# Patient Record
Sex: Male | Born: 1970 | Race: White | Hispanic: No | Marital: Married | Smoking: Current every day smoker
Health system: Southern US, Community
[De-identification: ages and names within clinical notes are randomized; demographics above are authoritative.]

## PROBLEM LIST (undated history)

## (undated) DIAGNOSIS — K7031 Alcoholic cirrhosis of liver with ascites: Principal | ICD-10-CM

## (undated) DIAGNOSIS — K439 Ventral hernia without obstruction or gangrene: Secondary | ICD-10-CM

## (undated) DIAGNOSIS — K430 Incisional hernia with obstruction, without gangrene: Secondary | ICD-10-CM

## (undated) DIAGNOSIS — R140 Abdominal distension (gaseous): Secondary | ICD-10-CM

## (undated) DIAGNOSIS — S83206A Unspecified tear of unspecified meniscus, current injury, right knee, initial encounter: Secondary | ICD-10-CM

## (undated) DIAGNOSIS — K219 Gastro-esophageal reflux disease without esophagitis: Secondary | ICD-10-CM

## (undated) DIAGNOSIS — G8929 Other chronic pain: Secondary | ICD-10-CM

## (undated) DIAGNOSIS — M549 Dorsalgia, unspecified: Secondary | ICD-10-CM

## (undated) DIAGNOSIS — K589 Irritable bowel syndrome without diarrhea: Secondary | ICD-10-CM

## (undated) DIAGNOSIS — N200 Calculus of kidney: Secondary | ICD-10-CM

## (undated) HISTORY — PX: LEG SURGERY: SHX1003

## (undated) HISTORY — PX: WISDOM TOOTH EXTRACTION: SHX21

## (undated) HISTORY — PX: LITHOTRIPSY: SUR834

---

## 2011-12-30 ENCOUNTER — Emergency Department (HOSPITAL_BASED_OUTPATIENT_CLINIC_OR_DEPARTMENT_OTHER)
Admission: EM | Admit: 2011-12-30 | Discharge: 2011-12-30 | Disposition: A | Discharge status: Home / Self Care | Payer: Self-pay | Attending: Emergency Medicine | Admitting: Emergency Medicine

## 2011-12-30 ENCOUNTER — Encounter (HOSPITAL_BASED_OUTPATIENT_CLINIC_OR_DEPARTMENT_OTHER): Payer: Self-pay | Admitting: *Deleted

## 2011-12-30 DIAGNOSIS — F17200 Nicotine dependence, unspecified, uncomplicated: Secondary | ICD-10-CM | POA: Insufficient documentation

## 2011-12-30 DIAGNOSIS — Z77098 Contact with and (suspected) exposure to other hazardous, chiefly nonmedicinal, chemicals: Secondary | ICD-10-CM | POA: Insufficient documentation

## 2011-12-30 MED ORDER — TETRACAINE HCL 0.5 % OP SOLN
OPHTHALMIC | Status: AC
  Administered 2011-12-30: 19:00:00
  Filled 2011-12-30: qty 2

## 2011-12-30 MED ORDER — OXYCODONE-ACETAMINOPHEN 5-325 MG PO TABS: 2.0000 | ORAL_TABLET | ORAL | Status: DC | PRN

## 2011-12-30 MED ORDER — FLUORESCEIN SODIUM 1 MG OP STRP
ORAL_STRIP | OPHTHALMIC | Status: AC
  Administered 2011-12-30: 18:00:00
  Filled 2011-12-30: qty 1

## 2011-12-30 MED ORDER — OXYCODONE-ACETAMINOPHEN 5-325 MG PO TABS: 2.0000 | ORAL_TABLET | ORAL | Status: AC | PRN

## 2011-12-30 MED ORDER — TOBRAMYCIN 0.3 % OP SOLN: 1.0000 [drp] | OPHTHALMIC | Status: AC

## 2011-12-30 MED ORDER — FLUORESCEIN SODIUM 1 MG OP STRP
ORAL_STRIP | OPHTHALMIC | Status: AC
  Administered 2011-12-30: 20:00:00
  Filled 2011-12-30: qty 1

## 2011-12-30 MED ORDER — TETRACAINE HCL 0.5 % OP SOLN
OPHTHALMIC | Status: AC
  Administered 2011-12-30: 18:00:00 via OPHTHALMIC
  Filled 2011-12-30: qty 2

## 2011-12-30 MED ORDER — OXYCODONE-ACETAMINOPHEN 5-325 MG PO TABS
1.0000 | ORAL_TABLET | Freq: Once | ORAL | Status: AC
Start: 2011-12-30 — End: 2011-12-30
  Administered 2011-12-30: 1 via ORAL
  Filled 2011-12-30 (×2): qty 1

## 2011-12-30 NOTE — ED Notes (Signed)
Patient's left eye being flushed with morgan lens and one liter NaCl 0.9%

## 2011-12-30 NOTE — ED Notes (Signed)
Pt. Eating and states he feels much better after pain med. Still with c/o mild  blurred vision to left eye.

## 2011-12-30 NOTE — ED Notes (Signed)
Eye irrigation complete. PA aware and request another visual acuity. In progress per the EMT. Tolerated procedure well.
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
## 2011-12-30 NOTE — ED Notes (Signed)
PA at bedside.

## 2011-12-30 NOTE — ED Notes (Signed)
MD at bedside. 

## 2011-12-30 NOTE — ED Notes (Signed)
Pa @ bs.
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
## 2011-12-31 NOTE — ED Provider Notes (Signed)
Medical screening examination/treatment/procedure(s) were conducted as a shared visit with non-physician practitioner(s) and myself.  I personally evaluated the patient during the encounter.    Obtained hx and performed eye exam.  No FBs noted.  No corneal abrasions noted on slit lamp exam or with fluorescein staining.  Discussed his evaluation with the on-call eye specialist.  He will follow-up in < 24hrs for further evaluation.  Tobin Chad, MD 12/31/11 [phone]

## 2012-01-29 ENCOUNTER — Emergency Department (HOSPITAL_BASED_OUTPATIENT_CLINIC_OR_DEPARTMENT_OTHER)
Admission: EM | Admit: 2012-01-29 | Discharge: 2012-01-29 | Disposition: A | Discharge status: Home / Self Care | Payer: Self-pay | Attending: Emergency Medicine | Admitting: Emergency Medicine

## 2012-01-29 ENCOUNTER — Emergency Department (HOSPITAL_BASED_OUTPATIENT_CLINIC_OR_DEPARTMENT_OTHER): Payer: Self-pay

## 2012-01-29 ENCOUNTER — Encounter (HOSPITAL_BASED_OUTPATIENT_CLINIC_OR_DEPARTMENT_OTHER): Payer: Self-pay

## 2012-01-29 DIAGNOSIS — Z880 Allergy status to penicillin: Secondary | ICD-10-CM | POA: Insufficient documentation

## 2012-01-29 DIAGNOSIS — Y9355 Activity, bike riding: Secondary | ICD-10-CM | POA: Insufficient documentation

## 2012-01-29 DIAGNOSIS — S92309A Fracture of unspecified metatarsal bone(s), unspecified foot, initial encounter for closed fracture: Secondary | ICD-10-CM | POA: Insufficient documentation

## 2012-01-29 DIAGNOSIS — F17200 Nicotine dependence, unspecified, uncomplicated: Secondary | ICD-10-CM | POA: Insufficient documentation

## 2012-01-29 MED ORDER — OXYCODONE-ACETAMINOPHEN 5-325 MG PO TABS
2.0000 | ORAL_TABLET | Freq: Once | ORAL | Status: AC
  Administered 2012-01-29: 2 via ORAL
  Filled 2012-01-29 (×2): qty 2

## 2012-01-29 MED ORDER — OXYCODONE-ACETAMINOPHEN 5-325 MG PO TABS
1.0000 | ORAL_TABLET | Freq: Four times a day (QID) | ORAL | Status: DC | PRN
Start: 2012-01-29 — End: 2012-05-11

## 2012-01-29 MED ORDER — HYDROCODONE-ACETAMINOPHEN 5-325 MG PO TABS
1.0000 | ORAL_TABLET | Freq: Four times a day (QID) | ORAL | Status: DC | PRN
Start: 2012-01-29 — End: 2012-01-29

## 2012-01-29 MED ORDER — HYDROCODONE-ACETAMINOPHEN 5-325 MG PO TABS
1.0000 | ORAL_TABLET | Freq: Once | ORAL | Status: DC
  Filled 2012-01-29: qty 1
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
## 2012-03-31 ENCOUNTER — Emergency Department (HOSPITAL_BASED_OUTPATIENT_CLINIC_OR_DEPARTMENT_OTHER)
Admission: EM | Admit: 2012-03-31 | Discharge: 2012-03-31 | Disposition: A | Discharge status: Home / Self Care | Payer: Self-pay | Attending: Emergency Medicine | Admitting: Emergency Medicine

## 2012-03-31 ENCOUNTER — Encounter (HOSPITAL_BASED_OUTPATIENT_CLINIC_OR_DEPARTMENT_OTHER): Payer: Self-pay | Admitting: *Deleted

## 2012-03-31 DIAGNOSIS — Z0289 Encounter for other administrative examinations: Secondary | ICD-10-CM | POA: Insufficient documentation

## 2012-03-31 DIAGNOSIS — S92909A Unspecified fracture of unspecified foot, initial encounter for closed fracture: Secondary | ICD-10-CM

## 2012-03-31 DIAGNOSIS — K219 Gastro-esophageal reflux disease without esophagitis: Secondary | ICD-10-CM | POA: Insufficient documentation

## 2012-03-31 DIAGNOSIS — M549 Dorsalgia, unspecified: Secondary | ICD-10-CM | POA: Insufficient documentation

## 2012-03-31 DIAGNOSIS — M2540 Effusion, unspecified joint: Secondary | ICD-10-CM | POA: Insufficient documentation

## 2012-03-31 DIAGNOSIS — G8929 Other chronic pain: Secondary | ICD-10-CM | POA: Insufficient documentation

## 2012-03-31 DIAGNOSIS — Z8719 Personal history of other diseases of the digestive system: Secondary | ICD-10-CM | POA: Insufficient documentation

## 2012-03-31 DIAGNOSIS — Z79899 Other long term (current) drug therapy: Secondary | ICD-10-CM | POA: Insufficient documentation

## 2012-03-31 DIAGNOSIS — F17200 Nicotine dependence, unspecified, uncomplicated: Secondary | ICD-10-CM | POA: Insufficient documentation

## 2012-03-31 HISTORY — DX: Dorsalgia, unspecified: M54.9

## 2012-03-31 HISTORY — DX: Other chronic pain: G89.29

## 2012-03-31 HISTORY — DX: Gastro-esophageal reflux disease without esophagitis: K21.9

## 2012-03-31 HISTORY — DX: Irritable bowel syndrome, unspecified: K58.9

## 2012-03-31 NOTE — ED Provider Notes (Signed)
History     CSN: [id number]  Arrival date & time 03/31/12  1001   First MD Initiated Contact with Patient 03/31/12 1025      Chief Complaint  Patient presents with  . Letter for School/Work    (Consider location/radiation/quality/duration/timing/severity/associated sxs/prior treatment) HPI Comments: Patient presents status post a left foot fracture stating that he needs a note to go back to work. He states he was seen here on October 15 and diagnosed with metatarsal fractures. He was placed in a postop boot and was instructed to follow up with orthopedics. However he states that orthopedics within $400 to see him and he was not able to get an appointment with him. He states that he's been wearing the boot until this past Saturday. He states that since she's taken the boot off he feels like his foot is doing fine. He denies any pain on ambulation. He denies any swelling to the foot. He feels like it's completely healed. He denies any numbness in the foot. He states that his job needs a note he can go back to work.   Past Medical History  Diagnosis Date  . Acid reflux   . IBS (irritable bowel syndrome)   . Chronic back pain     History reviewed. No pertinent past surgical history.  History reviewed. No pertinent family history.  History  Substance Use Topics  . Smoking status: Current Every Day Smoker  . Smokeless tobacco: Not on file  . Alcohol Use: No      Review of Systems  Constitutional: Negative for fever.  HENT: Negative for neck pain.   Respiratory: Negative.   Cardiovascular: Negative.   Gastrointestinal: Negative for nausea and vomiting.  Musculoskeletal: Positive for joint swelling. Negative for back pain.  Skin: Negative for wound.  Neurological: Negative for headaches.    Allergies  Penicillins and Vicodin  Home Medications   Current Outpatient Rx  Name  Route  Sig  Dispense  Refill  . ESOMEPRAZOLE MAGNESIUM 40 MG PO CPDR   Oral   Take 40 mg by  mouth daily before breakfast.         . IBUPROFEN 200 MG PO TABS   Oral   Take 200 mg by mouth every 6 (six) hours as needed. For headache.         . OXYCODONE HCL ER 10 MG PO TB12   Oral   Take 10 mg by mouth every 12 (twelve) hours.         . OXYCODONE-ACETAMINOPHEN 5-325 MG PO TABS   Oral   Take 1-2 tablets by mouth every 6 (six) hours as needed for pain.   20 tablet   0     BP 142/90  Pulse 83  Temp 97.9 F (36.6 C) (Oral)  Resp 18  Ht 6\' 1"  (1.854 m)  Wt 276 lb (125.193 kg)  BMI 36.41 kg/m2  SpO2 98%  Physical Exam  Constitutional: He is oriented to person, place, and time. He appears well-developed and well-nourished.  HENT:  Head: Normocephalic and atraumatic.  Eyes: Pupils are equal, round, and reactive to light.  Neck: Normal range of motion. Neck supple.  Cardiovascular: Normal rate, regular rhythm and normal heart sounds.   Pulmonary/Chest: Effort normal and breath sounds normal. No respiratory distress. He has no wheezes. He has no rales. He exhibits no tenderness.  Abdominal: Soft. Bowel sounds are normal. There is no tenderness. There is no rebound and no guarding.  Musculoskeletal: Normal range of motion.  He exhibits no edema.       Patient has no swelling noted to the foot. He has no pain on palpation or range of motion of the foot. He has normal pulses in the feet. He has normal sensation and motor function in the foot.  Lymphadenopathy:    He has no cervical adenopathy.  Neurological: He is alert and oriented to person, place, and time.  Skin: Skin is warm and dry. No rash noted.  Psychiatric: He has a normal mood and affect.    ED Course  Procedures (including critical care time)  Labs Reviewed - No data to display No results found.   1. Foot fracture       MDM  Patient is here for a reevaluation after being  diagnosed with metatarsal fractures. He appears to be doing well and has no pain with ambulation or ongoing swelling. I did  give him a note he can go back to work. I advised him that he'll need followup with his primary care physician if his symptoms start returning or if he has any ongoing problems with this foot. He has a primary care physician in [facility].        Rolan Bucco, MD 03/31/12 1056
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
## 2012-05-11 ENCOUNTER — Emergency Department (HOSPITAL_COMMUNITY): Payer: Self-pay

## 2012-05-11 ENCOUNTER — Encounter (HOSPITAL_COMMUNITY): Payer: Self-pay | Admitting: Emergency Medicine

## 2012-05-11 ENCOUNTER — Emergency Department (HOSPITAL_COMMUNITY)
Admission: EM | Admit: 2012-05-11 | Discharge: 2012-05-11 | Disposition: A | Discharge status: Home / Self Care | Payer: Self-pay | Attending: Emergency Medicine | Admitting: Emergency Medicine

## 2012-05-11 DIAGNOSIS — Z8719 Personal history of other diseases of the digestive system: Secondary | ICD-10-CM | POA: Insufficient documentation

## 2012-05-11 DIAGNOSIS — S32010A Wedge compression fracture of first lumbar vertebra, initial encounter for closed fracture: Secondary | ICD-10-CM

## 2012-05-11 DIAGNOSIS — Z79899 Other long term (current) drug therapy: Secondary | ICD-10-CM | POA: Insufficient documentation

## 2012-05-11 DIAGNOSIS — M7918 Myalgia, other site: Secondary | ICD-10-CM

## 2012-05-11 DIAGNOSIS — F17200 Nicotine dependence, unspecified, uncomplicated: Secondary | ICD-10-CM | POA: Insufficient documentation

## 2012-05-11 DIAGNOSIS — W108XXA Fall (on) (from) other stairs and steps, initial encounter: Secondary | ICD-10-CM | POA: Insufficient documentation

## 2012-05-11 DIAGNOSIS — S32009A Unspecified fracture of unspecified lumbar vertebra, initial encounter for closed fracture: Secondary | ICD-10-CM | POA: Insufficient documentation

## 2012-05-11 DIAGNOSIS — Y9389 Activity, other specified: Secondary | ICD-10-CM | POA: Insufficient documentation

## 2012-05-11 DIAGNOSIS — G8929 Other chronic pain: Secondary | ICD-10-CM | POA: Insufficient documentation

## 2012-05-11 DIAGNOSIS — M549 Dorsalgia, unspecified: Secondary | ICD-10-CM | POA: Insufficient documentation

## 2012-05-11 DIAGNOSIS — R51 Headache: Secondary | ICD-10-CM | POA: Insufficient documentation

## 2012-05-11 DIAGNOSIS — Y9289 Other specified places as the place of occurrence of the external cause: Secondary | ICD-10-CM | POA: Insufficient documentation

## 2012-05-11 DIAGNOSIS — K219 Gastro-esophageal reflux disease without esophagitis: Secondary | ICD-10-CM | POA: Insufficient documentation

## 2012-05-11 MED ORDER — PROMETHAZINE HCL 25 MG PO TABS: 25.0000 mg | ORAL_TABLET | Freq: Four times a day (QID) | ORAL | Status: DC | PRN

## 2012-05-11 MED ORDER — HYDROMORPHONE HCL PF 1 MG/ML IJ SOLN
1.0000 mg | Freq: Once | INTRAMUSCULAR | Status: AC
  Administered 2012-05-11: 1 mg via INTRAVENOUS
  Filled 2012-05-11: qty 1

## 2012-05-11 MED ORDER — OXYCODONE HCL 5 MG PO TABS: 5.0000 mg | ORAL_TABLET | ORAL | Status: DC | PRN

## 2012-05-11 MED ORDER — ONDANSETRON HCL 4 MG/2ML IJ SOLN
4.0000 mg | Freq: Once | INTRAMUSCULAR | Status: AC
  Administered 2012-05-11: 4 mg via INTRAVENOUS
  Filled 2012-05-11: qty 2

## 2012-05-11 MED ORDER — PROMETHAZINE HCL 25 MG/ML IJ SOLN
12.5000 mg | Freq: Once | INTRAMUSCULAR | Status: DC
  Filled 2012-05-11 (×2): qty 1

## 2012-05-11 MED ORDER — HYDROMORPHONE HCL PF 1 MG/ML IJ SOLN
0.5000 mg | Freq: Once | INTRAMUSCULAR | Status: AC
  Administered 2012-05-11: 0.5 mg via INTRAVENOUS
  Filled 2012-05-11: qty 1

## 2012-05-11 NOTE — ED Notes (Signed)
Pt returned from CT and XR.

## 2012-05-11 NOTE — ED Notes (Signed)
Patient transported to X-ray 
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
## 2012-05-11 NOTE — ED Notes (Signed)
Patient transported to CT 

## 2012-05-11 NOTE — ED Notes (Signed)
Pt arrived by EMS with c/o fall down 6 steps and landed on carpet. Pt c/o right shoulder, arm, back and neck pain. Pt stated that he passed out after hitting the floor. EMS VS: BP-124/92 HR-84 RESP-16 [id number]. Pt was nauseated en route and EMS admin Zofran 4mg  IV.

## 2012-05-12 NOTE — ED Provider Notes (Signed)
Medical screening examination/treatment/procedure(s) were performed by non-physician practitioner and as supervising physician I was immediately available for consultation/collaboration.   Benny Lennert, MD 05/12/12 1236

## 2013-09-13 ENCOUNTER — Emergency Department (HOSPITAL_BASED_OUTPATIENT_CLINIC_OR_DEPARTMENT_OTHER): Payer: Medicaid Other

## 2013-09-13 ENCOUNTER — Encounter (HOSPITAL_BASED_OUTPATIENT_CLINIC_OR_DEPARTMENT_OTHER): Payer: Self-pay | Admitting: Emergency Medicine

## 2013-09-13 ENCOUNTER — Emergency Department (HOSPITAL_BASED_OUTPATIENT_CLINIC_OR_DEPARTMENT_OTHER)
Admission: EM | Admit: 2013-09-13 | Discharge: 2013-09-13 | Disposition: A | Discharge status: Home / Self Care | Payer: Medicaid Other | Attending: Emergency Medicine | Admitting: Emergency Medicine

## 2013-09-13 DIAGNOSIS — G8929 Other chronic pain: Secondary | ICD-10-CM | POA: Insufficient documentation

## 2013-09-13 DIAGNOSIS — Z79899 Other long term (current) drug therapy: Secondary | ICD-10-CM | POA: Insufficient documentation

## 2013-09-13 DIAGNOSIS — Z880 Allergy status to penicillin: Secondary | ICD-10-CM | POA: Insufficient documentation

## 2013-09-13 DIAGNOSIS — Y9289 Other specified places as the place of occurrence of the external cause: Secondary | ICD-10-CM | POA: Insufficient documentation

## 2013-09-13 DIAGNOSIS — Y9389 Activity, other specified: Secondary | ICD-10-CM | POA: Insufficient documentation

## 2013-09-13 DIAGNOSIS — M25469 Effusion, unspecified knee: Secondary | ICD-10-CM | POA: Insufficient documentation

## 2013-09-13 DIAGNOSIS — M2390 Unspecified internal derangement of unspecified knee: Secondary | ICD-10-CM

## 2013-09-13 DIAGNOSIS — M549 Dorsalgia, unspecified: Secondary | ICD-10-CM | POA: Insufficient documentation

## 2013-09-13 DIAGNOSIS — F17200 Nicotine dependence, unspecified, uncomplicated: Secondary | ICD-10-CM | POA: Insufficient documentation

## 2013-09-13 DIAGNOSIS — X500XXA Overexertion from strenuous movement or load, initial encounter: Secondary | ICD-10-CM | POA: Insufficient documentation

## 2013-09-13 DIAGNOSIS — S8990XA Unspecified injury of unspecified lower leg, initial encounter: Secondary | ICD-10-CM | POA: Insufficient documentation

## 2013-09-13 DIAGNOSIS — S99919A Unspecified injury of unspecified ankle, initial encounter: Principal | ICD-10-CM

## 2013-09-13 DIAGNOSIS — S99929A Unspecified injury of unspecified foot, initial encounter: Principal | ICD-10-CM

## 2013-09-13 DIAGNOSIS — K219 Gastro-esophageal reflux disease without esophagitis: Secondary | ICD-10-CM | POA: Insufficient documentation

## 2013-09-13 MED ORDER — HYDROMORPHONE HCL PF 1 MG/ML IJ SOLN
1.0000 mg | Freq: Once | INTRAMUSCULAR | Status: DC
  Filled 2013-09-13: qty 1

## 2013-09-13 MED ORDER — IBUPROFEN 800 MG PO TABS: 800.0000 mg | ORAL_TABLET | Freq: Three times a day (TID) | ORAL | Status: DC

## 2013-09-13 MED ORDER — IBUPROFEN 800 MG PO TABS
800.0000 mg | ORAL_TABLET | Freq: Once | ORAL | Status: AC
  Administered 2013-09-13: 800 mg via ORAL
  Filled 2013-09-13: qty 1

## 2013-09-13 NOTE — ED Provider Notes (Signed)
CSN: [id number]     Arrival date & time 09/13/13  2018 History  This chart was scribed for Peter Octave, MD by Modena Jansky, ED Scribe. This patient was seen in room MH03/MH03 and the patient's care was started at 9:02 PM.  Chief Complaint  Patient presents with  . Knee Pain   The history is provided by the patient. No language interpreter was used.   HPI Comments: Peter Leon is a 43 y.o. male who presents to the Emergency Department complaining of intermittent, gradually worsening, severe left knee pain with associated swelling that started a week ago. Pt reports twisting left leg on ladder. He describes the pain as stabbing, and is worsened by palpation and movement. He has taken Roxicodone10 mg, which he takes for chronic back pain,  with no relief. He has also tried ice packs and massage with no relief. He also intermittent numbness in his right foot. Pt has had remote hx of right knee patellar dislocation in the past and previous ACL repair. Denies headache, back pain, neck pain, and LOC.   Past Medical History  Diagnosis Date  . Acid reflux   . IBS (irritable bowel syndrome)   . Chronic back pain    Past Surgical History  Procedure Laterality Date  . Leg surgery      compound fx    History reviewed. No pertinent family history. History  Substance Use Topics  . Smoking status: Current Every Day Smoker -- 1.00 packs/day  . Smokeless tobacco: Not on file  . Alcohol Use: No    Review of Systems A complete 10 system review of systems was obtained and all systems are negative except as noted in the HPI and PMH.   Allergies  Penicillins; Acetaminophen; Morphine and related; and Vicodin  Home Medications   Prior to Admission medications   Medication Sig Start Date End Date Taking? Authorizing Provider  esomeprazole (NEXIUM) 40 MG capsule Take 40 mg by mouth daily before breakfast.    Historical Provider, MD  ibuprofen (ADVIL,MOTRIN) 800 MG tablet Take 1 tablet (800 mg  total) by mouth 3 (three) times daily. 09/13/13   Peter Octave, MD  oxyCODONE (OXYCONTIN) 10 MG 12 hr tablet Take 10 mg by mouth every 12 (twelve) hours.    Historical Provider, MD  oxyCODONE (ROXICODONE) 5 MG immediate release tablet Take 1 tablet (5 mg total) by mouth every 4 (four) hours as needed for pain. 05/11/12   Nicole Pisciotta, PA-C  promethazine (PHENERGAN) 25 MG tablet Take 1 tablet (25 mg total) by mouth every 6 (six) hours as needed for nausea. 05/11/12   Nicole Pisciotta, PA-C  vitamin B-12 (CYANOCOBALAMIN) 100 MCG tablet Take 100 mcg by mouth daily.    Historical Provider, MD   BP 146/96  Pulse 66  Temp(Src) 98.8 F (37.1 C) (Oral)  Resp 16  Wt 260 lb (117.935 kg)  SpO2 98% Physical Exam  Nursing note and vitals reviewed. Constitutional: He is oriented to person, place, and time. He appears well-developed and well-nourished. No distress.  Angry disposition, texting on phone when I entered the room, continues to text even after I asked him to stop.   HENT:  Head: Normocephalic and atraumatic.  Eyes: Conjunctivae and EOM are normal. Pupils are equal, round, and reactive to light.  Neck: Normal range of motion. Neck supple. No tracheal deviation present.  Cardiovascular: Normal rate, regular rhythm, normal heart sounds and intact distal pulses.   No murmur heard. Pulmonary/Chest: Effort normal. No respiratory distress.  Abdominal: Soft. There is no tenderness. There is no rebound.  Musculoskeletal: He exhibits edema and tenderness.  Right knee- no erythema. Small effusion. Reduced ROM secondary to pain. Flexion and extension are intact. Tenderness over anterior medial joint line. Achilles intact. No ligament laxity. +2 DP and PT pulses.   Neurological: He is alert and oriented to person, place, and time. No cranial nerve deficit. He exhibits normal muscle tone. Coordination normal.  Skin: Skin is warm and dry.  Psychiatric: He has a normal mood and affect. His behavior is  normal.    ED Course  Procedures (including critical care time) DIAGNOSTIC STUDIES: Oxygen Saturation is 98% on RA, normal by my interpretation.    COORDINATION OF CARE: 9:06 PM- Pt advised of plan for treatment which include motrin and knee X-ray and pt agrees.  Labs Review Labs Reviewed - No data to display  Imaging Review Dg Knee Complete 4 Views Right  09/13/2013   CLINICAL DATA:  Twisted right knee 1 week ago.  Pain and swelling.  EXAM: RIGHT KNEE - COMPLETE 4+ VIEW  COMPARISON:  None.  FINDINGS: No fracture. The knee joint is normally spaced and aligned with no significant arthropathic change.  Moderate joint effusion.  There is a piece of metal wire in the posterior soft tissues of the distal thigh.  IMPRESSION: No fracture dislocation. Moderate joint effusion suggests an internal derangement. Recommend followup MRI.  Small metal piece of wire in the posterior soft tissues, which is likely chronic. This is not a contraindication to MRI.   Electronically Signed   By: Amie Portlandavid  Ormond M.D.   On: 09/13/2013 21:06     EKG Interpretation None      MDM   Final diagnoses:  Knee internal derangement   Right knee pain and swelling after twisting it one week ago while getting off a ladder. He is able to ambulate. He takes chronic oxycodone for back pain. States this is not helping. Denies any fever or vomiting.  On exam patient has an effusion with reduced range of motion. There is no warmth or erythema. Flexion and extension are intact. Tenderness over the anterior medial joint line.  There is no warmth or erythema. Patient is afebrile. Doubt septic joint. his range of motion is intact but limited.  X-ray shows moderate joint effusion. Patient will need MRI for evaluation of internal derangement. He declines pain medication in the ED. He has his chronic pain medication at home. Follow up with sports medicine, knee immobilizer and crutches given.  I personally performed the services  described in this documentation, which was scribed in my presence. The recorded information has been reviewed and is accurate.   Peter OctaveStephen Adreonna Yontz, MD 09/13/13 [phone]

## 2013-09-13 NOTE — Discharge Instructions (Signed)
Knee Effusion Follow up with Dr. Pearletha Forge. Take your pain medications as prescribed. Return to the ED if you develop new or worsening symptoms,.  Knee effusion means you have fluid in your knee. The knee may be more difficult to bend and move. HOME CARE  Use crutches or a brace as told by your doctor.  Put ice on the injured area.  Put ice in a plastic bag.  Place a towel between your skin and the bag.  Leave the ice on for 15-20 minutes, 03-04 times a day.  Raise (elevate) your knee as much as possible.  Only take medicine as told by your doctor.  You may need to do strengthening exercises. Ask your doctor.  Continue with your normal diet and activities as told by your doctor. GET HELP RIGHT AWAY IF:  You have more puffiness (swelling) in your knee.  You see redness, puffiness, or have more pain in your knee.  You have a temperature by mouth above 102 F (38.9 C).  You get a rash.  You have trouble breathing.  You have a reaction to any medicine you are taking.  You have a lot of pain when you move your knee. MAKE SURE YOU:  Understand these instructions.  Will watch your condition.  Will get help right away if you are not doing well or get worse. Document Released: 05/05/2010 Document Revised: 06/25/2011 Document Reviewed: 05/05/2010 [facility] Patient Information 2014 [place], [place].

## 2013-09-13 NOTE — ED Notes (Signed)
D/c home with family- ice pack given for home use 

## 2013-09-13 NOTE — ED Notes (Signed)
Pt reports he twisted right knee one week ago pain  and swelling has progressively increased

## 2013-09-13 NOTE — ED Notes (Addendum)
Patient stated he was told that he will also get a knee sleeve under knee immobilizer so that he can go to work. I applied same, large knee sleeve, then a knee immobilizer. I fit crutches to patient and had him demonstrate proper use. Patient did well with no assistance. Comfort improved.

## 2013-11-17 ENCOUNTER — Emergency Department (HOSPITAL_COMMUNITY): Payer: Medicaid Other

## 2013-11-17 ENCOUNTER — Encounter (HOSPITAL_COMMUNITY): Payer: Self-pay | Admitting: Emergency Medicine

## 2013-11-17 ENCOUNTER — Emergency Department (HOSPITAL_COMMUNITY)
Admission: EM | Admit: 2013-11-17 | Discharge: 2013-11-17 | Disposition: A | Discharge status: Home / Self Care | Payer: Medicaid Other | Attending: Emergency Medicine | Admitting: Emergency Medicine

## 2013-11-17 DIAGNOSIS — G8911 Acute pain due to trauma: Secondary | ICD-10-CM | POA: Insufficient documentation

## 2013-11-17 DIAGNOSIS — G8929 Other chronic pain: Secondary | ICD-10-CM | POA: Diagnosis not present

## 2013-11-17 DIAGNOSIS — M25561 Pain in right knee: Secondary | ICD-10-CM

## 2013-11-17 DIAGNOSIS — F17200 Nicotine dependence, unspecified, uncomplicated: Secondary | ICD-10-CM | POA: Insufficient documentation

## 2013-11-17 DIAGNOSIS — Z8781 Personal history of (healed) traumatic fracture: Secondary | ICD-10-CM | POA: Diagnosis not present

## 2013-11-17 DIAGNOSIS — K219 Gastro-esophageal reflux disease without esophagitis: Secondary | ICD-10-CM | POA: Insufficient documentation

## 2013-11-17 DIAGNOSIS — T1490XA Injury, unspecified, initial encounter: Secondary | ICD-10-CM

## 2013-11-17 DIAGNOSIS — Z880 Allergy status to penicillin: Secondary | ICD-10-CM | POA: Diagnosis not present

## 2013-11-17 DIAGNOSIS — Z79899 Other long term (current) drug therapy: Secondary | ICD-10-CM | POA: Diagnosis not present

## 2013-11-17 DIAGNOSIS — M25569 Pain in unspecified knee: Secondary | ICD-10-CM | POA: Insufficient documentation

## 2013-11-17 DIAGNOSIS — M25469 Effusion, unspecified knee: Secondary | ICD-10-CM | POA: Diagnosis not present

## 2013-11-17 NOTE — ED Notes (Signed)
Pt hurt knee several weeks ago, was seen at [facility] after accident. Pt states pain and swelling has gotten worse. Pt ambulatory. R medial knee swollen.

## 2013-11-17 NOTE — ED Provider Notes (Signed)
CSN: [id number]     Arrival date & time 11/17/13  1532 History   None   This chart was scribed for non-physician practitioner, Junius Finner, working with Rolland Porter, MD by Marica Otter, ED Scribe. This patient was seen in room WTR7/WTR7 and the patient's care was started at 4:44 PM.  Chief Complaint  Patient presents with  . Knee Injury   HPI HPI Comments: Peter Leon is a 43 y.o. male, with a Hx of chronic pain (though pt notes that he does not take any pain meds presently), ACL tear, and leg surgery for a compound fracture, who presents to the Emergency Department complaining of worsening pain and swelling to his right knee which he injured back in May 2015. Pt rates his current pain a 5 out of 10. PT reports that he has been applying ice and heat to the affected area. Pt denies following up with an orthopedist as he gas been "working a lot". Pt denies any Hx of being shot with a BB gun or other known injury with metalic objects.. Denies new injuries.    Pt is a current everyday smoker who smokes 1 ppd.    Past Medical History  Diagnosis Date  . Acid reflux   . IBS (irritable bowel syndrome)   . Chronic back pain    Past Surgical History  Procedure Laterality Date  . Leg surgery      compound fx    History reviewed. No pertinent family history. History  Substance Use Topics  . Smoking status: Current Every Day Smoker -- 1.00 packs/day  . Smokeless tobacco: Not on file  . Alcohol Use: No    Review of Systems  Musculoskeletal:       Right knee pain and swelling   All other systems reviewed and are negative.     Allergies  Penicillins; Acetaminophen; Morphine and related; and Vicodin  Home Medications   Prior to Admission medications   Medication Sig Start Date End Date Taking? Authorizing Provider  esomeprazole (NEXIUM) 40 MG capsule Take 40 mg by mouth daily before breakfast.   Yes Historical Provider, MD  ibuprofen (ADVIL,MOTRIN) 800 MG tablet Take 1 tablet (800  mg total) by mouth 3 (three) times daily. 09/13/13  Yes [name], MD  naproxen sodium (ANAPROX) 220 MG tablet Take 440 mg by mouth every 4 (four) hours as needed (knee pain.).   Yes Historical Provider, MD  promethazine (PHENERGAN) 25 MG tablet Take 1 tablet (25 mg total) by mouth every 6 (six) hours as needed for nausea. 05/11/12  Yes Nicole Pisciotta, PA-C  vitamin B-12 (CYANOCOBALAMIN) 100 MCG tablet Take 100 mcg by mouth daily.   Yes Historical Provider, MD   Triage Vitals: BP 109/80  Pulse 79  Temp(Src) 98.3 F (36.8 C) (Oral)  Resp 16  SpO2 93% Physical Exam  Nursing note and vitals reviewed. Constitutional: He is oriented to person, place, and time. He appears well-developed and well-nourished.  HENT:  Head: Normocephalic and atraumatic.  Eyes: EOM are normal.  Neck: Normal range of motion.  Cardiovascular: Normal rate.   Pulses:      Dorsalis pedis pulses are 2+ on the right side.       Posterior tibial pulses are 2+ on the right side.  Pulmonary/Chest: Effort normal.  Musculoskeletal: Normal range of motion. He exhibits edema and tenderness.  Mild to moderate edema of right knee, greater edema in medial aspect of right knee. FROM. Tenderness over medial aspect right knee over MCL  and medial joint space.  No masses palpated.   Neurological: He is alert and oriented to person, place, and time.  Sensation in tact distal to injury.  Skin: Skin is warm and dry.  Psychiatric: He has a normal mood and affect. His behavior is normal.    ED Course  Procedures (including critical care time) DIAGNOSTIC STUDIES: Oxygen Saturation is 93% on RA, adequate by my interpretation.    COORDINATION OF CARE: 4:48 PM-Discussed treatment plan which includes ortho referral with pt at bedside and pt agreed to plan.   Labs Review Labs Reviewed - No data to display  Imaging Review Dg Knee Complete 4 Views Right  11/17/2013   CLINICAL DATA:  Twisting injury, knee pain.  EXAM: RIGHT KNEE -  COMPLETE 4+ VIEW  COMPARISON:  Right knee radiograph Sep 13, 2013  FINDINGS: No acute fracture deformity or dislocation. Joint space intact without erosions. No destructive bony lesions. Again seen is a small wire fragment within the posterior distal femoral soft tissues. Small suprapatellar joint effusion.  IMPRESSION: No acute fracture deformity or dislocation. Small suprapatellar joint effusion, decreased.  Wire fragment projecting in the posterior oral distal femoral soft tissues, unchanged.   Electronically Signed   By: Awilda Metroourtnay  Bloomer   On: 11/17/2013 16:06     EKG Interpretation None      MDM   Final diagnoses:  Pain of right knee after injury    Pt is a 43yo male presenting to ED for further evaluation and treatment of continued right knee pain due to injury on 09/13/13.  Pt has not f/u with orthopedics as previously suggested. Today, pt requesting referral as symptoms have not improved. Denies new injuries. Right leg is neurovascularly in tact. Will discharge pt home to f/u with Dr. Sherlean FootLucey, orthopedics, for further evaluation and treatment of right knee injury.  Due to persistence of symptoms and edema, concern for ligamentous injury that may require surgical repair. Return precautions provided. Pt verbalized understanding and agreement with tx plan.   I personally performed the services described in this documentation, which was scribed in my presence. The recorded information has been reviewed and is accurate.    Junius Finnerrin O'Malley, PA-C 11/17/13 1943

## 2013-11-19 NOTE — ED Provider Notes (Signed)
Medical screening examination/treatment/procedure(s) were performed by non-physician practitioner and as supervising physician I was immediately available for consultation/collaboration.   EKG Interpretation None        Rolland PorterMark Keaundra Stehle, MD 11/19/13 [phone]

## 2014-09-14 ENCOUNTER — Encounter (HOSPITAL_COMMUNITY): Payer: Self-pay | Admitting: Emergency Medicine

## 2014-09-14 ENCOUNTER — Emergency Department (HOSPITAL_COMMUNITY): Payer: Medicaid Other

## 2014-09-14 ENCOUNTER — Emergency Department (HOSPITAL_COMMUNITY)
Admission: EM | Admit: 2014-09-14 | Discharge: 2014-09-14 | Disposition: A | Discharge status: Home / Self Care | Payer: Medicaid Other | Attending: Emergency Medicine | Admitting: Emergency Medicine

## 2014-09-14 DIAGNOSIS — Z720 Tobacco use: Secondary | ICD-10-CM | POA: Diagnosis not present

## 2014-09-14 DIAGNOSIS — K219 Gastro-esophageal reflux disease without esophagitis: Secondary | ICD-10-CM | POA: Diagnosis not present

## 2014-09-14 DIAGNOSIS — W11XXXA Fall on and from ladder, initial encounter: Secondary | ICD-10-CM | POA: Insufficient documentation

## 2014-09-14 DIAGNOSIS — Z79899 Other long term (current) drug therapy: Secondary | ICD-10-CM | POA: Insufficient documentation

## 2014-09-14 DIAGNOSIS — S8991XA Unspecified injury of right lower leg, initial encounter: Secondary | ICD-10-CM | POA: Insufficient documentation

## 2014-09-14 DIAGNOSIS — G8929 Other chronic pain: Secondary | ICD-10-CM | POA: Diagnosis not present

## 2014-09-14 DIAGNOSIS — Y998 Other external cause status: Secondary | ICD-10-CM | POA: Diagnosis not present

## 2014-09-14 DIAGNOSIS — M25461 Effusion, right knee: Secondary | ICD-10-CM

## 2014-09-14 DIAGNOSIS — Y9389 Activity, other specified: Secondary | ICD-10-CM | POA: Diagnosis not present

## 2014-09-14 DIAGNOSIS — Z880 Allergy status to penicillin: Secondary | ICD-10-CM | POA: Insufficient documentation

## 2014-09-14 DIAGNOSIS — Y9289 Other specified places as the place of occurrence of the external cause: Secondary | ICD-10-CM | POA: Diagnosis not present

## 2014-09-14 MED ORDER — IBUPROFEN 200 MG PO TABS
600.0000 mg | ORAL_TABLET | Freq: Once | ORAL | Status: AC
  Administered 2014-09-14: 600 mg via ORAL
  Filled 2014-09-14: qty 3

## 2014-09-14 MED ORDER — OXYCODONE HCL 5 MG PO TABS
5.0000 mg | ORAL_TABLET | Freq: Once | ORAL | Status: AC
  Administered 2014-09-14: 5 mg via ORAL
  Filled 2014-09-14: qty 1

## 2014-09-14 NOTE — ED Provider Notes (Signed)
CSN: [id number]     Arrival date & time 09/14/14  2009 History  This chart was scribed for a non-physician practitioner, Earley Favor, NP working with Lorre Nick, MD by Evon Slack, ED Scribe. This patient was seen in room WTR8/WTR8 and the patient's care was started at 9:54 PM.      Chief Complaint  Patient presents with  . Knee Injury    right   The history is provided by the patient. No language interpreter was used.   HPI Comments: Peter Leon is a 44 y.o. male who presents to the Emergency Department complaining of right knee injury onset today. Pt has associated swelling. Pt states that he fell of of a 6 foot ladder landing on his right foot and twisted his knee. He states that he heard the knee poop. Pt states that he has tried ibuprofen with no relief. Pt states that the pain is worse when ambulating or bearing weight. Pt denies numbness or other related symptoms.   Past Medical History  Diagnosis Date  . Acid reflux   . IBS (irritable bowel syndrome)   . Chronic back pain    Past Surgical History  Procedure Laterality Date  . Leg surgery      compound fx    History reviewed. No pertinent family history. History  Substance Use Topics  . Smoking status: Current Every Day Smoker -- 1.00 packs/day  . Smokeless tobacco: Not on file  . Alcohol Use: No    Review of Systems  Musculoskeletal: Positive for arthralgias.  Neurological: Negative for numbness.  All other systems reviewed and are negative.    Allergies  Penicillins; Acetaminophen; Morphine and related; and Vicodin  Home Medications   Prior to Admission medications   Medication Sig Start Date End Date Taking? Authorizing Provider  esomeprazole (NEXIUM) 40 MG capsule Take 40 mg by mouth daily before breakfast.   Yes Historical Provider, MD  ibuprofen (ADVIL,MOTRIN) 800 MG tablet Take 1 tablet (800 mg total) by mouth 3 (three) times daily. 09/13/13  Yes [name], MD  oxyCODONE (ROXICODONE) 15 MG  immediate release tablet Take 15 mg by mouth every 6 (six) hours as needed for pain (pain).   Yes Historical Provider, MD  vitamin B-12 (CYANOCOBALAMIN) 100 MCG tablet Take 100 mcg by mouth daily.   Yes Historical Provider, MD  promethazine (PHENERGAN) 25 MG tablet Take 1 tablet (25 mg total) by mouth every 6 (six) hours as needed for nausea. Patient not taking: Reported on 09/14/2014 05/11/12   Joni Reining Pisciotta, PA-C   BP 131/83 mmHg  Pulse 79  Temp(Src) 98.3 F (36.8 C) (Oral)  Resp 16  SpO2 100%   Physical Exam  Constitutional: He is oriented to person, place, and time. He appears well-developed and well-nourished. No distress.  HENT:  Head: Normocephalic and atraumatic.  Eyes: Conjunctivae and EOM are normal.  Neck: Neck supple. No tracheal deviation present.  Cardiovascular: Normal rate.   Pulmonary/Chest: Effort normal. No respiratory distress.  Musculoskeletal: Normal range of motion.  Neurological: He is alert and oriented to person, place, and time.  Skin: Skin is warm and dry.  Psychiatric: He has a normal mood and affect. His behavior is normal.  Nursing note and vitals reviewed.   ED Course  Procedures (including critical care time) DIAGNOSTIC STUDIES: Oxygen Saturation is 100% on RA, normal by my interpretation.    COORDINATION OF CARE: 10:33 PM-Discussed treatment plan with pt at bedside and pt agreed to plan.  Labs Review Labs Reviewed - No data to display  Imaging Review Dg Tibia/fibula Right  09/14/2014   CLINICAL DATA:  Right leg pain after injury. Pain after fall at ladder at work earlier today. Lateral pain radiating down the leg.  EXAM: RIGHT TIBIA AND FIBULA - 2 VIEW  COMPARISON:  None.  FINDINGS: Cortical margins of the tibia and fibula are intact. There is no fracture. Ankle alignment is maintained. No focal soft tissue abnormality.  IMPRESSION: No fracture of the right tibia/fibula.   Electronically Signed   By: Rubye OaksMelanie  Ehinger M.D.   On:  09/14/2014 22:41   Dg Knee Complete 4 Views Right  09/14/2014   CLINICAL DATA:  Right knee pain after injury. Fall off ladder earlier today at work. Lateral pain radiating down leg. Unable to bear weight.  EXAM: RIGHT KNEE - COMPLETE 4+ VIEW  COMPARISON:  11/17/2013  FINDINGS: No fracture or dislocation. The alignment is maintained. There is minimal medial tibial femoral joint space narrowing and peripheral spurring. Previous joint effusion has diminished. Unchanged 10 mm linear metallic density in the posterior distal thigh, likely small wire fragment.  IMPRESSION: 1. No fracture or dislocation. 2. 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   Electronically Signed   By: Rubye OaksMelanie  Ehinger M.D.   On: 09/14/2014 22:41     EKG Interpretation None     Is no fracture or subluxation.  Patient has been placed in a knee immobilizer, given crutches for his home medication.  It shows that he takes 15 mg of oxycodone every 6 hours for pain MDM   Final diagnoses:  Knee effusion, right  Fall from ladder, initial encounter      I personally performed the services described in this documentation, which was scribed in my presence. The recorded information has been reviewed and is accurate.     Earley FavorGail Aileen Amore, NP 09/14/14 2316  Lorre NickAnthony Allen, MD 09/14/14 [phone]

## 2014-09-14 NOTE — ED Notes (Signed)
Pt fell 6 feet off a ladder and injured right knee and tib/fib area today. Having severe medial right knee pain. No obvious deformity. Took 800 mg Ibuprofen around 1800 today. Walks with a limp. No other c/c.

## 2014-09-14 NOTE — Discharge Instructions (Signed)
°Knee Effusion °The medical term for having fluid in your knee is effusion. This is often due to an internal derangement of the knee. This means something is wrong inside the knee. Some of the causes of fluid in the knee may be torn cartilage, a torn ligament, or bleeding into the joint from an injury. Your knee is likely more difficult to bend and move. This is often because there is increased pain and pressure in the joint. The time it takes for recovery from a knee effusion depends on different factors, including:  °· Type of injury. °· Your age. °· Physical and medical conditions. °· Rehabilitation Strategies. °How long you will be away from your normal activities will depend on what kind of knee problem you have and how much damage is present. Your knee has two types of cartilage. Articular cartilage covers the bone ends and lets your knee bend and move smoothly. Two menisci, thick pads of cartilage that form a rim inside the joint, help absorb shock and stabilize your knee. Ligaments bind the bones together and support your knee joint. Muscles move the joint, help support your knee, and take stress off the joint itself. °CAUSES  °Often an effusion in the knee is caused by an injury to one of the menisci. This is often a tear in the cartilage. Recovery after a meniscus injury depends on how much meniscus is damaged and whether you have damaged other knee tissue. Small tears may heal on their own with conservative treatment. Conservative means rest, limited weight bearing activity and muscle strengthening exercises. Your recovery may take up to 6 weeks.  °TREATMENT  °Larger tears may require surgery. Meniscus injuries may be treated during arthroscopy. Arthroscopy is a procedure in which your surgeon uses a small telescope like instrument to look in your knee. Your caregiver can make a more accurate diagnosis (learning what is wrong) by performing an arthroscopic procedure. °If your injury is on the inner  margin of the meniscus, your surgeon may trim the meniscus back to a smooth rim. In other cases your surgeon will try to repair a damaged meniscus with stitches (sutures). This may make rehabilitation take longer, but may provide better long term result by helping your knee keep its shock absorption capabilities. °Ligaments which are completely torn usually require surgery for repair. °HOME CARE INSTRUCTIONS °· Use crutches as instructed. °· If a brace is applied, use as directed. °· Once you are home, an ice pack applied to your swollen knee may help with discomfort and help decrease swelling. °· Keep your knee raised (elevated) when you are not up and around or on crutches. °· Only take over-the-counter or prescription medicines for pain, discomfort, or fever as directed by your caregiver. °· Your caregivers will help with instructions for rehabilitation of your knee. This often includes strengthening exercises. °· You may resume a normal diet and activities as directed. °SEEK MEDICAL CARE IF:  °· There is increased swelling in your knee. °· You notice redness, swelling, or increasing pain in your knee. °· An unexplained oral temperature above 102° F (38.9° C) develops. °SEEK IMMEDIATE MEDICAL CARE IF:  °· You develop a rash. °· You have difficulty breathing. °· You have any allergic reactions from medications you may have been given. °· There is severe pain with any motion of the knee. °MAKE SURE YOU:  °· Understand these instructions. °· Will watch your condition. °· Will get help right away if you are not doing well or get worse. °  Document Released: 06/23/2003 Document Revised: 06/25/2011 Document Reviewed: 08/27/2007 [facility] Patient Information 2015 [place], [place]. This information is not intended to replace advice given to you by your health care provider. Make sure you discuss any questions you have with your health care provider. Her x-ray shows that you have a joint effusion but no fracture or  dislocation.  Even placed in a knee splint or immobilizer for comfort, crutches and pain medication with follow-up by orthopedic surgery

## 2014-09-17 ENCOUNTER — Other Ambulatory Visit: Payer: Self-pay | Admitting: Orthopedic Surgery

## 2014-09-17 DIAGNOSIS — M25561 Pain in right knee: Secondary | ICD-10-CM

## 2014-09-21 ENCOUNTER — Ambulatory Visit
Admission: RE | Admit: 2014-09-21 | Discharge: 2014-09-21 | Disposition: A | Discharge status: Home / Self Care | Payer: Medicaid Other | Source: Ambulatory Visit | Attending: Orthopedic Surgery | Admitting: Orthopedic Surgery

## 2014-09-21 ENCOUNTER — Other Ambulatory Visit: Payer: Medicaid Other

## 2014-09-21 DIAGNOSIS — M25561 Pain in right knee: Secondary | ICD-10-CM

## 2014-09-28 ENCOUNTER — Other Ambulatory Visit (HOSPITAL_COMMUNITY): Payer: Self-pay | Admitting: Orthopedic Surgery

## 2014-09-29 ENCOUNTER — Encounter (HOSPITAL_COMMUNITY): Payer: Self-pay | Admitting: *Deleted

## 2014-09-29 NOTE — Progress Notes (Signed)
Pt denies SOB, chest pain, and being under the care of a cardiologist. Pt denies having a chest x ray and EKG within the last year. Pt denies having a stress test, echo and cardiac cath. Pt made aware to stop  taking Aspirin, otc vitamins and herbal medications. Do not take any NSAIDs ie: Ibuprofen, Advil, Naproxen or any medication containing Aspirin. Pt verbalized understanding of all pre-op instructions. 

## 2014-09-29 NOTE — H&P (Signed)
Peter Leon is an 44 y.o. male.   Chief Complaint: Right knee pain HPI: Peter Leon is a 44 year old male with several month history of right knee pain following an injury. He was coming off a ladder and had a twisting injury with pop in the knee since that MRI scanning demonstrated medial meniscal tear and no fracture. He reports that continued pain on medial aspect of his knee along with mechanical symptoms and swelling. He is failed conservative course of management including injection and activity modification is very physical in his line of work. No family history of DVT or pulmonary embolism  Past Medical History  Diagnosis Date  . Acid reflux   . IBS (irritable bowel syndrome)   . Chronic back pain   . Right knee meniscal tear     medial  . Kidney stones     Past Surgical History  Procedure Laterality Date  . Leg surgery      compound fx   . Wisdom tooth extraction    . Lithotripsy      History reviewed. No pertinent family history. Social History:  reports that he has been smoking Cigarettes.  He has been smoking about 0.50 packs per day. He has never used smokeless tobacco. He reports that he uses illicit drugs (Marijuana). He reports that he does not drink alcohol.  Allergies:  Allergies  Allergen Reactions  . Morphine And Related Anaphylaxis and Rash  . Penicillins Anaphylaxis and Hives  . Vicodin [Hydrocodone-Acetaminophen] Hives  . Acetaminophen Rash    No prescriptions prior to admission    No results found for this or any previous visit (from the past 48 hour(s)). No results found.  Review of Systems  Constitutional: Negative.   HENT: Negative.   Eyes: Negative.   Respiratory: Negative.   Cardiovascular: Negative.   Gastrointestinal: Negative.   Genitourinary: Negative.   Musculoskeletal: Positive for joint pain.  Skin: Negative.   Neurological: Negative.   Endo/Heme/Allergies: Negative.   Psychiatric/Behavioral: Negative.     There were no  vitals taken for this visit. Physical Exam  Constitutional: He is oriented to person, place, and time. He appears well-developed.  HENT:  Head: Normocephalic.  Eyes: Pupils are equal, round, and reactive to light.  Neck: Normal range of motion.  Cardiovascular: Normal rate.   Respiratory: Effort normal.  Neurological: He is alert and oriented to person, place, and time.  Skin: Skin is warm.   examination of the right knee demonstrates medial joint line tenderness positive work compression testing stable collateral crucial ligament palpable pedal pulses good range of motion intact since mechanism positive McMurray compression testing no other masses lymphadenopathy or skin changes noted in the right knee region  Assessment/Plan Impression is right knee medial meniscal tear following trauma this tear is refractory to nonoperative management plan arthroscopy debridement risk and benefits discussed with the patient would not limited to knee stiffness incomplete pain relief infection potential need for more surgery all questions answered plan this as an outpatient procedure  Chanice Brenton SCOTT 09/29/2014, 10:37 PM

## 2014-09-30 ENCOUNTER — Ambulatory Visit (HOSPITAL_COMMUNITY): Payer: Medicaid Other | Admitting: Anesthesiology

## 2014-09-30 ENCOUNTER — Encounter (HOSPITAL_COMMUNITY): Payer: Self-pay | Admitting: *Deleted

## 2014-09-30 ENCOUNTER — Encounter (HOSPITAL_COMMUNITY)
Admission: RE | Disposition: A | Discharge status: Home / Self Care | Payer: Self-pay | Source: Ambulatory Visit | Attending: Orthopedic Surgery

## 2014-09-30 ENCOUNTER — Ambulatory Visit (HOSPITAL_COMMUNITY)
Admission: RE | Admit: 2014-09-30 | Discharge: 2014-09-30 | Disposition: A | Discharge status: Home / Self Care | Payer: Medicaid Other | Source: Ambulatory Visit | Attending: Orthopedic Surgery | Admitting: Orthopedic Surgery

## 2014-09-30 DIAGNOSIS — G8929 Other chronic pain: Secondary | ICD-10-CM | POA: Diagnosis not present

## 2014-09-30 DIAGNOSIS — Z886 Allergy status to analgesic agent status: Secondary | ICD-10-CM | POA: Diagnosis not present

## 2014-09-30 DIAGNOSIS — Z79899 Other long term (current) drug therapy: Secondary | ICD-10-CM | POA: Insufficient documentation

## 2014-09-30 DIAGNOSIS — K219 Gastro-esophageal reflux disease without esophagitis: Secondary | ICD-10-CM | POA: Diagnosis not present

## 2014-09-30 DIAGNOSIS — X58XXXA Exposure to other specified factors, initial encounter: Secondary | ICD-10-CM | POA: Insufficient documentation

## 2014-09-30 DIAGNOSIS — Z888 Allergy status to other drugs, medicaments and biological substances status: Secondary | ICD-10-CM | POA: Diagnosis not present

## 2014-09-30 DIAGNOSIS — K589 Irritable bowel syndrome without diarrhea: Secondary | ICD-10-CM | POA: Insufficient documentation

## 2014-09-30 DIAGNOSIS — F17210 Nicotine dependence, cigarettes, uncomplicated: Secondary | ICD-10-CM | POA: Insufficient documentation

## 2014-09-30 DIAGNOSIS — Z87442 Personal history of urinary calculi: Secondary | ICD-10-CM | POA: Diagnosis not present

## 2014-09-30 DIAGNOSIS — M25561 Pain in right knee: Secondary | ICD-10-CM | POA: Insufficient documentation

## 2014-09-30 DIAGNOSIS — Z880 Allergy status to penicillin: Secondary | ICD-10-CM | POA: Diagnosis not present

## 2014-09-30 DIAGNOSIS — M2241 Chondromalacia patellae, right knee: Secondary | ICD-10-CM | POA: Diagnosis not present

## 2014-09-30 DIAGNOSIS — S83241A Other tear of medial meniscus, current injury, right knee, initial encounter: Secondary | ICD-10-CM | POA: Diagnosis not present

## 2014-09-30 HISTORY — PX: KNEE ARTHROSCOPY WITH MEDIAL MENISECTOMY: SHX5651

## 2014-09-30 HISTORY — DX: Calculus of kidney: N20.0

## 2014-09-30 HISTORY — DX: Unspecified tear of unspecified meniscus, current injury, right knee, initial encounter: S83.206A

## 2014-09-30 LAB — COMPREHENSIVE METABOLIC PANEL
ALT: 410 U/L — ABNORMAL HIGH (ref 17–63)
AST: 295 U/L — AB (ref 15–41)
Albumin: 3.3 g/dL — ABNORMAL LOW (ref 3.5–5.0)
Alkaline Phosphatase: 95 U/L (ref 38–126)
Anion gap: 6 (ref 5–15)
BUN: 9 mg/dL (ref 6–20)
CALCIUM: 9 mg/dL (ref 8.9–10.3)
CO2: 27 mmol/L (ref 22–32)
Chloride: 104 mmol/L (ref 101–111)
Creatinine, Ser: 0.99 mg/dL (ref 0.61–1.24)
GFR calc non Af Amer: >60 mL/min (ref >60)
Glucose, Bld: 112 mg/dL — ABNORMAL HIGH (ref 65–99)
Potassium: 4.2 mmol/L (ref 3.5–5.1)
Sodium: 137 mmol/L (ref 135–145)
TOTAL PROTEIN: 6.5 g/dL (ref 6.5–8.1)
Total Bilirubin: 1.1 mg/dL (ref 0.3–1.2)

## 2014-09-30 LAB — CBC
HCT: 44.8 % (ref 39.0–52.0)
HEMOGLOBIN: 15.6 g/dL (ref 13.0–17.0)
MCH: 29.3 pg (ref 26.0–34.0)
MCHC: 34.8 g/dL (ref 30.0–36.0)
MCV: 84.1 fL (ref 78.0–100.0)
PLATELETS: 195 10*3/uL (ref 150–400)
RBC: 5.33 MIL/uL (ref 4.22–5.81)
RDW: 14.2 % (ref 11.5–15.5)
WBC: 6.6 10*3/uL (ref 4.0–10.5)

## 2014-09-30 SURGERY — ARTHROSCOPY, KNEE, WITH MEDIAL MENISCECTOMY
Anesthesia: General | Site: Knee | Laterality: Right

## 2014-09-30 MED ORDER — CLONIDINE HCL (ANALGESIA) 100 MCG/ML EP SOLN
EPIDURAL | Status: DC | PRN
  Administered 2014-09-30: 1 mL

## 2014-09-30 MED ORDER — FENTANYL CITRATE (PF) 100 MCG/2ML IJ SOLN: 25.0000 ug | INTRAMUSCULAR | Status: DC | PRN

## 2014-09-30 MED ORDER — BUPIVACAINE-EPINEPHRINE (PF) 0.5% -1:200000 IJ SOLN
INTRAMUSCULAR | Status: AC
  Filled 2014-09-30: qty 30

## 2014-09-30 MED ORDER — KETOROLAC TROMETHAMINE 10 MG PO TABS
10.0000 mg | ORAL_TABLET | Freq: Once | ORAL | Status: AC
  Administered 2014-09-30: 10 mg via ORAL
  Filled 2014-09-30 (×2): qty 1

## 2014-09-30 MED ORDER — LIDOCAINE HCL (CARDIAC) 20 MG/ML IV SOLN
INTRAVENOUS | Status: DC | PRN
  Administered 2014-09-30: 50 mg via INTRAVENOUS

## 2014-09-30 MED ORDER — FENTANYL CITRATE (PF) 250 MCG/5ML IJ SOLN
INTRAMUSCULAR | Status: AC
  Filled 2014-09-30: qty 5

## 2014-09-30 MED ORDER — CLINDAMYCIN PHOSPHATE 900 MG/50ML IV SOLN
INTRAVENOUS | Status: DC | PRN
  Administered 2014-09-30: 900 mg via INTRAVENOUS

## 2014-09-30 MED ORDER — DEXAMETHASONE SODIUM PHOSPHATE 4 MG/ML IJ SOLN
INTRAMUSCULAR | Status: DC | PRN
  Administered 2014-09-30: 8 mg via INTRAVENOUS

## 2014-09-30 MED ORDER — CLONIDINE HCL (ANALGESIA) 100 MCG/ML EP SOLN
150.0000 ug | Freq: Once | EPIDURAL | Status: DC
  Filled 2014-09-30: qty 1.5

## 2014-09-30 MED ORDER — SODIUM CHLORIDE 0.9 % IR SOLN
Status: DC | PRN
  Administered 2014-09-30: 3000 mL

## 2014-09-30 MED ORDER — BUPIVACAINE-EPINEPHRINE 0.5% -1:200000 IJ SOLN
INTRAMUSCULAR | Status: DC | PRN
  Administered 2014-09-30: 10 mL

## 2014-09-30 MED ORDER — CLINDAMYCIN PHOSPHATE 900 MG/50ML IV SOLN
900.0000 mg | Freq: Once | INTRAVENOUS | Status: DC
  Filled 2014-09-30: qty 50

## 2014-09-30 MED ORDER — OXYCODONE HCL 15 MG PO TABS: 15.0000 mg | ORAL_TABLET | Freq: Four times a day (QID) | ORAL | Status: AC | PRN

## 2014-09-30 MED ORDER — OXYCODONE HCL 5 MG PO TABS
15.0000 mg | ORAL_TABLET | Freq: Once | ORAL | Status: AC
  Administered 2014-09-30: 15 mg via ORAL

## 2014-09-30 MED ORDER — OXYCODONE HCL 5 MG PO TABS
ORAL_TABLET | ORAL | Status: AC
  Administered 2014-09-30: 15 mg via ORAL
  Filled 2014-09-30: qty 3

## 2014-09-30 MED ORDER — ONDANSETRON HCL 4 MG/2ML IJ SOLN
INTRAMUSCULAR | Status: DC | PRN
  Administered 2014-09-30: 4 mg via INTRAVENOUS

## 2014-09-30 MED ORDER — PROMETHAZINE HCL 25 MG/ML IJ SOLN: 6.2500 mg | INTRAMUSCULAR | Status: DC | PRN

## 2014-09-30 MED ORDER — FENTANYL CITRATE (PF) 100 MCG/2ML IJ SOLN
INTRAMUSCULAR | Status: DC | PRN
  Administered 2014-09-30: 50 ug via INTRAVENOUS

## 2014-09-30 MED ORDER — PROPOFOL 10 MG/ML IV BOLUS
INTRAVENOUS | Status: DC | PRN
  Administered 2014-09-30: 180 mg via INTRAVENOUS

## 2014-09-30 MED ORDER — MIDAZOLAM HCL 2 MG/2ML IJ SOLN
INTRAMUSCULAR | Status: AC
  Filled 2014-09-30: qty 2

## 2014-09-30 MED ORDER — LACTATED RINGERS IV SOLN
INTRAVENOUS | Status: DC
  Administered 2014-09-30 (×2): via INTRAVENOUS

## 2014-09-30 MED ORDER — PROPOFOL 10 MG/ML IV BOLUS
INTRAVENOUS | Status: AC
  Filled 2014-09-30: qty 20

## 2014-09-30 SURGICAL SUPPLY — 48 items
BANDAGE ELASTIC 6 VELCRO ST LF (GAUZE/BANDAGES/DRESSINGS) IMPLANT
BANDAGE ESMARK 6X9 LF (GAUZE/BANDAGES/DRESSINGS) IMPLANT
BLADE CUDA 5.5 (BLADE) IMPLANT
BLADE GREAT WHITE 4.2 (BLADE) IMPLANT
BLADE SURG ROTATE 9660 (MISCELLANEOUS) IMPLANT
BNDG ELASTIC 6X10 VLCR STRL LF (GAUZE/BANDAGES/DRESSINGS) ×2 IMPLANT
BNDG ESMARK 6X9 LF (GAUZE/BANDAGES/DRESSINGS)
BUR OVAL 6.0 (BURR) IMPLANT
COVER SURGICAL LIGHT HANDLE (MISCELLANEOUS) ×2 IMPLANT
CUFF TOURNIQUET SINGLE 34IN LL (TOURNIQUET CUFF) ×2 IMPLANT
CUFF TOURNIQUET SINGLE 44IN (TOURNIQUET CUFF) IMPLANT
DRAPE ARTHROSCOPY W/POUCH 114 (DRAPES) ×2 IMPLANT
DRAPE INCISE IOBAN 66X45 STRL (DRAPES) IMPLANT
DRAPE PROXIMA HALF (DRAPES) IMPLANT
DRAPE U-SHAPE 47X51 STRL (DRAPES) ×2 IMPLANT
DRSG PAD ABDOMINAL 8X10 ST (GAUZE/BANDAGES/DRESSINGS) ×2 IMPLANT
DURAPREP 26ML APPLICATOR (WOUND CARE) ×2 IMPLANT
GAUZE SPONGE 4X4 12PLY STRL (GAUZE/BANDAGES/DRESSINGS) ×2 IMPLANT
GAUZE XEROFORM 1X8 LF (GAUZE/BANDAGES/DRESSINGS) ×2 IMPLANT
GLOVE BIOGEL PI IND STRL 8 (GLOVE) ×1 IMPLANT
GLOVE BIOGEL PI INDICATOR 8 (GLOVE) ×1
GLOVE SURG ORTHO 8.0 STRL STRW (GLOVE) ×2 IMPLANT
GOWN STRL REUS W/ TWL LRG LVL3 (GOWN DISPOSABLE) ×2 IMPLANT
GOWN STRL REUS W/ TWL XL LVL3 (GOWN DISPOSABLE) ×1 IMPLANT
GOWN STRL REUS W/TWL LRG LVL3 (GOWN DISPOSABLE) ×2
GOWN STRL REUS W/TWL XL LVL3 (GOWN DISPOSABLE) ×1
KIT BASIN OR (CUSTOM PROCEDURE TRAY) ×2 IMPLANT
KIT ROOM TURNOVER OR (KITS) ×2 IMPLANT
MANIFOLD NEPTUNE II (INSTRUMENTS) IMPLANT
NEEDLE 18GX1X1/2 (RX/OR ONLY) (NEEDLE) IMPLANT
NEEDLE HYPO 25GX1X1/2 BEV (NEEDLE) ×2 IMPLANT
NS IRRIG 1000ML POUR BTL (IV SOLUTION) IMPLANT
PACK ARTHROSCOPY DSU (CUSTOM PROCEDURE TRAY) ×2 IMPLANT
PAD ARMBOARD 7.5X6 YLW CONV (MISCELLANEOUS) ×4 IMPLANT
PADDING CAST COTTON 6X4 STRL (CAST SUPPLIES) ×2 IMPLANT
SET ARTHROSCOPY TUBING (MISCELLANEOUS) ×1
SET ARTHROSCOPY TUBING LN (MISCELLANEOUS) ×1 IMPLANT
SPONGE LAP 4X18 X RAY DECT (DISPOSABLE) ×2 IMPLANT
SUT ETHILON 3 0 PS 1 (SUTURE) ×2 IMPLANT
SUT MENISCAL KIT (KITS) IMPLANT
SYR 20ML ECCENTRIC (SYRINGE) ×2 IMPLANT
SYR CONTROL 10ML LL (SYRINGE) ×2 IMPLANT
SYR TB 1ML LUER SLIP (SYRINGE) IMPLANT
TOWEL OR 17X24 6PK STRL BLUE (TOWEL DISPOSABLE) ×2 IMPLANT
TOWEL OR 17X26 10 PK STRL BLUE (TOWEL DISPOSABLE) ×2 IMPLANT
TUBE CONNECTING 12X1/4 (SUCTIONS) ×2 IMPLANT
WAND HAND CNTRL MULTIVAC 90 (MISCELLANEOUS) ×2 IMPLANT
WATER STERILE IRR 1000ML POUR (IV SOLUTION) ×2 IMPLANT

## 2014-09-30 NOTE — Anesthesia Procedure Notes (Signed)
Procedure Name: LMA Insertion Date/Time: 09/30/2014 1:03 PM Performed by: Marylyn Ishihara Pre-anesthesia Checklist: Patient identified, Timeout performed, Emergency Drugs available, Suction available and Patient being monitored Patient Re-evaluated:Patient Re-evaluated prior to inductionOxygen Delivery Method: Circle system utilized Preoxygenation: Pre-oxygenation with 100% oxygen Intubation Type: IV induction Ventilation: Mask ventilation without difficulty LMA: LMA inserted LMA Size: 4.0 Laryngoscope Size: Mac and 3 Grade View: Grade I Tube type: Oral Number of attempts: 1 Placement Confirmation: positive ETCO2 and breath sounds checked- equal and bilateral Tube secured with: Tape Dental Injury: Teeth and Oropharynx as per pre-operative assessment

## 2014-09-30 NOTE — Anesthesia Postprocedure Evaluation (Signed)
  Anesthesia Post-op Note  Patient: Peter Leon  Procedure(s) Performed: Procedure(s): KNEE DIAGNOSTIC OPERATIVE ARTHROSCOPY WITH PARTIAL MEDIAL MENISECTOMY AND DEBRIDEMENT. (Right)  Patient Location: PACU  Anesthesia Type:General  Level of Consciousness: awake  Airway and Oxygen Therapy: Patient Spontanous Breathing  Post-op Pain: mild  Post-op Assessment: Post-op Vital signs reviewed     RLE Motor Response: Responds to commands, Purposeful movement RLE Sensation: Full sensation      Post-op Vital Signs: Reviewed  Last Vitals:  Filed Vitals:   09/30/14 1500  BP: 129/79  Pulse: 78  Temp: 36.7 C  Resp: 12    Complications: No apparent anesthesia complications

## 2014-09-30 NOTE — Anesthesia Preprocedure Evaluation (Signed)
Anesthesia Evaluation  Patient identified by MRN, date of birth, ID band  Reviewed: Allergy & Precautions, NPO status , Patient's Chart, lab work & pertinent test results  Airway Mallampati: II  TM Distance: >3 FB Neck ROM: Full    Dental   Pulmonary Current Smoker,  breath sounds clear to auscultation        Cardiovascular negative cardio ROS  Rhythm:Regular Rate:Normal     Neuro/Psych    GI/Hepatic Neg liver ROS, GERD-  ,  Endo/Other    Renal/GU Renal disease     Musculoskeletal   Abdominal   Peds  Hematology   Anesthesia Other Findings   Reproductive/Obstetrics                             Anesthesia Physical Anesthesia Plan  ASA: II  Anesthesia Plan:    Post-op Pain Management:    Induction: Intravenous  Airway Management Planned: LMA  Additional Equipment:   Intra-op Plan:   Post-operative Plan: Extubation in OR  Informed Consent: I have reviewed the patients History and Physical, chart, labs and discussed the procedure including the risks, benefits and alternatives for the proposed anesthesia with the patient or authorized representative who has indicated his/her understanding and acceptance.   Dental advisory given  Plan Discussed with: CRNA and Anesthesiologist  Anesthesia Plan Comments:         Anesthesia Quick Evaluation

## 2014-09-30 NOTE — Transfer of Care (Signed)
Immediate Anesthesia Transfer of Care Note  Patient: Peter Leon  Procedure(s) Performed: Procedure(s): KNEE DIAGNOSTIC OPERATIVE ARTHROSCOPY WITH PARTIAL MEDIAL MENISECTOMY AND DEBRIDEMENT. (Right)  Patient Location: PACU  Anesthesia Type:General  Level of Consciousness: awake, alert , oriented, patient cooperative, lethargic and responds to stimulation  Airway & Oxygen Therapy: Patient Spontanous Breathing and Patient connected to nasal cannula oxygen  Post-op Assessment: Report given to RN and Patient moving all extremities X 4  Post vital signs: stable  Last Vitals:  Filed Vitals:   09/30/14 1045  BP:   Pulse:   Temp: 36.7 C  Resp:     Complications: No apparent anesthesia complications

## 2014-09-30 NOTE — Brief Op Note (Signed)
09/30/2014  1:46 PM  PATIENT:  Madison Hickman  44 y.o. male  PRE-OPERATIVE DIAGNOSIS:  RIGHT KNEE MEDIAL MENISCAL TEAR  POST-OPERATIVE DIAGNOSIS:  RIGHT KNEE MEDIAL MENISCAL TEAR  PROCEDURE:  Procedure(s): KNEE DIAGNOSTIC OPERATIVE ARTHROSCOPY WITH PARTIAL MEDIAL MENISECTOMY AND DEBRIDEMENT.  SURGEON:  Surgeon(s): Cammy Copa, MD  ASSISTANT: carla bethune rnfa  ANESTHESIA:   general  EBL: 1 ml    Total I/O In: 2500 [I.V.:2500] Out: 25 [Blood:25]  BLOOD ADMINISTERED: none  DRAINS: none   LOCAL MEDICATIONS USED:  Marcaine clonidine  SPECIMEN:  No Specimen  COUNTS:  YES  TOURNIQUET:    DICTATION: .Other Dictation: Dictation Number done  PLAN OF CARE: Discharge to home after PACU  PATIENT DISPOSITION:  PACU - hemodynamically stable

## 2014-09-30 NOTE — Interval H&P Note (Signed)
History and Physical Interval Note:  09/30/2014 7:25 AM  Peter Leon  has presented today for surgery, with the diagnosis of RIGHT KNEE MEDIAL MENISCAL TEAR  The various methods of treatment have been discussed with the patient and family. After consideration of risks, benefits and other options for treatment, the patient has consented to  Procedure(s): KNEE DIAGNOSTIC OPERATIVE ARTHROSCOPY WITH PARTIAL MEDIAL MENISECTOMY AND DEBRIDEMENT. (Right) as a surgical intervention .  The patient's history has been reviewed, patient examined, no change in status, stable for surgery.  I have reviewed the patient's chart and labs.  Questions were answered to the patient's satisfaction.     DEAN,GREGORY SCOTT

## 2014-10-01 ENCOUNTER — Encounter (HOSPITAL_COMMUNITY): Payer: Self-pay | Admitting: Orthopedic Surgery

## 2014-10-01 NOTE — Op Note (Signed)
NAME:  [name], [name] NO.:  [id number]  MEDICAL RECORD NO.:  [id number]  LOCATION:  [facility]                         FACILITY:  [facility]  PHYSICIAN:  Burnard Bunting, M.D.    DATE OF BIRTH:  Jan 31, 1971  DATE OF PROCEDURE: DATE OF DISCHARGE:                              OPERATIVE REPORT   PREOPERATIVE DIAGNOSIS:  Right knee medial meniscal tear.  POSTOPERATIVE DIAGNOSES:  Right knee medial meniscal tear, chondromalacia of medial femoral condyle, and landing zone of the patella.  SURGEON:  Burnard Bunting, M.D.  ASSISTANT:  Patrick Jupiter, RNFA.  INDICATIONS:  Peter Leon is a patient, who injured his right knee. He presents for operative management after explanation of risks and benefits of medial meniscal tear.  OPERATIVE FINDINGS: 1. Examination under anesthesia, range of motion 0-145 with stability,     varus, valgus stress.  ACL, PCL intact.  No posterolateral rotatory     instability is noted. 2. Diagnostic and operative arthroscopy. 3. No loose bodies medial lateral gutter. 4. Mild grade 1 chondromalacia on the medial and lateral facet of the     patella, but there was a grade 3 chondromalacia with no loose     chondral flaps landing zone in the trochlea, proximal aspect of the     of the trochlea. 5. Intact ACL and PCL. 6. Intact lateral compartment, articular cartilage, and meniscus. 7. Grade 2 chondromalacia 50% weightbearing surface area of the medial     femoral condyle with a tear of the posterolateral medial meniscus     involving about 50% of the anterior-posterior within the meniscus     over 1.5 cm area.  PROCEDURE IN DETAIL:  The patient was brought to operating room, where general endotracheal anesthesia was induced.  Preoperative antibiotics were administered.  Time-out was called.  Right leg was prescrubbed with alcohol and Betadine, allowed to air dry, prepped with DuraPrep solution and draped in a sterile manner.  Examination under  anesthesia was performed.  Time-out was called.  Anterior, inferior, and lateral portals were established.  Anterior, inferior, and medial portals established under direct visualization.  Diagnostic arthroscopy demonstrated no loose bodies in the medial or lateral gutter. Undersurface of the patella looked reasonably intact, however, there was significant wear on the trochlea in the landing zone over a size of about a quarter.  No loose chondral flaps were present, however, it was not down the subchondral bone, but was grade 3.  Lateral compartment inspected and found to have intact meniscus and articular cartilage. Medial compartment inspected, found to have a medial meniscal tear and chondromalacia on both the femoral and tibial surfaces.  Meniscal tear was then debrided back to stable rims, combination of basket punch and shaver.  Following debridement, thorough irrigation was performed.  Instruments were removed.  Portals were closed using 3-0 nylon.  Solution of Marcaine, clonidine injected into the knee.  Bulky wrap applied.  The patient tolerated the procedure well without immediate complication.     Burnard Bunting, M.D.     GSD/MEDQ  D:  09/30/2014  T:  10/01/2014  Job:  [id number]

## 2014-12-28 ENCOUNTER — Other Ambulatory Visit: Payer: Self-pay | Admitting: Orthopedic Surgery

## 2014-12-28 DIAGNOSIS — M25562 Pain in left knee: Secondary | ICD-10-CM

## 2015-01-06 ENCOUNTER — Ambulatory Visit
Admission: RE | Admit: 2015-01-06 | Discharge: 2015-01-06 | Disposition: A | Discharge status: Home / Self Care | Payer: Medicaid Other | Source: Ambulatory Visit | Attending: Orthopedic Surgery | Admitting: Orthopedic Surgery

## 2015-01-06 DIAGNOSIS — M25562 Pain in left knee: Secondary | ICD-10-CM

## 2015-07-09 DIAGNOSIS — R188 Other ascites: Secondary | ICD-10-CM

## 2015-07-09 NOTE — ED Provider Notes (Signed)
[facility]  eMERGENCY dEPARTMENT eNCOUnter      Pt Name: Travis Ortega Ledesma  MRN: [id number]  Birthdate 04/25/1970  Date of evaluation: 07/09/2015  Provider: Dossie Derandhir Mckinze Poirier, MD    CHIEF COMPLAINT       Chief Complaint   Patient presents with   ??? Cirrhosis     Abdominal swelling and pain         HISTORY OF PRESENT ILLNESS   (Location/Symptom, Timing/Onset, Context/Setting, Quality, Duration, Modifying Factors, Severity)  Note limiting factors.   Travis Ortega Christenbury is a 45 y.o. male who presents to the emergency department  as per patient he has a history of alcoholic cirrhosis and also hepatitis C, [place], ran out of all medications including swelling of the belly X abdominal pain fever no chills here for further evaluation,patient denies any alcohol past few months    HPI    Nursing Notes were reviewed.    REVIEW OF SYSTEMS    (2-9 systems for level 4, 10 or more for level 5)     Review of Systems   Constitutional: Negative.  Negative for activity change and fever.   HENT: Negative for congestion, drooling, facial swelling, mouth sores, nosebleeds, sinus pressure, sore throat, trouble swallowing and voice change.    Eyes: Negative for pain, discharge, redness and visual disturbance.   Respiratory: Negative for cough, choking, chest tightness, shortness of breath, wheezing and stridor.    Cardiovascular: Positive for leg swelling. Negative for chest pain and palpitations.   Gastrointestinal: Positive for abdominal distention and abdominal pain. Negative for blood in stool, constipation, diarrhea and vomiting.   Endocrine: Negative for cold intolerance, polyphagia and polyuria.   Genitourinary: Negative for dysuria, flank pain, frequency, genital sores and urgency.   Musculoskeletal: Negative for back pain, joint swelling, neck pain and neck stiffness.   Skin: Negative for pallor and rash.   Neurological: Negative for tremors, seizures, syncope, weakness, numbness and headaches.   Hematological:  Negative for adenopathy. Does not bruise/bleed easily.   Psychiatric/Behavioral: Negative for agitation, behavioral problems, hallucinations and sleep disturbance. The patient is not hyperactive.    All other systems reviewed and are negative.      Except as noted above the remainder of the review of systems was reviewed and negative.       PAST MEDICAL HISTORY     Past Medical History   Diagnosis Date   ??? Cirrhosis (HCC)    ??? Hepatitis B    ??? Hepatitis C          SURGICAL HISTORY       Past Surgical History   Procedure Laterality Date   ??? Hernia repair     ??? Knee surgery Right      arthroscopy          CURRENT MEDICATIONS       Previous Medications    FUROSEMIDE (LASIX) 20 MG TABLET    Take 20 mg by mouth 3 times daily       ALLERGIES     Morphine; Pcn [penicillins]; and Tylenol [acetaminophen]    FAMILY HISTORY     History reviewed. No pertinent family history.       SOCIAL HISTORY       Social History     Social History   ??? Marital status: Single     Spouse name: N/A   ??? Number of children: N/A   ??? Years of education: N/A     Social  History Main Topics   ??? Smoking status: Current Every Day Smoker     Packs/day: 0.50     Types: Cigarettes   ??? Smokeless tobacco: None   ??? Alcohol use No   ??? Drug use: No   ??? Sexual activity: Not Asked     Other Topics Concern   ??? None     Social History Narrative   ??? None       SCREENINGS             PHYSICAL EXAM    (up to 7 for level 4, 8 or more for level 5)   ED Triage Vitals   BP Temp Temp Source Pulse Resp SpO2 Height Weight   07/09/15 2154 07/09/15 2154 07/09/15 2154 07/09/15 2154 07/09/15 2154 07/09/15 2154 07/09/15 2154 07/09/15 2154   159/92 98.2 ??F (36.8 ??C) Oral 80 16 97 %  (1.854 m) 235 lb (106.6 kg)       Physical Exam   Constitutional: He is oriented to person, place, and time. He appears well-developed and well-nourished.    no jaundice noticed slightly uncomfortable due to abdominal distention   HENT:   Head: Normocephalic and atraumatic.   Eyes: Conjunctivae  and EOM are normal. Left eye exhibits no discharge. No scleral icterus.   Neck: Neck supple. No JVD present. No tracheal deviation present. No thyromegaly present.   Cardiovascular: Normal rate and normal heart sounds.  Exam reveals no gallop.    No murmur heard.  Pulmonary/Chest: Breath sounds normal. No respiratory distress. He has no wheezes. He has no rales. He exhibits no tenderness.   Abdominal: Soft. Bowel sounds are normal. He exhibits distension. He exhibits no mass. There is tenderness. There is no rebound and no guarding.   Musculoskeletal: Normal range of motion. He exhibits no edema or tenderness.   Lymphadenopathy:     He has no cervical adenopathy.   Neurological: He is alert and oriented to person, place, and time. No cranial nerve deficit. He exhibits normal muscle tone.   Skin: Skin is warm. No rash noted. No erythema.   Psychiatric: His behavior is normal. Thought content normal.       DIAGNOSTIC RESULTS     EKG: All EKG's are interpreted by the Emergency Department Physician who either signs or Co-signs this chart in the absence of a cardiologist.        RADIOLOGY:   Non-plain film images such as CT, Ultrasound and MRI are read by the radiologist. Plain radiographic images are visualized and preliminarily interpreted by the emergency physician with the below findings:      Interpretation per the Radiologist below, if available at the time of this note:    No orders to display         ED BEDSIDE ULTRASOUND:   Performed by ED Physician - none    LABS:  Labs Reviewed   COMPREHENSIVE METABOLIC PANEL - Abnormal; Notable for the following:        Result Value    CREATININE 0.67 (*)     Alb 3.1 (*)     Alkaline Phosphatase 240 (*)     ALT 145 (*)     AST 338 (*)     Globulin 4.8 (*)     All other components within normal limits   CBC WITH AUTO DIFFERENTIAL - Abnormal; Notable for the following:     WBC 4.5 (*)     Hemoglobin 13.8 (*)     Hematocrit  40.7 (*)     RDW 15.7 (*)     All other components  within normal limits   MICROSCOPIC URINALYSIS - Abnormal; Notable for the following:     RBC, UA 3-5 (*)     All other components within normal limits   URINE CULTURE   LIPASE   AMYLASE   URINE RT REFLEX TO CULTURE   URINE DRUG SCREEN, COMPREHENSIVE       All other labs were within normal range or not returned as of this dictation.    EMERGENCY DEPARTMENT COURSE and DIFFERENTIAL DIAGNOSIS/MDM:   Vitals:    Vitals:    07/09/15 2154 07/09/15 2315 07/09/15 2337   BP: (!) 159/92 (!) 165/99 (!) 146/89   Pulse: 80 83 86   Resp: Temp: 98.2 ??F (36.8 ??C)     TempSrc: Oral     SpO2: 97% 98% 97%   Weight: 235 lb (106.6 kg)     Height:  (1.854 m)           MDM    CRITICAL CARE TIME   Total Critical Care time was  minutes, excluding separately reportable procedures.  There was a high probability of clinically significant/life threatening deterioration in the patient's condition which required my urgent intervention.      CONSULTS:  None    PROCEDURES:  Unless otherwise noted below, none     Procedures    FINAL IMPRESSION      1. Other ascites          DISPOSITION/PLAN   DISPOSITION Decision to Discharge    PATIENT REFERRED TO:  Georjean Mode, MD  [address]  [place] [address]  [phone]    In 1 week        DISCHARGE MEDICATIONS:  New Prescriptions    OXYCODONE HCL (OXY-IR) 10 MG IMMEDIATE RELEASE TABLET    Take 1 tablet by mouth every 6 hours as needed for Pain .    SPIRONOLACTONE (ALDACTONE) 25 MG TABLET    Take 1 tablet by mouth daily          (Please note that portions of this note were completed with a voice recognition program.  Efforts were made to edit the dictations but occasionally words are mis-transcribed.)    Dossie Der, MD (electronically signed)  Attending Emergency Physician       Dossie Der, MD  07/10/15 [phone]

## 2015-07-09 NOTE — ED Notes (Signed)
Patient refuses to provide urine sample     Delton Coombesobert Kenni Newton, RN  07/09/15 2348

## 2015-07-09 NOTE — ED Notes (Signed)
Patient states that he does not like the way the Nubain made him feel. States that he is flushed and jittery and thinks he may has an allergy to it and does not want it anymore.  Zofran given for nausea and ativan given for anxiety. Fam at bedside. Will cont. To monitor     Delton CoombesRobert Renee Erb, RN  07/09/15 [phone]

## 2015-07-09 NOTE — ED Triage Notes (Signed)
Patient came into ER with complaints of abdominal swelling and pain that has increased over the past 3 days patient recently moved to [place] in January. In [place] since he moved [place] he has been unable to fill his Rx for the hepatitis.

## 2015-07-10 ENCOUNTER — Inpatient Hospital Stay
Admit: 2015-07-10 | Discharge: 2015-07-10 | Disposition: A | Discharge status: Home / Self Care | Payer: MEDICAID | Attending: Emergency Medicine

## 2015-07-10 LAB — URINE DRUG SCREEN, COMPREHENSIVE
Amphetamine Screen, Urine: NEGATIVE (ref <1000)
Barbiturate Screen, Ur: NEGATIVE (ref <300)
Benzodiazepine Screen, Urine: NEGATIVE (ref <300)
Cannabinoid Scrn, Ur: NEGATIVE (ref <50)
Cocaine Metabolite Screen, Urine: NEGATIVE (ref <300)
Opiate Scrn, Ur: NEGATIVE (ref <300)
PCP Screen, Urine: NEGATIVE
Tricyclic: NEGATIVE (ref <1000)

## 2015-07-10 LAB — URINALYSIS WITH REFLEX TO CULTURE
Bilirubin Urine: NEGATIVE
Glucose, Ur: NEGATIVE mg/dL
Ketones, Urine: NEGATIVE mg/dL
Leukocyte Esterase, Urine: NEGATIVE
Nitrite, Urine: NEGATIVE
Protein, UA: NEGATIVE mg/dL
Specific Gravity, UA: 1.01 (ref 1.005–1.030)
Urobilinogen, Urine: 0.2 E.U./dL (ref <2.0)
pH, UA: 6.5 (ref 5.0–9.0)

## 2015-07-10 LAB — CBC WITH AUTO DIFFERENTIAL
Basophils %: 0.6 %
Basophils Absolute: 0 10*3/uL (ref 0.0–0.2)
Eosinophils %: 3 %
Eosinophils Absolute: 0.1 10*3/uL (ref 0.0–0.7)
Hematocrit: 40.7 % — ABNORMAL LOW (ref 42.0–52.0)
Hemoglobin: 13.8 g/dL — ABNORMAL LOW (ref 14.0–18.0)
Lymphocytes %: 22.9 %
Lymphocytes Absolute: 1 10*3/uL (ref 1.0–4.8)
MCH: 27.9 pg (ref 27.0–31.3)
MCHC: 33.9 % (ref 33.0–37.0)
MCV: 82.3 fL (ref 80.0–100.0)
Monocytes %: 10.1 %
Monocytes Absolute: 0.5 10*3/uL (ref 0.2–0.8)
Neutrophils %: 63.4 %
Neutrophils Absolute: 2.9 10*3/uL (ref 1.4–6.5)
Platelets: 168 10*3/uL (ref 130–400)
RBC: 4.94 M/uL (ref 4.70–6.10)
RDW: 15.7 % — ABNORMAL HIGH (ref 11.5–14.5)
WBC: 4.5 10*3/uL — ABNORMAL LOW (ref 4.8–10.8)

## 2015-07-10 LAB — COMPREHENSIVE METABOLIC PANEL
ALT: 145 U/L — ABNORMAL HIGH (ref 0–41)
AST: 338 U/L — ABNORMAL HIGH (ref 0–40)
Albumin: 3.1 g/dL — ABNORMAL LOW (ref 3.9–4.9)
Alkaline Phosphatase: 240 U/L — ABNORMAL HIGH (ref 35–104)
Anion Gap: 10 mEq/L (ref 7–13)
BUN: 10 mg/dL (ref 6–20)
CO2: 28 mEq/L (ref 22–29)
Calcium: 8.7 mg/dL (ref 8.6–10.2)
Chloride: 98 mEq/L (ref 98–107)
Creatinine: 0.67 mg/dL — ABNORMAL LOW (ref 0.70–1.20)
GFR African American: >60 (ref >60)
GFR Non-African American: >60 (ref >60)
Globulin: 4.8 g/dL — ABNORMAL HIGH (ref 2.3–3.5)
Glucose: 97 mg/dL (ref 74–109)
Potassium: 3.7 mEq/L (ref 3.5–5.1)
Sodium: 136 mEq/L (ref 132–144)
Total Bilirubin: 1.2 mg/dL (ref 0.0–1.2)
Total Protein: 7.9 g/dL (ref 6.4–8.1)

## 2015-07-10 LAB — MICROSCOPIC URINALYSIS

## 2015-07-10 LAB — LIPASE: Lipase: 21 U/L (ref 13–60)

## 2015-07-10 LAB — AMYLASE: Amylase: 32 U/L (ref 28–100)

## 2015-07-10 MED ORDER — SPIRONOLACTONE 25 MG PO TABS
25 MG | ORAL_TABLET | Freq: Every day | ORAL | 3 refills | Status: DC
Start: 2015-07-10 — End: 2016-07-02

## 2015-07-10 MED ORDER — FUROSEMIDE 10 MG/ML IJ SOLN
10 MG/ML | Freq: Once | INTRAMUSCULAR | Status: AC
Start: 2015-07-10 — End: 2015-07-09
  Administered 2015-07-10: 02:00:00 20 mg via INTRAVENOUS

## 2015-07-10 MED ORDER — ONDANSETRON HCL 4 MG/2ML IJ SOLN
4 MG/2ML | INTRAMUSCULAR | Status: AC
Start: 2015-07-10 — End: 2015-07-09
  Administered 2015-07-10: 03:00:00 4

## 2015-07-10 MED ORDER — ONDANSETRON HCL 4 MG/2ML IJ SOLN
4 MG/2ML | Freq: Once | INTRAMUSCULAR | Status: DC
Start: 2015-07-10 — End: 2015-07-10

## 2015-07-10 MED ORDER — OXYCODONE HCL 10 MG PO TABS
10 MG | ORAL_TABLET | Freq: Four times a day (QID) | ORAL | 0 refills | Status: DC | PRN
Start: 2015-07-10 — End: 2016-07-02

## 2015-07-10 MED ORDER — TRAMADOL HCL 50 MG PO TABS
50 MG | ORAL_TABLET | Freq: Three times a day (TID) | ORAL | 0 refills | Status: DC | PRN
Start: 2015-07-10 — End: 2015-07-09

## 2015-07-10 MED ORDER — LORAZEPAM 2 MG/ML IJ SOLN
2 MG/ML | Freq: Once | INTRAMUSCULAR | Status: AC
Start: 2015-07-10 — End: 2015-07-09
  Administered 2015-07-10: 03:00:00 1 mg via INTRAVENOUS

## 2015-07-10 MED ORDER — NALBUPHINE HCL 20 MG/ML IJ SOLN
20 MG/ML | Freq: Once | INTRAMUSCULAR | Status: AC
Start: 2015-07-10 — End: 2015-07-09
  Administered 2015-07-10: 02:00:00 20 mg via INTRAVENOUS

## 2015-07-10 MED FILL — NALBUPHINE HCL 20 MG/ML IJ SOLN: 20 MG/ML | INTRAMUSCULAR | Qty: 1

## 2015-07-10 MED FILL — ONDANSETRON HCL 4 MG/2ML IJ SOLN: 4 MG/2ML | INTRAMUSCULAR | Qty: 2

## 2015-07-10 MED FILL — FUROSEMIDE 10 MG/ML IJ SOLN: 10 MG/ML | INTRAMUSCULAR | Qty: 2

## 2015-07-10 MED FILL — LORAZEPAM 2 MG/ML IJ SOLN: 2 MG/ML | INTRAMUSCULAR | Qty: 1

## 2015-07-11 LAB — CULTURE, URINE: Urine Culture, Routine: NO GROWTH

## 2016-03-30 IMAGING — MR MR KNEE*R* W/O CM
5 of 6 series · 32 of 40 positions shown · non-contrast
Comparison: 09/21/2014

CLINICAL DATA: Right knee pain status post fall from a ladder.

EXAM:
MRI OF THE RIGHT KNEE WITHOUT CONTRAST
TECHNIQUE: Multiplanar, multisequence MR imaging of the knee was performed. No
intravenous contrast was administered.

[Series 7: PD fat-sat · axial · right · 3.0mm · 0.36mm/px · z∈[-22,+74]mm · 6 of 30 slices shown (1 of 3)]
[im 1/30]
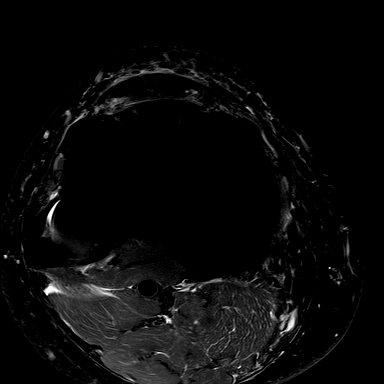
[im 6/30]
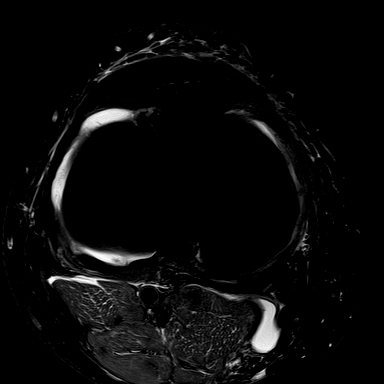
[im 12/30]
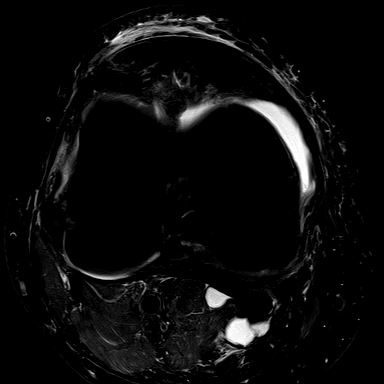
[im 18/30]
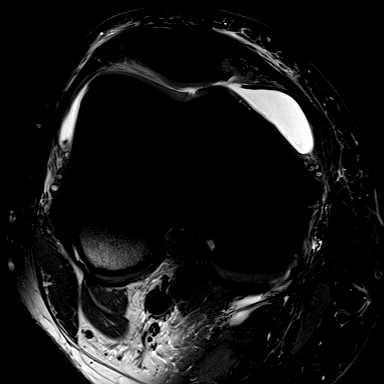
[im 24/30]
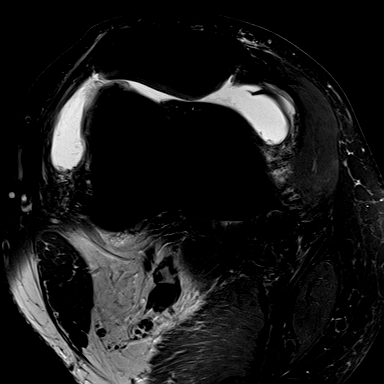
[im 30/30]
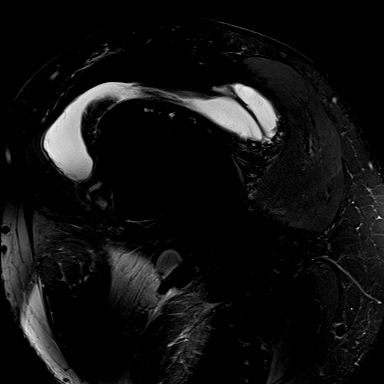

[Series 8: PD fat-sat · sagittal · right · 3.0mm · 0.39mm/px · 6 of 30 slices shown (2 of 3)]
[im 1/30]
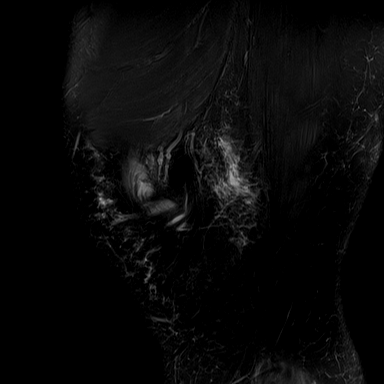
[im 6/30]
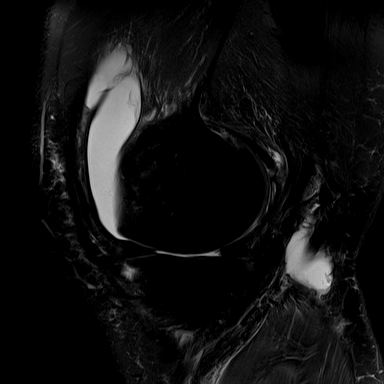
[im 12/30]
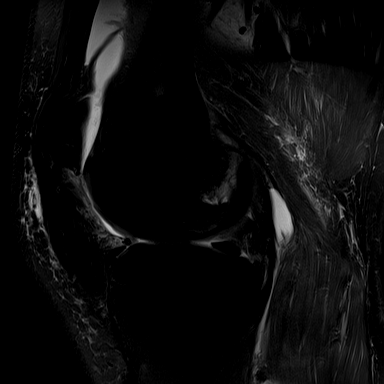
[im 18/30]
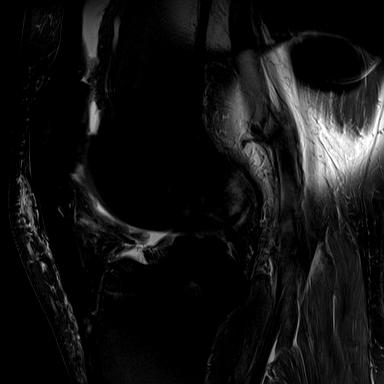
[im 24/30]
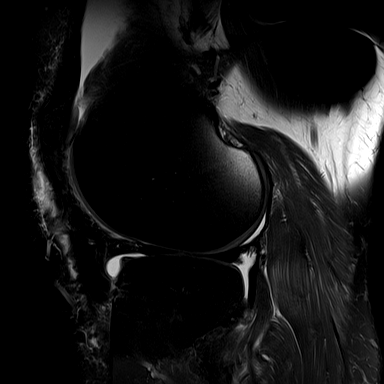
[im 30/30]
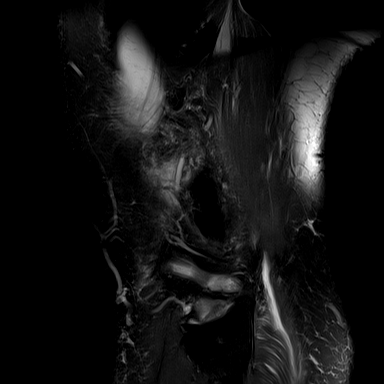

[Series 10: PD fat-sat · coronal · right · 3.0mm · 0.36mm/px · 8 of 36 slices shown (3 of 3)]
[im 1/36]
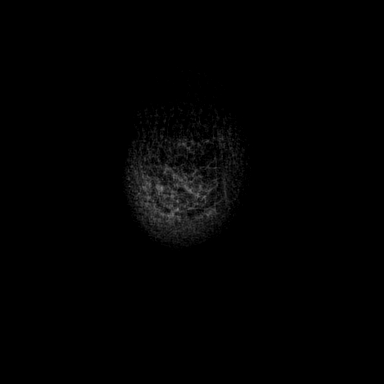
[im 6/36]
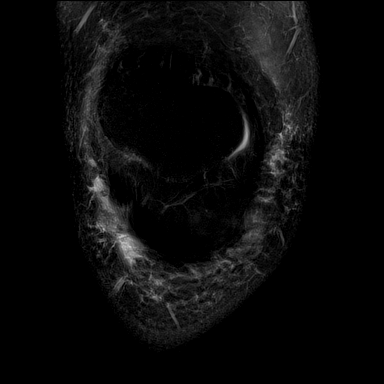
[im 11/36]
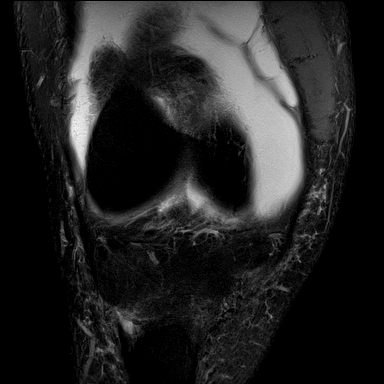
[im 16/36]
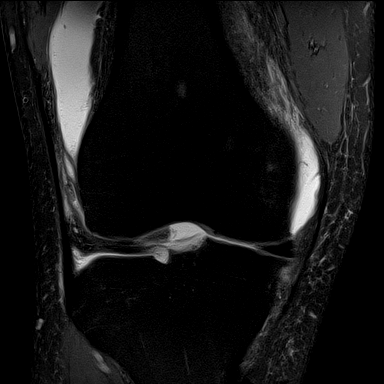
[im 21/36]
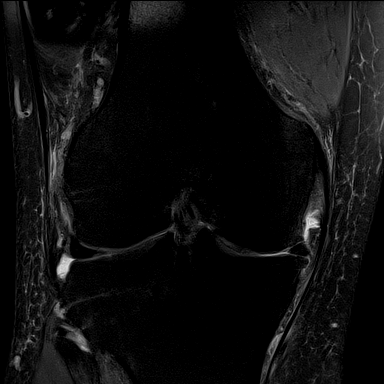
[im 26/36]
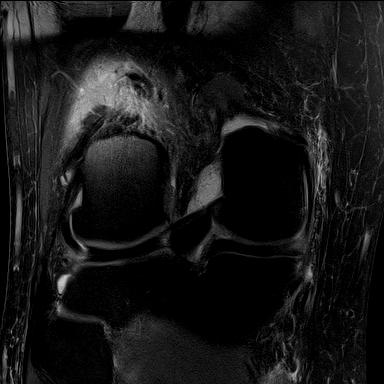
[im 31/36]
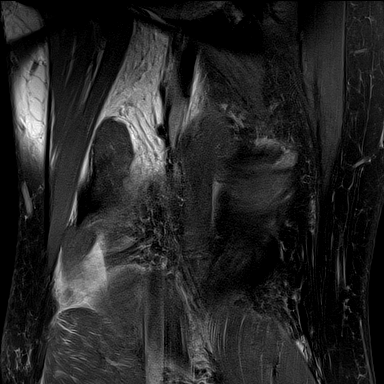
[im 36/36]
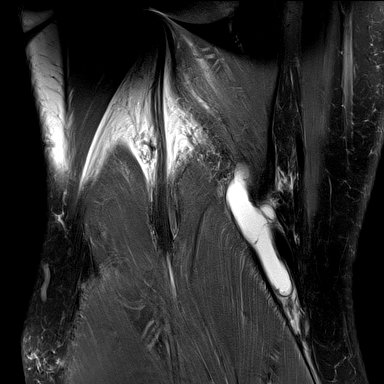

[Series 11: T2 fat-sat · coronal · right · 3.0mm · 0.42mm/px · 8 of 36 slices shown]
[im 1/36]
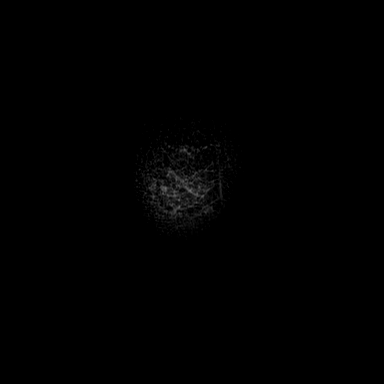
[im 6/36]
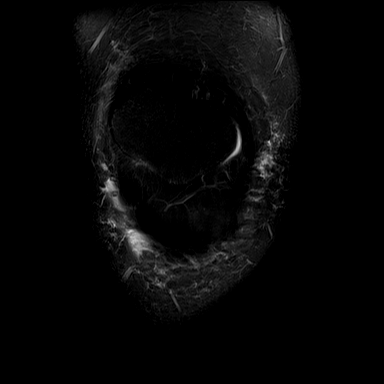
[im 11/36]
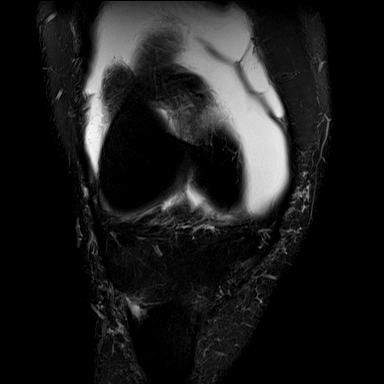
[im 16/36]
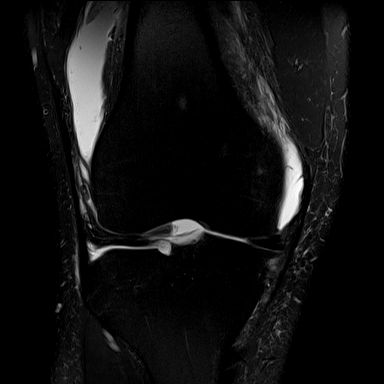
[im 21/36]
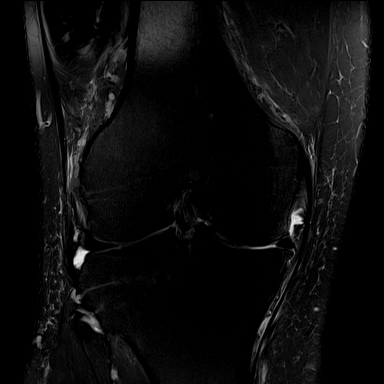
[im 26/36]
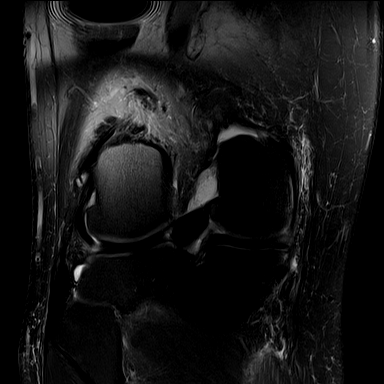
[im 31/36]
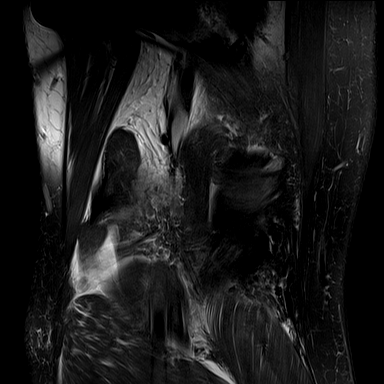
[im 36/36]
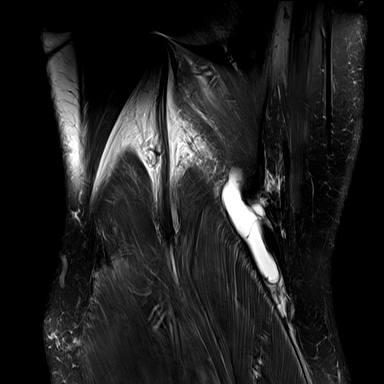

[Series 12: PD · coronal · right · 1.5mm · 0.36mm/px · 4 of 21 slices shown]
[im 1/21]
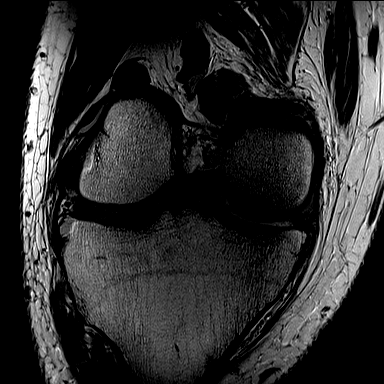
[im 7/21]
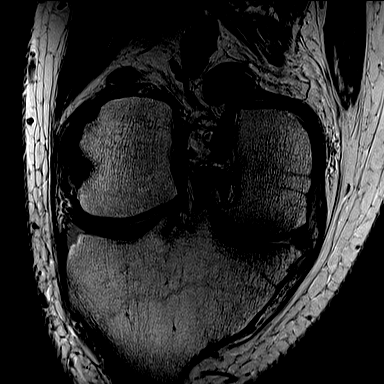
[im 14/21]
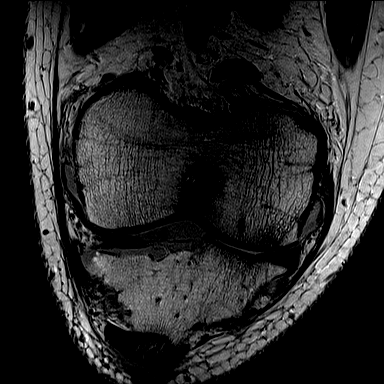
[im 21/21]
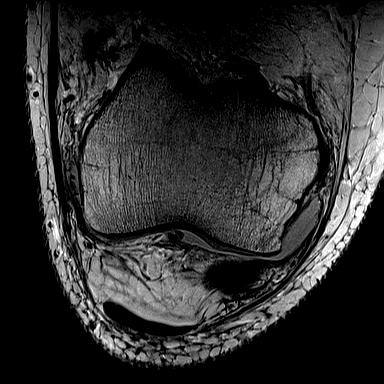

[32 of 40 positions shown; findings below may reference images not displayed]

FINDINGS: Metallic foreign body in the posterior distal thigh with
susceptibility artifact partially obscuring the adjacent soft tissue
and osseous structures.

MENISCI

Medial meniscus: Attenuation of the free edge of the posterior horn
of the medial meniscus as well as the free edge of the posterior
horn- body junction of the medial meniscus likely related to prior
meniscectomy. Oblique signal abnormality in the body of the medial
meniscus concerning for a small tear extending to the inferior
articular surface.

Lateral meniscus:  Intact.

LIGAMENTS

Cruciates:  Intact ACL and PCL.

Collaterals: Medial collateral ligament is intact. Lateral
collateral ligament complex is intact.

CARTILAGE

Patellofemoral: Partial thickness cartilage loss of the lateral
patellofemoral compartment with a full-thickness cartilage fissure
at the patellar apex. Partial thickness cartilage loss of the medial
patellofemoral compartment.

Medial: Partial thickness cartilage loss of the medial femoral
condyle and medial tibial plateau with marginal osteophytosis and
mild subchondral reactive marrow changes.

Lateral:  No chondral defect.

Joint: Moderate joint effusion. Mild edema in Hoffa's fat.
Prominent, mildly thickened medial patellar plica.

Popliteal Fossa:  Small Baker cyst.  Intact popliteus tendon.

Extensor Mechanism: Intact quadriceps tendon. Moderate tendinosis of
the distal patellar tendon at its insertion.

Bones:  No marrow signal abnormality.  No fracture or dislocation.
IMPRESSION: 1. Attenuation of the free edge of the posterior horn of the medial
meniscus as well as the free edge of the posterior horn- body
junction of the medial meniscus likely related to prior
meniscectomy. Oblique signal abnormality in the body of the medial
meniscus concerning for a small tear extending to the inferior
articular surface.
2. Partial thickness cartilage loss of the lateral patellofemoral
compartment with a full-thickness cartilage fissure at the patellar
apex. Partial thickness cartilage loss of the medial patellofemoral
compartment.
3. Partial thickness cartilage loss of the medial femoral condyle
and medial tibial plateau with marginal osteophytosis and mild
subchondral reactive marrow changes.
4. Moderate tendinosis of the distal patellar tendon at its
insertion.
5. Moderate joint effusion.
6. Small Baker cyst.
7. Mild edema in Hoffa's fat.

## 2016-05-30 LAB — AMYLASE, BODY FLUID: AMYLASE, BODY FLUID: <10 U/L

## 2016-05-30 LAB — CELL COUNT WITH DIFFERENTIAL, BODY FLUID
Body Fluid RBC: 1000 {cells}/uL
Body Fluid Wbc: 120 {cells}/uL
Mononuclear Cell, Absolute: 0.109 10*3/uL
Mononuclear Cell, Relative: 90.8 % — ABNORMAL LOW (ref 94.0–100.0)
Polynuclear Cell, Absolute: 0.011 10*3/uL
Polynuclear Cell, Relative: 9.2 % (ref 6.0–100.0)

## 2016-05-30 LAB — LDH, FLUID: LD, Fluid: 61 U/L

## 2016-05-30 LAB — CULTURE, BODY FLUID

## 2016-05-30 LAB — TOTAL PROTEIN, FLUID: Total Protein, Body Fluid: <3 g/dL

## 2016-05-30 LAB — GLUCOSE, BODY FLUID: Glucose, Fluid: 109 mg/dL

## 2016-05-31 LAB — COMPREHENSIVE METABOLIC PANEL
ALT: 33 U/L (ref 10–52)
AST: 43 U/L — ABNORMAL HIGH (ref 13–39)
Albumin/Globulin Ratio: 0.9 (ref 0.9–2.4)
Albumin: 3.1 g/dL — ABNORMAL LOW (ref 3.4–5.0)
Alkaline Phosphatase: 125 U/L — ABNORMAL HIGH (ref 45–117)
Anion Gap: 7 mmol/L — ABNORMAL LOW (ref 10–20)
BUN/Creatinine Ratio: 17 (ref 5–25)
Calcium: 8.8 mg/dL (ref 8.6–10.3)
Chloride: 103 mmol/L (ref 98–107)
Creatinine: 0.88 mg/dL (ref 0.50–1.30)
Gfr Calculated: >60
Glucose: 102 mg/dL — ABNORMAL HIGH (ref 70–100)
HCO3: 29 mmol/L (ref 21–32)
Potassium: 4.4 mmol/L (ref 3.5–5.1)
Sodium: 135 mmol/L — ABNORMAL LOW (ref 136–145)
Total Bilirubin: 0.6 mg/dL (ref 0.0–1.2)
Total Protein: 6.5 g/dL (ref 6.4–8.2)
Urea Nitrogen: 15 mg/dL (ref 6–23)

## 2016-05-31 LAB — CBC WITH MANUAL DIFFERENTIAL
Basophils Absolute: 0.04 10*3/uL (ref 0.01–0.09)
Basophils: 0.8 % (ref 0.0–1.3)
Eosinophils Absolute: 0.13 10*3/uL (ref 0.01–0.46)
Eosinophils: 2.5 % (ref 0.0–6.7)
Hematocrit: 43.1 % (ref 38.4–54.9)
Hemoglobin: 14.6 g/dL (ref 12.8–17.7)
Immature Grans (Abs): 0.02 10*3/uL (ref 0.00–0.21)
Immature Granulocytes: 0.4 %
Lymphocytes Absolute: 1.04 10*3/uL (ref 0.40–2.84)
Lymphocytes: 20.4 % (ref 9.4–41.1)
MCH: 28.2 pg (ref 27.5–32.9)
MCHC: 33.9 g/dL (ref 30.5–35.4)
MCV: 83.4 fL (ref 83.3–98.2)
MPV: 9.4 fL — ABNORMAL LOW (ref 9.9–12.1)
Monocytes Absolute: 0.59 10*3/uL (ref 0.25–1.33)
Monocytes: 11.5 % (ref 3.0–16.2)
Neutrophils Absolute: 3.29 10*3/uL (ref 2.22–7.53)
Neutrophils: 64.4 % (ref 46.2–79.1)
Nucleated RBCs: 0 /100{WBCs}
Platelets: 129 10*3/uL — ABNORMAL LOW (ref 155–404)
RBC: 5.17 10*6/uL (ref 4.08–6.37)
RBCs Counted: 0 10*3/uL
RDW-CV: 15.7 % — ABNORMAL HIGH (ref 12.0–15.4)
RDW-SD: 47.8 fL (ref 39.3–48.6)
WBC: 5.1 10*3/uL (ref 4.2–11.0)

## 2016-05-31 LAB — ALBUMIN, FLUID: Albumin, Body Fluid: 1.2 g/dL

## 2016-06-01 LAB — CYTOLOGY, NON-GYN

## 2016-06-01 LAB — SURGICAL PATHOLOGY

## 2016-06-27 ENCOUNTER — Encounter

## 2016-06-27 ENCOUNTER — Inpatient Hospital Stay: Payer: PRIVATE HEALTH INSURANCE | Primary: Family

## 2016-06-27 ENCOUNTER — Inpatient Hospital Stay: Admit: 2016-06-27 | Payer: PRIVATE HEALTH INSURANCE | Primary: Family

## 2016-06-27 DIAGNOSIS — K439 Ventral hernia without obstruction or gangrene: Secondary | ICD-10-CM

## 2016-06-27 LAB — CBC WITH AUTO DIFFERENTIAL
Basophils %: 0.8 % (ref 0.0–2.0)
Basophils Absolute: 0.05 E9/L (ref 0.00–0.20)
Eosinophils %: 2.4 % (ref 0.0–6.0)
Eosinophils Absolute: 0.16 E9/L (ref 0.05–0.50)
Hematocrit: 48.6 % (ref 37.0–54.0)
Hemoglobin: 16.3 g/dL (ref 12.5–16.5)
Immature Granulocytes #: 0.01 E9/L
Immature Granulocytes %: 0.2 % (ref 0.0–5.0)
Lymphocytes %: 18.8 % — ABNORMAL LOW (ref 20.0–42.0)
Lymphocytes Absolute: 1.25 E9/L — ABNORMAL LOW (ref 1.50–4.00)
MCH: 28 pg (ref 26.0–35.0)
MCHC: 33.5 % (ref 32.0–34.5)
MCV: 83.4 fL (ref 80.0–99.9)
MPV: 9.6 fL (ref 7.0–12.0)
Monocytes %: 7.4 % (ref 2.0–12.0)
Monocytes Absolute: 0.49 E9/L (ref 0.10–0.95)
Neutrophils %: 70.4 % (ref 43.0–80.0)
Neutrophils Absolute: 4.69 E9/L (ref 1.80–7.30)
Platelets: 161 E9/L (ref 130–450)
RBC: 5.83 E12/L — ABNORMAL HIGH (ref 3.80–5.80)
RDW: 15.3 fL — ABNORMAL HIGH (ref 11.5–15.0)
WBC: 6.7 E9/L (ref 4.5–11.5)

## 2016-06-27 LAB — COMPREHENSIVE METABOLIC PANEL, FASTING
ALT: 42 U/L — ABNORMAL HIGH (ref 0–40)
AST: 50 U/L — ABNORMAL HIGH (ref 0–39)
Albumin: 3.9 g/dL (ref 3.5–5.2)
Alkaline Phosphatase: 165 U/L — ABNORMAL HIGH (ref 40–129)
Anion Gap: 14 mmol/L (ref 7–16)
BUN: 15 mg/dL (ref 6–20)
CO2: 24 mmol/L (ref 22–29)
Calcium: 9.2 mg/dL (ref 8.6–10.2)
Chloride: 101 mmol/L (ref 98–107)
Creatinine: 0.8 mg/dL (ref 0.7–1.2)
GFR African American: >60
GFR Non-African American: >60 mL/min/{1.73_m2} (ref >=60)
Glucose, Fasting: 139 mg/dL — ABNORMAL HIGH (ref 74–109)
Potassium: 4.2 mmol/L (ref 3.5–5.0)
Sodium: 139 mmol/L (ref 132–146)
Total Bilirubin: 0.6 mg/dL (ref 0.0–1.2)
Total Protein: 8.3 g/dL (ref 6.4–8.3)

## 2016-06-27 LAB — C-REACTIVE PROTEIN: CRP: 0.3 mg/dL (ref 0.0–0.4)

## 2016-06-27 LAB — SEDIMENTATION RATE: Sed Rate: 9 mm/Hr (ref 0–15)

## 2016-06-28 LAB — HEPATITIS PANEL, ACUTE
Hep A IgM: NONREACTIVE
Hep B Core Ab, IgM: NONREACTIVE
Hep B S Ag Interp: REACTIVE — AB
Hep C Ab Interp: REACTIVE — AB

## 2016-06-28 LAB — RPR: RPR: NONREACTIVE

## 2016-06-28 LAB — HIV SCREEN: HIV-1/HIV-2 Ab: NONREACTIVE

## 2016-07-02 ENCOUNTER — Institutional Professional Consult (permissible substitution): Admit: 2016-07-02 | Discharge: 2016-07-02 | Payer: PRIVATE HEALTH INSURANCE | Attending: Surgery | Primary: Family

## 2016-07-02 DIAGNOSIS — K432 Incisional hernia without obstruction or gangrene: Secondary | ICD-10-CM

## 2016-07-02 NOTE — Progress Notes (Signed)
General Surgery History and Physical    Patient's Name/Date of Birth: Travis Ortega / 1970-05-27    Date: July 02, 2016     Surgeon: Haskell Flirt M.D.    PCP: Yaakov Guthrie, CNP     Chief Complaint: incisional hernia    HPI:   Travis Ortega is a 46 y.o. male who presents for evaluation of recurrent incisional hernia that is from a strangulated umbilical hernia that was primarily repaired after bowel resection. It has become larger and more painful recently and the pt wants repair.    Past Medical History:   Diagnosis Date   . Cirrhosis (HCC)    . Hepatitis B    . Hepatitis C        Past Surgical History:   Procedure Laterality Date   . HERNIA REPAIR     . KNEE SURGERY Right     arthroscopy        Current Outpatient Prescriptions   Medication Sig Dispense Refill   . spironolactone (ALDACTONE) 50 MG tablet TK 1 T PO BID  0   . tenofovir (VIREAD) 300 MG tablet TK 1 T PO D  0   . furosemide (LASIX) 20 MG tablet Take 20 mg by mouth 3 times daily       No current facility-administered medications for this visit.        Allergies   Allergen Reactions   . Morphine    . Pcn [Penicillins]    . Tylenol [Acetaminophen]        The patient has a family history that is negative for severe cardiovascular or respiratory issues, negative for reaction to anesthesia.    Social History     Social History   . Marital status: Single     Spouse name: N/A   . Number of children: N/A   . Years of education: N/A     Occupational History   . Not on file.     Social History Main Topics   . Smoking status: Current Every Day Smoker     Packs/day: 0.50     Types: Cigarettes   . Smokeless tobacco: Never Used   . Alcohol use No   . Drug use: No   . Sexual activity: Not on file     Other Topics Concern   . Not on file     Social History Narrative   . No narrative on file           Review of Systems  Review of Systems -  General ROS: negative for - chills, fatigue or malaise  ENT ROS: negative for - hearing change, nasal congestion or nasal  discharge  Allergy and Immunology ROS: negative for - hives, itchy/watery eyes or nasal congestion  Hematological and Lymphatic ROS: negative for - blood clots, blood transfusions, bruising or fatigue  Endocrine ROS: negative for - malaise/lethargy, mood swings, palpitations or polydipsia/polyuria  Respiratory ROS: negative for - sputum changes, stridor, tachypnea or wheezing  Cardiovascular ROS: negative for - irregular heartbeat, loss of consciousness, murmur or orthopnea  Gastrointestinal ROS: negative for - constipation, diarrhea, gas/bloating, heartburn or hematemesis  Genito-Urinary ROS: negative for -  genital discharge, genital ulcers or hematuria  Musculoskeletal ROS: negative for - gait disturbance, muscle pain or muscular weakness    Physical exam:   BP 125/77 (Site: Right Arm, Position: Sitting, Cuff Size: Medium Adult)   Pulse 74   Temp 99.3 F (37.4 C) (Temporal)   Ht 6\' 1"  (  1.854 m)   Wt 212 lb (96.2 kg)   BMI 27.97 kg/m   General appearance:  NAD  Psycho/social: negative for tremor or hallucinations  Head: NCAT, PERRLA, EOMI, red conjunctiva  Neck: supple, no masses  Lungs: CTAB, equal chest rise bilateral  Heart: Reg rate  Abdomen: soft, nontender, nondistended, incisional hernia at periumbilical area and 3 cm in size  Skin; no lesions  Gu: no cva tenderness  Extremities: extremities normal, atraumatic, no cyanosis or edema        Assessment:  46 y.o. male with recurrent incisional hernia    Plan:  Will plan for lap possible open incisional hernia  Discussed the risk, benefits and alternatives of surgery including wound infections, bleeding, scar and hernia formation and the risks of general anesthetic including MI, CVA, sudden death or reactions to anesthetic medications. The patient understands the risks and alternatives and the possibility of converting to an open procedure. All questions were answered to the patient's satisfaction and they freely signed the consent.      Loreli Slot,  MD  2:59 PM  07/02/2016

## 2016-07-03 NOTE — Telephone Encounter (Signed)
Called and spoke to [place] at Surg scheduling at [facility].  Pt scheduled for lap, incisional hernia repair with mesh, poss open, CPT# 49654, on 3.26.18.  Pt given written and verbal instructions.  Dr Daphine DeutscherMartin to [facility].  Pt advised to call with any questions or concerns. Pt was agreeable and verbalized understanding.    Pt has traditional medicaid and will not require prior authorization.    Electronically signed by Rochele PagesKathy Carloyn Lahue, MA on 07/03/16 at 4:39 PM

## 2016-07-06 NOTE — Progress Notes (Signed)
[facility]                                                                                                                     PRE OP INSTRUCTIONS FOR  Travis HickmanRonnie Ortega        Date: 07/06/2016    Date and time of surgery: 07/09/16  1015   Arrival Time: 0845 in the main lobby    1. Do not eat or drink anything after 12 midnight prior to surgery. This includes no water, chewing gum, mints or ice chips.    2. Take the following pills with a small sip of water on the morning of Surgery:  Baclofen if scheduled and Zofran prn    3. Diabetics may take evening dose of insulin but none after midnight.  If you feel symptomatic or low blood sugar take 1-2 ounces of apple juice only.    4. Aspirin, Ibuprofen, Advil, Naproxen, Vitamin E and other Anti-inflammatory products should be stopped  before surgery  as directed by your physician.    5. Check with your Doctor regarding stopping Plavix, Coumadin, Lovenox, Fragmin or other blood thinners.    6. Do not smoke,use illicit drugs and do not drink any alcoholic beverages 24 hours prior to surgery.    7. You may brush your teeth and gargle the morning of surgery.  DO NOT SWALLOW WATER    8. You MUST make arrangements for a responsible adult to take you home after your surgery. You will not be allowed to leave alone or drive yourself home.  It is strongly suggested someone stay with you the first 24 hrs. Your surgery will be cancelled if you do not have a ride home.    9. A parent/legal guardian must accompany a child scheduled for surgery and plan to stay at the hospital until the child is discharged.  Please do not bring other children with you.    10. Please wear simple, loose fitting clothing to the hospital.  Do not bring valuables (money, credit cards, checkbooks, etc.) Do not wear any makeup (including no eye makeup) or nail polish on your fingers or toes.    11. DO NOT wear any jewelry or piercings on day of surgery.  All body piercing jewelry must be  removed.    12. Shower the night before surgery with _x__Antibacterial soap ___Hibiclens.    13. Remember to bring Blood Bank bracelet to the hospital on the day of surgery.    14. If you have a Living Will and Durable Power of Attorney for Healthcare, please bring in a copy.    15. If appropriate bring crutches, inspirex, etc...    16. Notify your Surgeon if you develop any illness between now and surgery time, cough, cold, fever, sore throat, nausea, vomiting, etc.  Please notify your surgeon if you experience dizziness, shortness of breath or blurred vision between now & the time of your surgery.    17. If you have ___dentures, they  will be removed before going to the OR; we will provide you a container. If you wear ___contact lenses or ___glasses, they will be removed; please bring a case for them.    18. To provide excellent care visitors will be limited to one in the room at any given time.    19. Please bring picture ID and insurance card.    20. Sleep apnea patients need to bring CPAP to hospital on day of surgery.                                                                                         21. Visit our web site for additional information: [email or web address]    22. During flu season no children under the age of 33 are permitted in the hospital for the safety of all patients.     23. Other                  Please call pre admission testing if you have any further questions.   [place]     [place]

## 2016-07-09 ENCOUNTER — Inpatient Hospital Stay: Payer: PRIVATE HEALTH INSURANCE

## 2016-07-09 MED ORDER — MIDAZOLAM HCL 2 MG/2ML IJ SOLN
2 MG/ML | INTRAMUSCULAR | Status: DC | PRN
Start: 2016-07-09 — End: 2016-07-09
  Administered 2016-07-09: 15:00:00 2 via INTRAVENOUS

## 2016-07-09 MED ORDER — GLYCOPYRROLATE 0.2 MG/ML IJ SOLN
0.2 MG/ML | INTRAMUSCULAR | Status: DC | PRN
Start: 2016-07-09 — End: 2016-07-09
  Administered 2016-07-09: 15:00:00 0.4 via INTRAVENOUS

## 2016-07-09 MED ORDER — LACTATED RINGERS IV SOLN
INTRAVENOUS | Status: DC
Start: 2016-07-09 — End: 2016-07-09
  Administered 2016-07-09: 14:00:00 via INTRAVENOUS

## 2016-07-09 MED ORDER — KETOROLAC TROMETHAMINE 30 MG/ML IJ SOLN
30 MG/ML | INTRAMUSCULAR | Status: DC | PRN
Start: 2016-07-09 — End: 2016-07-09
  Administered 2016-07-09: 15:00:00 30 via INTRAVENOUS

## 2016-07-09 MED ORDER — OXYCODONE HCL 5 MG PO TABS
5 MG | ORAL | Status: AC | PRN
Start: 2016-07-09 — End: 2016-07-09
  Administered 2016-07-09: 17:00:00 5 mg via ORAL

## 2016-07-09 MED ORDER — HYDROMORPHONE HCL 2 MG/ML IJ SOLN
2 MG/ML | INTRAMUSCULAR | Status: AC | PRN
Start: 2016-07-09 — End: 2016-07-09
  Administered 2016-07-09 (×4): 0.5 mg via INTRAVENOUS

## 2016-07-09 MED ORDER — BUPIVACAINE-EPINEPHRINE (PF) 0.25% -1:200000 IJ SOLN
INTRAMUSCULAR | Status: AC
Start: 2016-07-09 — End: 2016-07-09

## 2016-07-09 MED ORDER — IBUPROFEN 800 MG PO TABS
800 MG | ORAL_TABLET | Freq: Four times a day (QID) | ORAL | 0 refills | Status: DC | PRN
Start: 2016-07-09 — End: 2018-06-15

## 2016-07-09 MED ORDER — OXYCODONE HCL 5 MG PO TABS
5 MG | ORAL_TABLET | Freq: Four times a day (QID) | ORAL | 0 refills | Status: AC | PRN
Start: 2016-07-09 — End: 2016-07-16

## 2016-07-09 MED ORDER — NORMAL SALINE FLUSH 0.9 % IV SOLN
0.9 % | INTRAVENOUS | Status: DC | PRN
Start: 2016-07-09 — End: 2016-07-09

## 2016-07-09 MED ORDER — PROPOFOL 200 MG/20ML IV EMUL
200 MG/20ML | INTRAVENOUS | Status: DC | PRN
Start: 2016-07-09 — End: 2016-07-09
  Administered 2016-07-09: 15:00:00 200 via INTRAVENOUS

## 2016-07-09 MED ORDER — CEFAZOLIN SODIUM-DEXTROSE 2-3 GM-% IV SOLR
2-3 GM-% | INTRAVENOUS | Status: DC
Start: 2016-07-09 — End: 2016-07-09

## 2016-07-09 MED ORDER — NORMAL SALINE FLUSH 0.9 % IV SOLN
0.9 % | Freq: Two times a day (BID) | INTRAVENOUS | Status: DC
Start: 2016-07-09 — End: 2016-07-09

## 2016-07-09 MED ORDER — DEXAMETHASONE SOD PHOSPHATE PF 10 MG/ML IJ SOLN
10 MG/ML | INTRAMUSCULAR | Status: DC | PRN
Start: 2016-07-09 — End: 2016-07-09
  Administered 2016-07-09: 15:00:00 10 via INTRAVENOUS

## 2016-07-09 MED ORDER — FENTANYL CITRATE (PF) 250 MCG/5ML IJ SOLN
250 MCG/5ML | INTRAMUSCULAR | Status: DC | PRN
Start: 2016-07-09 — End: 2016-07-09
  Administered 2016-07-09: 15:00:00 50 via INTRAVENOUS
  Administered 2016-07-09: 15:00:00 100 via INTRAVENOUS

## 2016-07-09 MED ORDER — NEOSTIGMINE METHYLSULFATE 1 MG/ML IJ SOLN
1 MG/ML | INTRAMUSCULAR | Status: DC | PRN
Start: 2016-07-09 — End: 2016-07-09
  Administered 2016-07-09: 15:00:00 3 via INTRAVENOUS

## 2016-07-09 MED ORDER — ROCURONIUM BROMIDE 50 MG/5ML IV SOLN
50 MG/5ML | INTRAVENOUS | Status: DC | PRN
Start: 2016-07-09 — End: 2016-07-09
  Administered 2016-07-09: 15:00:00 40 via INTRAVENOUS

## 2016-07-09 MED ORDER — OXYCODONE HCL 5 MG PO TABS
5 | ORAL | Status: AC
Start: 2016-07-09 — End: 2016-07-09

## 2016-07-09 MED ORDER — LIDOCAINE HCL 2 % IJ SOLN
2 % | INTRAMUSCULAR | Status: DC | PRN
Start: 2016-07-09 — End: 2016-07-09
  Administered 2016-07-09: 15:00:00 100 via INTRAVENOUS

## 2016-07-09 MED ORDER — LACTATED RINGERS IV SOLN
INTRAVENOUS | Status: DC
Start: 2016-07-09 — End: 2016-07-09
  Administered 2016-07-09: 15:00:00 via INTRAVENOUS

## 2016-07-09 MED ORDER — ONDANSETRON HCL 4 MG/2ML IJ SOLN
4 MG/2ML | INTRAMUSCULAR | Status: DC | PRN
Start: 2016-07-09 — End: 2016-07-09
  Administered 2016-07-09: 15:00:00 4 via INTRAVENOUS

## 2016-07-09 MED ORDER — BUPIVACAINE-EPINEPHRINE (PF) 0.25% -1:200000 IJ SOLN
INTRAMUSCULAR | Status: DC | PRN
Start: 2016-07-09 — End: 2016-07-09
  Administered 2016-07-09: 15:00:00 30 via INTRADERMAL

## 2016-07-09 MED ORDER — CLINDAMYCIN PHOSPHATE IN D5W 600 MG/50ML IV SOLN
600 MG/50ML | Freq: Three times a day (TID) | INTRAVENOUS | Status: AC
Start: 2016-07-09 — End: 2016-07-09
  Administered 2016-07-09: 15:00:00 600 mg via INTRAVENOUS

## 2016-07-09 MED FILL — CLEOCIN IN D5W 600 MG/50ML IV SOLN: 600 MG/50ML | INTRAVENOUS | Qty: 50

## 2016-07-09 MED FILL — OXYCODONE HCL 5 MG PO TABS: 5 MG | ORAL | Qty: 1

## 2016-07-09 MED FILL — CEFAZOLIN SODIUM-DEXTROSE 2-3 GM-% IV SOLR: 2-3 GM-% | INTRAVENOUS | Qty: 50

## 2016-07-09 MED FILL — MARCAINE/EPINEPHRINE PF 0.25% -1:200000 IJ SOLN: INTRAMUSCULAR | Qty: 30

## 2016-07-09 NOTE — Discharge Instructions (Signed)
Patient Discharge Instructions  Discharge Date:  07/09/2016    Discharged To:  Home    RESUME ACTIVITY:      BATHING:  May shower 24hrs after surgery, may bathe 3 days after surgery    DRIVING: No driving while on pain medications    RETURN TO WORK: after follow up appointment    WALKING:  Yes    SEXUAL ACTIVITY: Yes    STAIRS:  Yes    LIFTING: Less than 15 pounds for 4weeks    DIET: common adult    BOWELS: constipation is a side effect of your pain meds, take a daily laxative (miralax, dulcolax, etc.) as needed to keep your bowels moving as they normally do, do not go 2-3 days without having a bowel movement.    SPECIAL INSTRUCTIONS:     Call the office at [phone] if you have a fever > 100 F, or if your incision becomes red, tender, or drains more than a small amount of clear fluid.    Call  for follow up appointment with Dr. Daphine DeutscherMartin in:  2weeks         oxycodone  Pronunciation:  ox i KOE done  Brand:  Oxaydo, OxyCONTIN, Oxyfast, Roxicodone, Xtampza ER  What is the most important information I should know about oxycodone?  You should not use oxycodone if you have severe asthma or breathing problems, or a blockage in your stomach or intestines.  Oxycodone can slow or stop your breathing, and may be habit-forming. MISUSE OF THIS MEDICINE CAN CAUSE ADDICTION, OVERDOSE, OR DEATH, especially in a child or other person using the medicine without a prescription.  Taking this medicine during pregnancy may cause life-threatening withdrawal symptoms in the newborn.  Fatal side effects can occur if you use this medicine with alcohol, or with other drugs that cause drowsiness or slow your breathing.  What is oxycodone?  Oxycodone is an opioid pain medication. An opioid is sometimes called a narcotic. Oxycodone is used to treat moderate to severe pain.  The extended-release form of this medicine is for around-the-clock treatment of pain. This form of oxycodone is not  for use on an as-needed basis for pain.  Oxycodone may  also be used for purposes not listed in this medication guide.  What should I discuss with my healthcare provider before using oxycodone?  You should not use oxycodone if you are allergic to it, or if you have:   severe asthma or breathing problems; or   a blockage in your stomach or intestines.  You should not use oxycodone unless you are already using a similar opioid medicine and are tolerant to it.  Most brands of oxycodone are not approved for use in people under 18. OxyContin should not be given to a child younger than 44 years old.  To make sure oxycodone is safe for you, tell your doctor if you have:   a history of head injury, brain tumor, or seizures;   a history of drug abuse, alcohol addiction, or mental illness;   urination problems;   liver or kidney disease;   problems with your gallbladder, pancreas, or thyroid; or   if you use a sedative like Valium (diazepam, alprazolam, lorazepam, Ativan, Klonopin, Restoril, Tranxene, Versed, Xanax, and others).  If you use oxycodone while you are pregnant, your baby could become dependent on the drug.  This can cause life-threatening withdrawal symptoms in the baby after it is born. Babies born dependent on habit-forming medicine may need  medical treatment for several weeks.  Long-term use of opioid medication may affect fertility (ability to have children) in men or women. It is not known whether opioid effects on fertility are permanent.  Do not breast-feed. Oxycodone can pass into breast milk and may cause drowsiness or breathing problems in a nursing baby.  How should I use oxycodone?  Follow all directions on your prescription label. Oxycodone can slow or stop your breathing, especially when you start using this medicine or whenever your dose is changed. Never take oxycodone in larger amounts, or for longer than prescribed. Tell your doctor if the medicine seems to stop working as well in relieving your pain.  Oxycodone may be habit-forming. Never  share this medicine with another person, especially someone with a history of drug abuse or addiction. MISUSE OF NARCOTIC MEDICINE CAN CAUSE ADDICTION, OVERDOSE, OR DEATH, especially in a child or other person using the medicine without a prescription. Selling or giving away oxycodone is against the law.  Stop taking all other around-the-clock narcotic pain medicines when you start taking extended-release oxycodone..  Take oxycodone with food.  Do not crush, break, or open an extended-release tablet. Swallow it whole to avoid exposure to a potentially fatal dose.  Measure liquid medicine with the dosing syringe provided, or with a special dose-measuring spoon or medicine cup. If you do not have a dose-measuring device, ask your pharmacist for one.  You should not stop using oxycodone suddenly. Follow your doctor's instructions about tapering your dose.  Never crush or break an oxycodone pill to inhale the powder or mix it into a liquid to inject the drug into your vein. This can cause in death.  Store at room temperature, away from heat, moisture, and light.  Keep track of the amount of medicine used from each new bottle. Oxycodone is a drug of abuse and you should be aware if anyone is using your medicine improperly or without a prescription.  Do not keep leftover oxycodone pills or liquid. Ask your pharmacist where to locate a drug take-back disposal program. If there is no take-back program, flush any unused pills or liquid medicine down the toilet.  What happens if I miss a dose?  Since oxycodone is used for pain, you are not likely to miss a dose. Skip any missed dose if it is almost time for your next scheduled dose. Do not use extra medicine to make up the missed dose.  What happens if I overdose?  Seek emergency medical attention or call the Poison Help line at [phone]. An oxycodone overdose can be fatal, especially in a child or other person using the medicine without a prescription.  Overdose  symptoms may include slow heartbeats, severe muscle weakness, cold and clammy skin, noisy breathing, and very slow breathing.  What should I avoid while using oxycodone?  Do not drink alcohol. Dangerous side effects or death could occur.  Avoid driving or operating machinery until you know how oxycodone will affect you. Dizziness or severe drowsiness can cause falls or other accidents.  Avoid medication errors. Always check the brand and strength of oxycodone you get from the pharmacy.  What are the possible side effects of oxycodone?  Get emergency medical help if you have signs of an allergic reaction: hives; difficult breathing; swelling of your face, lips, tongue, or throat.  Like other narcotic medicines, oxycodone can slow your breathing. Death may occur if breathing becomes too weak.  A person caring for you should seek emergency medical attention  if you have slow breathing with long pauses, blue colored lips, pinpoint pupils, or if you are hard to wake up.  Call your doctor at once if you have:   shallow breathing, slow heartbeats;   confusion, severe drowsiness, seizure (convulsions);   severe constipation;   missed menstrual periods;   impotence, sexual problems, loss of interest in sex;   a light-headed feeling, like you might pass out; or   low cortisol levels --nausea, vomiting, loss of appetite, dizziness, worsening tiredness or weakness.  Seek medical attention right away if you have symptoms of serotonin syndrome, such as: agitation, confusion, fever, sweating, fast heart rate, chest pain, feeling short of breath, muscle stiffness, trouble walking, or feeling faint.  Serious side effects may be more likely in older adults and those who are malnourished or debilitated.  Common side effects may include:   drowsiness, headache, dizziness, tired feeling; or   constipation, stomach pain, nausea, vomiting, loss of appetite.   dry mouth; or   mild itching.  This is not a complete list of side  effects and others may occur. Call your doctor for medical advice about side effects. You may report side effects to FDA at 1-800-FDA-1088.  What other drugs will affect oxycodone?  Some drugs can raise or lower your blood levels of oxycodone, which may cause side effects or make oxycodone less effective. Tell your doctor if you also use certain antibiotics, antifungal medications, heart or blood pressure medications, or medicines to treat HIV or AIDS.  Narcotic (opioid) medication can interact with many other drugs and cause dangerous side effects or death. Be sure your doctor knows if you also use:   other narcotic medications --opioid pain medicine or prescription cough medicine;   drugs that make you sleepy or slow your breathing --a sleeping pill, muscle relaxer, sedative, tranquilizer, or antipsychotic medicine; or   drugs that affect serotonin levels in your body --medicine for depression, Parkinson's disease, migraine headaches, serious infections, or prevention of nausea and vomiting.  This list is not complete. Other drugs may interact with oxycodone, including prescription and over-the-counter medicines, vitamins, and herbal products. Not all possible interactions are listed in this medication guide.   Where can I get more information?  Your pharmacist can provide more information about oxycodone.    Remember, keep this and all other medicines out of the reach of children, never share your medicines with others, and use this medication only for the indication prescribed.  Every effort has been made to ensure that the information provided by [employer] ('[employer]') is accurate, up-to-date, and complete, but no guarantee is made to that effect. Drug information contained herein may be time sensitive. [employer] information has been compiled for use by healthcare practitioners and consumers in the [place] and therefore [employer] does not warrant that uses outside of the [place] are  appropriate, unless specifically indicated otherwise. [employer]'s drug information does not endorse drugs, diagnose patients or recommend therapy. [employer]'s drug information is an [employer] corporate to assist licensed healthcare practitioners in caring for their patients and/or to serve consumers viewing this service as a supplement to, and not a substitute for, the expertise, skill, knowledge and judgment of healthcare practitioners. The absence of a warning for a given drug or drug combination in no way should be construed to indicate that the drug or drug combination is safe, effective or appropriate for any given patient. [employer] does not assume any responsibility for any aspect of healthcare administered with  the aid of information [employer] provides. The information contained herein is not intended to cover all possible uses, directions, precautions, warnings, drug interactions, allergic reactions, or adverse effects. If you have questions about the drugs you are taking, check with your doctor, nurse or pharmacist.  Copyright [phone] [employer] Version: 11.01. Revision date: 08/19/2015.  Care instructions adapted under license by [facility]. If you have questions about a medical condition or this instruction, always ask your healthcare professional. [employer] disclaims any warranty or liability for your use of this information.

## 2016-07-09 NOTE — H&P (Signed)
General Surgery History and Physical  ??  Patient's Name/Date of Birth: Travis Ortega / [birth year]  ??  Date: July 09, 2016   ??  Surgeon: Haskell FlirtJoseph Shaquanna Lycan M.D.  ??  PCP: Yaakov Guthriehristopher M Broderick, CNP     Chief Complaint: incisional hernia  ??  HPI:   Travis HickmanRonnie Szuch is a 46 y.o. male who presents for evaluation of recurrent incisional hernia that is from a strangulated umbilical hernia that was primarily repaired after bowel resection. It has become larger and more painful recently and the pt wants repair.  ??  Past Medical History        Past Medical History:   Diagnosis Date   ??? Cirrhosis (HCC) ??   ??? Hepatitis B ??   ??? Hepatitis C ??      ??  ??  Past Surgical History   Past Surgical History:   Procedure Laterality Date   ??? HERNIA REPAIR ?? ??   ??? KNEE SURGERY Right ??   ?? arthroscopy       ??  ??  Current Facility-Administered Medications          Current Outpatient Prescriptions   Medication Sig Dispense Refill   ??? spironolactone (ALDACTONE) 50 MG tablet TK 1 T PO BID ?? 0   ??? tenofovir (VIREAD) 300 MG tablet TK 1 T PO D ?? 0   ??? furosemide (LASIX) 20 MG tablet Take 20 mg by mouth 3 times daily ?? ??   ??  No current facility-administered medications for this visit.       ??  ??       Allergies   Allergen Reactions   ??? Morphine ??   ??? Pcn [Penicillins] ??   ??? Tylenol [Acetaminophen] ??   ??  ??  The patient has a family history that is negative for severe cardiovascular or respiratory issues, negative for reaction to anesthesia.  ??  Social History   Social History   ??        Social History   ??? Marital status: Single   ?? ?? Spouse name: N/A   ??? Number of children: N/A   ??? Years of education: N/A   ??      Occupational History   ??? Not on file.   ??        Social History Main Topics   ??? Smoking status: Current Every Day Smoker   ?? ?? Packs/day: 0.50   ?? ?? Types: Cigarettes   ??? Smokeless tobacco: Never Used   ??? Alcohol use No   ??? Drug use: No   ??? Sexual activity: Not on file   ??       Other Topics Concern   ??? Not on file   ??      Social History  Narrative   ??? No narrative on file      ??  ??  ??  ??  Review of Systems  Review of Systems -  General ROS: negative for - chills, fatigue or malaise  ENT ROS: negative for - hearing change, nasal congestion or nasal discharge  Allergy and Immunology ROS: negative for - hives, itchy/watery eyes or nasal congestion  Hematological and Lymphatic ROS: negative for - blood clots, blood transfusions, bruising or fatigue  Endocrine ROS: negative for - malaise/lethargy, mood swings, palpitations or polydipsia/polyuria  Respiratory ROS: negative for - sputum changes, stridor, tachypnea or wheezing  Cardiovascular ROS: negative for - irregular heartbeat, loss of consciousness, murmur or orthopnea  Gastrointestinal ROS: negative for - constipation, diarrhea, gas/bloating, heartburn or hematemesis  Genito-Urinary ROS: negative for -  genital discharge, genital ulcers or hematuria  Musculoskeletal ROS: negative for - gait disturbance, muscle pain or muscular weakness  ??  Physical exam:   BP 125/77 (Site: Right Arm, Position: Sitting, Cuff Size: Medium Adult)    Pulse 74    Temp 99.3 ??F (37.4 ??C) (Temporal)    Ht 6\' 1"  (1.854 m)    Wt 212 lb (96.2 kg)    BMI 27.97 kg/m??   General appearance:  NAD  Psycho/social: negative for tremor or hallucinations  Head: NCAT, PERRLA, EOMI, red conjunctiva  Neck: supple, no masses  Lungs: CTAB, equal chest rise bilateral  Heart: Reg rate  Abdomen: soft, nontender, nondistended, incisional hernia at periumbilical area and 3 cm in size  Skin; no lesions  Gu: no cva tenderness  Extremities: extremities normal, atraumatic, no cyanosis or edema  ??  ??  ??  Assessment:  46 y.o. male with recurrent incisional hernia  ??  Plan:  Will plan for lap possible open incisional hernia  Discussed the risk, benefits and alternatives of surgery including wound infections, bleeding, scar and hernia formation and the risks of general anesthetic including MI, CVA, sudden death or reactions to anesthetic medications. The  patient understands the risks and alternatives and the possibility of converting to an open procedure. All questions were answered to the patient's satisfaction and they freely signed the consent.  ??  ??  Loreli Slot, MD

## 2016-07-09 NOTE — Anesthesia Pre-Procedure Evaluation (Signed)
Department of Anesthesiology  Preprocedure Note       Name:  Travis Ortega   Age:  46 y.o.  DOB:  10-27-1970                                          MRN:  [id number]         Date:  07/09/2016      Surgeon: Moishe Spice):  Loreli Slot, MD    Procedure: Procedure(s):  LAPAROSCOPIC INCISIONAL HERNIA REPAIR WITH MESH. POSS OPEN.    Medications prior to admission:   Prior to Admission medications    Medication Sig Start Date End Date Taking? Authorizing Provider   triamcinolone (KENALOG) 0.025 % cream APP EXT AA BID 06/03/16  Yes Historical Provider, MD   polyethylene glycol (GLYCOLAX) powder take 17GM (DISSOLVED IN WATER) by mouth once daily 06/29/16  Yes Historical Provider, MD   ondansetron (ZOFRAN) 4 MG tablet Take 4 mg by mouth Daily  06/16/16  Yes Historical Provider, MD   baclofen (LIORESAL) 10 MG tablet Take 10 mg by mouth twice a week  06/16/16  Yes Historical Provider, MD   tenofovir (VIREAD) 300 MG tablet TK 1 T PO Daily 06/04/16  Yes Historical Provider, MD   furosemide (LASIX) 20 MG tablet Take 20 mg by mouth three times a week    Yes Historical Provider, MD   pantoprazole (PROTONIX) 40 MG tablet take 1 tablet by mouth daily 06/29/16   Historical Provider, MD       Current medications:    Current Facility-Administered Medications   Medication Dose Route Frequency Provider Last Rate Last Dose   ??? lactated ringers infusion   Intravenous Continuous Virgilio Frees, MD       ??? ceFAZolin (ANCEF) 2 g in dextrose 3 % 50 mL IVPB (duplex)  2 g Intravenous On Call to OR Loreli Slot, MD       ??? lactated ringers infusion   Intravenous Continuous Loreli Slot, MD       ??? sodium chloride flush 0.9 % injection 10 mL  10 mL Intravenous 2 times per day Loreli Slot, MD       ??? sodium chloride flush 0.9 % injection 10 mL  10 mL Intravenous PRN Loreli Slot, MD           Allergies:    Allergies   Allergen Reactions   ??? Morphine Swelling   ??? Penicillin G Swelling   ??? Tylenol [Acetaminophen] Rash       Problem List:  There is  no problem list on file for this patient.      Past Medical History:        Diagnosis Date   ??? Cirrhosis (HCC)    ??? Hepatitis B    ??? Hepatitis C        Past Surgical History:        Procedure Laterality Date   ??? HERNIA REPAIR     ??? KNEE SURGERY Right     arthroscopy        Social History:    Social History   Substance Use Topics   ??? Smoking status: Current Every Day Smoker     Packs/day: 0.50     Types: Cigarettes   ??? Smokeless tobacco: Never Used   ??? Alcohol use No  Ready to quit: Not Answered  Counseling given: Not Answered      Vital Signs (Current):   Vitals:    07/09/16 0845   BP: 125/72   Pulse: 76   Resp: 16   Temp: 98.2 ??F (36.8 ??C)   TempSrc: Temporal   SpO2: 98%   Weight: 211 lb (95.7 kg)   Height: 6\' 1"  (1.854 m)                                              BP Readings from Last 3 Encounters:   07/09/16 125/72   07/02/16 125/77   07/10/15 128/75       NPO Status:  greater than 8 hours                                                                               BMI:   Wt Readings from Last 3 Encounters:   07/09/16 211 lb (95.7 kg)   07/02/16 212 lb (96.2 kg)   07/09/15 235 lb (106.6 kg)     Body mass index is 27.84 kg/m??.    CBC:   Lab Results   Component Value Date    WBC 6.7 06/27/2016    RBC 5.83 06/27/2016    RBC 5.17 05/31/2016    HGB 16.3 06/27/2016    HCT 48.6 06/27/2016    MCV 83.4 06/27/2016    RDW 15.3 06/27/2016    PLT 161 06/27/2016       CMP:   Lab Results   Component Value Date    NA 139 06/27/2016    K 4.2 06/27/2016    CL 101 06/27/2016    CO2 24 06/27/2016    BUN 15 06/27/2016    CREATININE 0.8 06/27/2016    GFRAA >60 06/27/2016    AGRATIO 0.9 05/31/2016    LABGLOM >60 06/27/2016    GLUCOSE 102 05/31/2016    PROT 8.3 06/27/2016    CALCIUM 9.2 06/27/2016    BILITOT 0.6 06/27/2016    ALKPHOS 165 06/27/2016    AST 50 06/27/2016    ALT 42 06/27/2016       POC Tests: No results for input(s): POCGLU, POCNA, POCK, POCCL, POCBUN, POCHEMO, POCHCT in the last 72  hours.    Coags: No results found for: PROTIME, INR, APTT    HCG (If Applicable): No results found for: PREGTESTUR, PREGSERUM, HCG, HCGQUANT     ABGs: No results found for: PHART, PO2ART, PCO2ART, HCO3ART, BEART, O2SATART     Type & Screen (If Applicable):  No results found for: LABABO, LABRH    Anesthesia Evaluation  Patient summary reviewed no history of anesthetic complications:   Airway: Mallampati: II  TM distance: >3 FB   Neck ROM: full  Mouth opening: > = 3 FB Dental: normal exam         Pulmonary:   (+) decreased breath sounds,  current smoker          Patient did not smoke on day of surgery.  Cardiovascular:            Rhythm: regular             Beta Blocker:  Not on Beta Blocker         Neuro/Psych:   Negative Neuro/Psych ROS              GI/Hepatic/Renal:   (+) hepatitis: C and B, liver disease (cirrhosis):,           Endo/Other:                     Abdominal:           Vascular: negative vascular ROS.                                     Anesthesia Plan      general     ASA 3       Induction: intravenous.    MIPS: Postoperative opioids intended and Prophylactic antiemetics administered.  Anesthetic plan and risks discussed with patient.      Plan discussed with CRNA.            Elford Evilsizer A Roby Spalla, DO   07/09/2016

## 2016-07-09 NOTE — Op Note (Signed)
[facility]                     [address] [place] [address]                                 OPERATIVE REPORT    PATIENT NAME: Travis Ortega, Travis Ortega                      DOB:        1970/06/23  MED REC NO:   [id number]                            ROOM:  ACCOUNT NO:   [id number]                           ADMIT DATE: 07/09/2016  PROVIDER:     Alphonsus Sias, MD    DATE OF PROCEDURE:  07/09/2016    PREOPERATIVE DIAGNOSIS:  Recurrent strangulated incisional hernia.    POSTOPERATIVE DIAGNOSIS:  Recurrent strangulated incisional hernia with  intraabdominal ascites and liver disease.    PROCEDURE PERFORMED:  Recurrent strangulated incisional hernia repair with  mesh, abdominal fluid sample, Tru-Cut core needle biopsy of liver, and  explantation of mesh.    SURGEON:  Alphonsus Sias, MD    ASSISTANT:  Dr. Wynell Balloon    ANESTHESIA:  General.    COMPLICATIONS:  None.    FLUIDS:  Crystalloid.    BLOOD LOSS:  20 mL.    DISPOSITION:  To be discharged home.    SPECIMENS:  Hernia sac and contents, old mesh, intraabdominal fluid, and  liver core biopsy.    INDICATIONS:  This is a 46 year old male with the aforementioned diagnosis.  I explained the risks, benefits, potential outcomes, and alternative  treatment to the aforementioned procedure and he agreed to proceed,  understanding those risks and potential outcomes of the procedure.    OPERATIVE PROCEDURE:  The patient was brought into the operative suite and  placed under general anesthesia, had bilateral PCDs placed, was given  preoperative antibiotics within 30 minutes of incision, was then prepped  and draped in a normal sterile condition.  Once this was done, local  anesthetic was infiltrated in the left upper quadrant of the abdomen.  A 5  mm incision was made.  A Veress needle was passed in the peritoneum and CO2  was used to insufflate the abdomen to a pressure of 15 mmHg.  At this time,  the Veress needle was removed and a 5-mm trocar  was put into place.   Additional 5-mm trocar was placed in the left lateral aspect of the abdomen  with a large amount of intraabdominal adhesions that were taken down to  expose the strangulated fat in the recurrent incisional hernia.  We also  noticed a large amount of intraabdominal ascites and suctioned out over 2  liters of this.  I sent some fluid off after cytology.  I also did a core  needle biopsy of the left lobe of the liver using the Tru-Cut core needle  biopsy kit.  Once this was done, hemostasis was obtained with  electrocautery.    We made a small 4-cm incision over the hernia defect.  There was a large  hernia sac that was dissected free and  removed from the fascia.  A Ventrio  ST 4.5-inch mesh was introduced through the hernia defect that was  approximately 4 cm in size.  Once this was done, it was closed over the  mesh with #1-PDS suture.  The mesh was then pulled up against the abdominal  wall and pneumoperitoneum was reestablished.  The mesh was intact in the  anterior abdominal wall circumferentially every 1 cm with the SecureStrap.   Several additional tacks were placed in the middle of the mesh to hold it  in place and had good several centimeter coverage over all aspects of the  hernia defect.  The remainder of the abdomen appeared normal.  We then  removed all trocars and evacuated the  pneumoperitoneum.  The large  subcutaneous cavity, where the hernia sac was, filled with Arista and  Vicryl sutures were done to approximate that skin flap down to the  abdominal wall to minimize that potential space.  The skin was then closed  with 3-0 Vicryl sutures in an interrupted subcuticular fashion and the  trocar sites were closed with 4-0 Monocryl in a subcuticular fashion.  The  Dermabond was then placed.  The patient was then woken up in a stable  condition.        Alphonsus Sias, MD    D: 07/09/2016 11:33:44       T: 07/09/2016 13:02:49     [email or web address]  Job#: [id number]     Doc#:  [id number]    CC:

## 2016-07-09 NOTE — Anesthesia Post-Procedure Evaluation (Signed)
Department of Anesthesiology  Postprocedure Note    Patient: Travis Ortega  MRN: [id number]  Birthdate: 07-10-1970  Date of evaluation: 07/09/2016  Time:  2:12 PM     Procedure Summary     Date:  07/09/16 Room / Location:  [facility] OR 03 / [facility]    Anesthesia Start:  1036 Anesthesia Stop:  1142    Procedure:  LAPAROSCOPIC INCISIONAL HERNIA REPAIR WITH MESH. POSS OPEN. (N/A Abdomen) Diagnosis:  (INCISIONAL HERNIA )    Surgeon:  Loreli SlotJoseph A Martin, MD Responsible Provider:  Veronda PrudeJacqueline Finn Altemose, DO    Anesthesia Type:  general ASA Status:  3          Anesthesia Type: No value filed.    Aldrete Phase I: Aldrete Score: 9    Aldrete Phase II: Aldrete Score: 10    Last vitals: Reviewed and per EMR flowsheets.       Anesthesia Post Evaluation    Patient location during evaluation: PACU  Patient participation: complete - patient participated  Level of consciousness: awake and alert  Pain score: 4  Airway patency: patent  Nausea & Vomiting: no nausea and no vomiting  Complications: no  Cardiovascular status: hemodynamically stable  Respiratory status: acceptable  Hydration status: euvolemic

## 2016-07-09 NOTE — Op Note (Signed)
[id number]

## 2016-07-10 LAB — GRAM STAIN

## 2016-07-11 LAB — CULTURE, BODY FLUID

## 2016-07-11 NOTE — Telephone Encounter (Signed)
Called pt to schedule 2 week post op appt after Lap incisional hernia repair on 3.26.18.  Left voicemail message to call back.    Rochele Pages, MA

## 2016-07-18 ENCOUNTER — Ambulatory Visit: Payer: PRIVATE HEALTH INSURANCE | Primary: Family

## 2016-07-23 ENCOUNTER — Encounter: Attending: Surgery | Primary: Family

## 2016-08-13 LAB — CULTURE, FUNGUS
Fungus (Mycology) Culture: NO GROWTH
Fungus Stain: NONE SEEN

## 2016-08-28 LAB — CULTURE WITH SMEAR, ACID FAST BACILLIUS: AFB Culture (Mycobacteria): NO GROWTH

## 2018-05-25 LAB — BASIC METABOLIC PANEL
Anion Gap: 13 mEq/L (ref 9–15)
BUN: 29 mg/dL — ABNORMAL HIGH (ref 6–20)
CO2: 22 mEq/L (ref 20–31)
Calcium: 8.6 mg/dL (ref 8.5–9.9)
Chloride: 91 mEq/L — ABNORMAL LOW (ref 95–107)
Creatinine: 1.1 mg/dL (ref 0.70–1.20)
GFR African American: >60 (ref >60)
GFR Non-African American: >60 (ref >60)
Glucose: 91 mg/dL (ref 70–99)
Potassium: 4.6 mEq/L (ref 3.4–4.9)
Sodium: 126 mEq/L — ABNORMAL LOW (ref 135–144)

## 2018-05-25 LAB — CBC
Hematocrit: 30.6 % — ABNORMAL LOW (ref 42.0–52.0)
Hemoglobin: 9.5 g/dL — ABNORMAL LOW (ref 14.0–18.0)
MCH: 22.6 pg — ABNORMAL LOW (ref 27.0–31.3)
MCHC: 31.1 % — ABNORMAL LOW (ref 33.0–37.0)
MCV: 72.8 fL — ABNORMAL LOW (ref 80.0–100.0)
Platelets: 228 10*3/uL (ref 130–400)
RBC: 4.2 M/uL — ABNORMAL LOW (ref 4.70–6.10)
RDW: 24.3 % — ABNORMAL HIGH (ref 11.5–14.5)
WBC: 4.2 10*3/uL — ABNORMAL LOW (ref 4.8–10.8)

## 2018-05-27 ENCOUNTER — Ambulatory Visit: Admit: 2018-05-27 | Payer: PRIVATE HEALTH INSURANCE | Primary: Family

## 2018-05-27 ENCOUNTER — Ambulatory Visit
Admit: 2018-05-27 | Discharge: 2018-05-27 | Payer: PRIVATE HEALTH INSURANCE | Attending: Nurse Practitioner | Primary: Family

## 2018-05-27 ENCOUNTER — Encounter

## 2018-05-27 DIAGNOSIS — K7031 Alcoholic cirrhosis of liver with ascites: Secondary | ICD-10-CM

## 2018-05-27 DIAGNOSIS — R0602 Shortness of breath: Secondary | ICD-10-CM

## 2018-05-27 NOTE — Progress Notes (Signed)
Travis Ortega, a male of 48 y.o. came to the office 05/27/2018.     Chief Complaint   Patient presents with   . Bloated     Pt c/o abd ascites. Pt states he also has swelling in his testicles.       HPI: Travis Ortega presents to clinic for consultation at the request of nursing home internist Dr. Durwin Reges for evaluation of ascites and paracentesis. Alcoholic cirrhosis with ascites. Last ETOH use 2002. HEP C. H/O drug abuse.   Symptoms include abdominal distention with discomfort. Short of breath. Both worsening since his last paracentesis on 05/19/2018 of 12.5 L per chart review. States he has been getting paracentesis for past two years at [facility] and used to follow with Dr. Everitt Amber.  Need for paracentesis has become more frequent in last few months averaging almost 1-2/week.  Scrotal edema. Per report from SNF on [place] of scotum for scrotal edema, incidentally found large amount of ascites.   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   States he is to follow with [facility].       Family History   Problem Relation Age of Onset   . Cancer Mother        Past Surgical History:   Procedure Laterality Date   . HERNIA REPAIR     . HERNIA REPAIR  07/09/2016   . KNEE SURGERY Right     arthroscopy    . PR LAP, INCISIONAL HERNIA REPAIR,REDUCIBLE N/A 07/09/2016    LAPAROSCOPIC INCISIONAL HERNIA REPAIR WITH MESH. POSS OPEN. performed by Loreli Slot, MD at [facility] OR        Past Medical History:   Diagnosis Date   . Cirrhosis (HCC)    . Hepatitis B    . Hepatitis C        Social History     Socioeconomic History   . Marital status: Single     Spouse name: None   . Number of children: None   . Years of education: None   . Highest education level: None   Occupational History   . None   Social Needs   . Financial resource strain: None   . Food insecurity:     Worry: None     Inability: None   . Transportation needs:     Medical: None     Non-medical: None   Tobacco Use   . Smoking  status: Current Every Day Smoker     Packs/day: 0.50     Types: Cigarettes   . Smokeless tobacco: Never Used   Substance and Sexual Activity   . Alcohol use: No   . Drug use: No   . Sexual activity: None   Lifestyle   . Physical activity:     Days per week: None     Minutes per session: None   . Stress: None   Relationships   . Social connections:     Talks on phone: None     Gets together: None     Attends religious service: None     Active member of club or organization: None     Attends meetings of clubs or organizations: None     Relationship status: None   . Intimate partner violence:     Fear of current or ex partner: None     Emotionally abused: None     Physically abused: None  Forced sexual activity: None   Other Topics Concern   . None   Social History Narrative   . None       Allergies   Allergen Reactions   . Morphine Swelling   . Penicillin G Swelling     Throat swells   . Tylenol [Acetaminophen] Rash       Current Outpatient Medications on File Prior to Visit   Medication Sig Dispense Refill   . ipratropium-albuterol (DUONEB) 0.5-2.5 (3) MG/3ML SOLN nebulizer solution Inhale 1 vial into the lungs every 6 hours as needed for Shortness of Breath     . melatonin 3 MG TABS tablet Take 6 mg by mouth daily     . omeprazole (PRILOSEC) 20 MG delayed release capsule Take 20 mg by mouth daily     . oxyCODONE (ROXICODONE) 5 MG immediate release tablet Take 10 mg by mouth 3 times daily.     Marland Kitchen spironolactone (ALDACTONE) 100 MG tablet Take 100 mg by mouth daily     . ondansetron (ZOFRAN) 4 MG tablet Take 4 mg by mouth Daily      . furosemide (LASIX) 20 MG tablet Take 40 mg by mouth three times a week      . ibuprofen (ADVIL;MOTRIN) 800 MG tablet Take 1 tablet by mouth every 6 hours as needed for Pain (Patient not taking: Reported on 05/27/2018) 20 tablet 0   . triamcinolone (KENALOG) 0.025 % cream APP EXT AA BID  0   . polyethylene glycol (GLYCOLAX) powder take 17GM (DISSOLVED IN WATER) by mouth once daily  0   .  baclofen (LIORESAL) 10 MG tablet Take 10 mg by mouth twice a week      . tenofovir (VIREAD) 300 MG tablet TK 1 T PO Daily  0     No current facility-administered medications on file prior to visit.        Review of Systems   Constitutional: Positive for fever. Negative for appetite change (poor ), chills and fatigue.   HENT: Positive for postnasal drip.    Eyes: Negative.    Respiratory: Positive for cough, shortness of breath and wheezing.    Cardiovascular: Positive for leg swelling (both lower legs). Negative for chest pain and palpitations.   Gastrointestinal: Positive for abdominal distention, abdominal pain, diarrhea, nausea and vomiting. Negative for constipation.   Endocrine: Negative.    Genitourinary: Positive for scrotal swelling. Negative for dysuria, frequency and hematuria.   Musculoskeletal: Negative for back pain, neck pain and neck stiffness.   Skin: Negative for rash and wound.   Neurological: Positive for dizziness and light-headedness. Negative for headaches.   Hematological: Does not bruise/bleed easily.   Psychiatric/Behavioral: The patient is nervous/anxious.        OBJECTIVE:  BP (!) 99/57   Pulse 76   Resp 14   Ht 6\' 1"  (1.854 m)   Wt 220 lb (99.8 kg)   SpO2 99%   BMI 29.03 kg/m     Physical Exam  Constitutional:       Appearance: Normal appearance.   HENT:      Head: Normocephalic and atraumatic.      Nose: Nose normal. No congestion.      Mouth/Throat:      Mouth: Mucous membranes are moist.      Pharynx: Oropharynx is clear. No oropharyngeal exudate.   Neck:      Musculoskeletal: Normal range of motion and neck supple.   Cardiovascular:      Rate  and Rhythm: Normal rate and regular rhythm.      Heart sounds: Normal heart sounds.   Pulmonary:      Effort: Pulmonary effort is normal. No tachypnea, accessory muscle usage or respiratory distress.      Breath sounds: Examination of the right-upper field reveals wheezing. Examination of the left-upper field reveals wheezing. Examination  of the right-middle field reveals wheezing. Examination of the right-lower field reveals decreased breath sounds, wheezing and rales. Examination of the left-lower field reveals decreased breath sounds, wheezing and rales. Decreased breath sounds, wheezing and rales present.   Abdominal:      General: There is distension.      Tenderness: There is abdominal tenderness.   Genitourinary:     Scrotum/Testes:         Right: Swelling present.         Left: Swelling present.   Musculoskeletal: Normal range of motion.      Right lower leg: 1+ Pitting Edema present.      Left lower leg: 1+ Pitting Edema present.   Skin:     General: Skin is warm and dry.      Capillary Refill: Capillary refill takes 2 to 3 seconds.      Coloration: Skin is not jaundiced.   Neurological:      Mental Status: He is alert and oriented to person, place, and time.   Psychiatric:         Behavior: Behavior is withdrawn.         Cognition and Memory: Cognition and memory normal.       ASSESSMENT AND PLAN:    Assessment:   1. Alcoholic cirrhosis of Liver with ascites.   2. Short of breath: [place] Chest Mediastinal to evaluate for bilateral pleural effusions. Difficulty viewing adequately due to large amount of ascites, however it appears as though there could be possible effusions.     Plan:   1. Paracentesis tomorrow. Discussed procedure with risks to patient. He wishes to proceed.   2. Following paracentesis, will do follow up US Chest Mediastinum to evaluate for effusions. If significant effusion, will perform thoracentesis.   3. External referral to [facility] Dr. Cheri Fowler for TIPS evaluation. This was discussed with patient and he is requesting. k    Thank You Dr. Durwin Reges for referral of your patient to our practice for care.     Wilma Wuthrich Lyn Delvon Chipps, APRN - CNP

## 2018-05-28 ENCOUNTER — Inpatient Hospital Stay
Admit: 2018-05-28 | Discharge: 2018-06-02 | Payer: PRIVATE HEALTH INSURANCE | Attending: Vascular & Interventional Radiology | Primary: Family

## 2018-05-28 DIAGNOSIS — K7031 Alcoholic cirrhosis of liver with ascites: Secondary | ICD-10-CM

## 2018-05-28 LAB — CELL COUNT WITH DIFFERENTIAL, BODY FLUID
Basos, Fluid: 2 %
Eos, Fluid: 3 %
Lymphocytes, Body Fluid: 40 %
Mesothelial, Fluid: 7 %
Monocyte Count, Fluid: 20 %
Neutrophil Count, Fluid: 35 %
Nucl Cell, Fluid: 62 /mm3
Number of Cells Counted Fluid: 100
RBC, Fluid: 102 /mm3

## 2018-05-28 LAB — LACTATE DEHYDROGENASE, BODY FLUID: LD, Fluid: 39 U/L

## 2018-05-28 LAB — ALBUMIN, FLUID: Albumin, Fluid: <0.2 g/dL

## 2018-05-28 LAB — GLUCOSE, BODY FLUID: Glucose, Fluid: 121 mg/dL

## 2018-05-28 LAB — PROTEIN, BODY FLUID: Protein, Fluid: 0.6 g/dL

## 2018-05-28 LAB — GRAM STAIN

## 2018-05-28 MED ORDER — ALBUMIN HUMAN 25 % IV SOLN
25 % | Freq: Once | INTRAVENOUS | Status: AC | PRN
Start: 2018-05-28 — End: 2018-05-28
  Administered 2018-05-28: 19:00:00 50 g via INTRAVENOUS

## 2018-05-28 MED ORDER — ALBUMIN HUMAN 25 % IV SOLN
25 | INTRAVENOUS | Status: AC
Start: 2018-05-28 — End: 2018-05-28

## 2018-05-28 MED ORDER — LIDOCAINE HCL 2 % IJ SOLN
2 % | Freq: Once | INTRAMUSCULAR | Status: AC | PRN
Start: 2018-05-28 — End: 2018-05-28
  Administered 2018-05-28: 18:00:00 10 via INTRADERMAL

## 2018-05-28 MED FILL — ALBUTEIN 25 % IV SOLN: 25 % | INTRAVENOUS | Qty: 200

## 2018-05-28 NOTE — Progress Notes (Addendum)
Pt in CT HR. Pain has diminished.   Eating fruit and sipping pop. LLQ abdomen site band aid intact.     1415 attempting to obtain IV access for albumin.     1425 albumin infusing without difficulty.     1445 pt denies complaints.    1505 albumin complete.   IV removed.    Vitals stable. LLQ band aid intact.  D/c instructions given, pt verbalized understanding.    Pt aware of external referral to [facility] for TIPS evaluation.     1510 pt assisted off cart into w/c.     1525 assisted pt into [facility] with staff, no changes noted. Report called to per Joneen Boers.

## 2018-05-28 NOTE — Progress Notes (Signed)
1521 Report given to [facility] nurse at [facility].

## 2018-05-28 NOTE — Progress Notes (Addendum)
Pt in lobby with staff from [facility]. update received. Pt is alert and oriented.   Pt assisted to cart from w/c. Pt complaints of right side swelling, difficulty to move whole right side.   Pt states he is in pain "7" scrotum and abdomen. Pt was mediated for pain this am at [facility].  Procedure explained and consent signed. Pt denies any SOB at this time.

## 2018-05-28 NOTE — Other (Addendum)
NO SEDATION    1220 Pt arrived to U/S room 3 via w/c. Jenna  U/S tech obtaining images using U/S. Pt offered reassurance and VSS. Pt denies pain at this time. Consent signed.     1232 Timeout completed.     1233 Using U/S, Dr. Floyde Parkins marked LLQ and area cleansed with large tinted chloraprep. Once dry, using sterile technique, Dr. Floyde Parkins prepped and draped area.     1234 Using U/S guidance, Dr. Floyde Parkins numbed site with 2% lidocaine.     1236 Using U/S guidance, access obtained with One1step centesis 58F catheter with return of pale yellow, very turbid fluid. Fluid removed for lab specimen. Catheter tubing connected to neptune suction and draining well. Pt tolerating well.    1250 4 L removed so far, pt tolerating very well. Specimens taken to the lab as ordered.    1315 pt tolerating very well, denies complaints. Pt eating a popsicle.     1340 Drainage complete. Total 11,200 ml removed. MJ RN, removed centesis catheter and digital pressure held to site for 2 minutes.     1345 Jenna [place] tech scanning pt posterior lungs as ordered per Dr Floyde Parkins.     1353  site soft, no drainage, large bandaid applied.  Pt taken to CT HR for IV albumin infusion.  No complaints voiced.

## 2018-05-28 NOTE — Discharge Instructions (Signed)
Ultrasound Guided Paracentesis    Activity:  Rest, avoid strenuous activity such as lifting heavy objects, and bending, stretching or pulling.     Diet:  Resume usual diet    Medication:  * Resume home medication unless otherwise instructed by your physician.  * (If Applicable) If taking any blood thinners or Aspirin, speak with your physician before restarting them.  * May take mild pain reliever, as needed, such as Acetaminophen or Ibuprofen.    INSTRUCTIONS OR COMMENTS RELATIVE TO SPECIFIC EXAM PERFORMED:    - Some tenderness at site of paracentesis will be normal for a few days.  - May remove band aid after 48 hours.   - Monitor the site for the next 24 - 48 hours.  - If you experience any drainage or bleeding at the site, hold pressure for 30 minutes and lay on side opposite of the procedure site (move fluid away from the   area of leakage).  If drainage or bleeding does not stop and is large amount, call your physician or go to the ER for evaluation.   - If any signs of infection (redness, swelling, drainage/pus) at the site, go to ER for evaluation.     IF YOU DEVELOP ANY OF THE FOLLOWING SYMPTOMS, CONTACT PHYSICIAN LISTED BELOW:  Physician performing procedure: DR. Wehbe - [phone] or [phone] and ask to be put in contact with the RADIOLOGY RN. After hours contact ER.  * Extreme pain that increases after leaving the hospital  * Active bleeding (refer to above instructions)  * Chills, fever above 100 degrees Farenheit and fatigue.  * Weakness, dizziness, lightheadedness or fainting.    If you are unable to contact any of the above physician and you feel you have a major problem, please go to the Emergency Room for treatment.    TOTAL REMOVED WITH PARACENTESIS TODAY: ***

## 2018-05-28 NOTE — Progress Notes (Signed)
Report received from [facility] staff.   States pt is stable. Alert and oriented. Ate breakfast.   Not on blood thinners.   Will arrive for paracentesis around 1130 today.

## 2018-05-29 NOTE — Progress Notes (Signed)
05/29/2018 950 AM spoke to nurse Lawson Fiscal at [facility] for follow up call s/p paracentesis.  Patient doing well.   VSS.  Encouraged daily weights to be done.  Staff aware to contact Dr Cathlean Marse[place] office to schedule his next paracentesis appointment.

## 2018-05-31 LAB — CULTURE, BODY FLUID (WITH GRAM STAIN): Body Fluid Culture, Sterile: NO GROWTH

## 2018-06-02 ENCOUNTER — Telehealth

## 2018-06-02 NOTE — Telephone Encounter (Signed)
Order placed. Please schedule.

## 2018-06-02 NOTE — Telephone Encounter (Signed)
Pt called needing an appointment for a paracentesis. Pt had 11L removed 05/28/2018. Pt is currently at [facility] [phone]. Please advise if ok to schedule appointment.

## 2018-06-02 NOTE — Telephone Encounter (Signed)
Spoke with [facility] transportation. Made aware of appt date and time. Pt will be here at 12:30. No other questions

## 2018-06-04 ENCOUNTER — Encounter

## 2018-06-04 ENCOUNTER — Inpatient Hospital Stay: Admit: 2018-06-04 | Attending: Vascular & Interventional Radiology | Primary: Family

## 2018-06-04 DIAGNOSIS — K7031 Alcoholic cirrhosis of liver with ascites: Secondary | ICD-10-CM

## 2018-06-04 MED ORDER — ALBUMIN HUMAN 25 % IV SOLN
25 % | Freq: Once | INTRAVENOUS | Status: AC
Start: 2018-06-04 — End: 2018-06-04
  Administered 2018-06-04: 20:00:00 50 g via INTRAVENOUS

## 2018-06-04 MED ORDER — LIDOCAINE HCL 2 % IJ SOLN
2 % | Freq: Once | INTRAMUSCULAR | Status: AC | PRN
Start: 2018-06-04 — End: 2018-06-04
  Administered 2018-06-04: 19:00:00 6 via INTRADERMAL

## 2018-06-04 MED ORDER — ALBUMIN HUMAN 25 % IV SOLN
25 | INTRAVENOUS | Status: AC
Start: 2018-06-04 — End: 2018-06-04

## 2018-06-04 MED FILL — ALBUTEIN 25 % IV SOLN: 25 % | INTRAVENOUS | Qty: 200

## 2018-06-04 NOTE — Other (Addendum)
NO SEDATION      Pt arrived to U/S room 3 via wheelchair. Kim U/S tech obtaining images using U/S. Pt offered reassurance and VSS. Pt denies pain at this time. Consent obtained.     1345-Timeout completed.     1346-Using U/S, Dr. Floyde Parkins marked LLQ and area cleansed with large tinted chloraprep. Once dry, using sterile technique, Dr. Floyde Parkins prepped and draped area.     1347-Using U/S guidance, Dr. Floyde Parkins numbed site with 2% lidocaine, see eMar.     1348-Using U/S guidance, access obtained with One1step centesis 51F catheter with return of cloudy yellow fluid. Catheter tubing connected to suction canisters and draining well. Pt tolerating well.    1450- Drainage complete. Total removed. Misty U/S tech, removed centesis catheter and digital pressure held to site for 3 minutes.     1453- LLQ site soft, no drainage, large bandaid applied.

## 2018-06-04 NOTE — Discharge Instructions (Signed)
Ultrasound Guided Paracentesis    Activity:  Rest, avoid strenuous activity such as lifting heavy objects, and bending, stretching or pulling.     Diet:  Resume usual diet    Medication:  * Resume home medication unless otherwise instructed by your physician.  * (If Applicable) If taking any blood thinners or Aspirin, speak with your physician before restarting them.  * May take mild pain reliever, as needed, such as Acetaminophen or Ibuprofen.    INSTRUCTIONS OR COMMENTS RELATIVE TO SPECIFIC EXAM PERFORMED:    - Some tenderness at site of paracentesis will be normal for a few days.  - May remove band aid after 48 hours.   - Monitor the site for the next 24 - 48 hours.  - If you experience any drainage or bleeding at the site, hold pressure for 30 minutes and lay on side opposite of the procedure site (move fluid away from the   area of leakage).  If drainage or bleeding does not stop and is large amount, call your physician or go to the ER for evaluation.   - If any signs of infection (redness, swelling, drainage/pus) at the site, go to ER for evaluation.     IF YOU DEVELOP ANY OF THE FOLLOWING SYMPTOMS, CONTACT PHYSICIAN LISTED BELOW:  Physician performing procedure: DR. Floyde Parkins - [phone] or [phone] and ask to be put in contact with the RADIOLOGY RN. After hours contact ER.  * Extreme pain that increases after leaving the hospital  * Active bleeding (refer to above instructions)  * Chills, fever above 100 degrees Farenheit and fatigue.  * Weakness, dizziness, lightheadedness or fainting.    If you are unable to contact any of the above physician and you feel you have a major problem, please go to the Emergency Room for treatment.    TOTAL REMOVED WITH PARACENTESIS TODAY: 

## 2018-06-04 NOTE — Other (Signed)
1500-Patient brought back to CT holding room via cart. Albumin continues to infuse, patient tolerating well. Snacks and drink provided to patient per his request.    1515-Spoke with Joni via phone regarding referral to Dr. Cheri Fowler at [facility] and also for a referral for Dr. Arlean Hopping.     1525-Patient continues to tolerate Albumin well. Patient taken to bathroom via wheelchair to void.     1535-Patient sitting up on cart, eating snacks. Continues to tolerate Albumin well.     1540-Discharge instructions gone over with patient. Patient has no questions or concerns at this time. Call placed to [facility] from [facility] for transportation back in about 20 min once Albumin is completed.     1620-IV removed, dry dressing placed at site. Patient discharged via wheelchair to [facility] for transport back.

## 2018-06-04 NOTE — H&P (Signed)
Note from Karilyn Cota NP-C    HPI: Travis Ortega presents to clinic for consultation at the request of nursing home internist Dr. Durwin Reges for evaluation of ascites and paracentesis. Alcoholic cirrhosis with ascites. Last ETOH use 2002. HEP C. H/O drug abuse.   Symptoms include abdominal distention with discomfort. Short of breath. Both worsening since his last paracentesis on 05/19/2018 of 12.5 L per chart review. States he has been getting paracentesis for past two years at [facility] and used to follow with Dr. Everitt Amber.  Need for paracentesis has become more frequent in last few months averaging almost 1-2/week.  Scrotal edema. Per report from SNF on [place] of scotum for scrotal edema, incidentally found large amount of ascites.   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   States he is to follow with [facility].       Family History   Problem Relation Age of Onset   . Cancer Mother        Past Surgical History:   Procedure Laterality Date   . HERNIA REPAIR     . HERNIA REPAIR  07/09/2016   . KNEE SURGERY Right     arthroscopy    . PARACENTESIS Left 05/28/2018    total of 11,200 cc removed per Dr Floyde Parkins   . PARACENTESIS Left 06/04/2018    Total of per Dr. Floyde Parkins   . PR LAP, INCISIONAL HERNIA REPAIR,REDUCIBLE N/A 07/09/2016    LAPAROSCOPIC INCISIONAL HERNIA REPAIR WITH MESH. POSS OPEN. performed by Loreli Slot, MD at [facility] OR        Past Medical History:   Diagnosis Date   . Cirrhosis (HCC)    . Hepatitis B    . Hepatitis C        Social History     Socioeconomic History   . Marital status: Single     Spouse name: None   . Number of children: None   . Years of education: None   . Highest education level: None   Occupational History   . None   Social Needs   . Financial resource strain: None   . Food insecurity:     Worry: None     Inability: None   . Transportation needs:     Medical: None     Non-medical: None   Tobacco Use   . Smoking status: Current Every Day  Smoker     Packs/day: 0.50     Types: Cigarettes   . Smokeless tobacco: Never Used   Substance and Sexual Activity   . Alcohol use: No   . Drug use: No   . Sexual activity: None   Lifestyle   . Physical activity:     Days per week: None     Minutes per session: None   . Stress: None   Relationships   . Social connections:     Talks on phone: None     Gets together: None     Attends religious service: None     Active member of club or organization: None     Attends meetings of clubs or organizations: None     Relationship status: None   . Intimate partner violence:     Fear of current or ex partner: None     Emotionally abused: None     Physically abused: None     Forced sexual activity: None   Other  Topics Concern   . None   Social History Narrative   . None       Allergies   Allergen Reactions   . Nubain [Nalbuphine] Anaphylaxis and Hives   . Morphine Swelling   . Penicillin G Swelling     Throat swells   . Tylenol [Acetaminophen] Rash       Current Outpatient Medications on File Prior to Encounter   Medication Sig Dispense Refill   . 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 oxyCODONE (ROXICODONE) 5 MG immediate release tablet Take 10 mg by mouth 3 times daily.     Marland Kitchen spironolactone (ALDACTONE) 100 MG tablet Take 100 mg by mouth daily     . ibuprofen (ADVIL;MOTRIN) 800 MG tablet Take 1 tablet by mouth every 6 hours as needed for Pain 20 tablet 0   . polyethylene glycol (GLYCOLAX) powder take 17GM (DISSOLVED IN WATER) by mouth once daily  0   . ondansetron (ZOFRAN) 4 MG tablet Take 4 mg by mouth Daily      . tenofovir (VIREAD) 300 MG tablet TK 1 T PO Daily  0   . furosemide (LASIX) 20 MG tablet Take 40 mg by mouth three times a week      . ipratropium-albuterol (DUONEB) 0.5-2.5 (3) MG/3ML SOLN nebulizer solution Inhale 1 vial into the lungs every 6 hours as needed for Shortness of Breath     . melatonin 3 MG TABS tablet Take 6 mg by mouth  daily     . omeprazole (PRILOSEC) 20 MG delayed release capsule Take 20 mg by mouth daily     . triamcinolone (KENALOG) 0.025 % cream APP EXT AA BID  0   . baclofen (LIORESAL) 10 MG tablet Take 10 mg by mouth twice a week        No current facility-administered medications on file prior to encounter.        Review of Systems   Constitutional: Positive for fever. Negative for appetite change (poor ), chills and fatigue.   HENT: Positive for postnasal drip.    Eyes: Negative.    Respiratory: Positive for cough, shortness of breath and wheezing.    Cardiovascular: Positive for leg swelling (both lower legs). Negative for chest pain and palpitations.   Gastrointestinal: Positive for abdominal distention, abdominal pain, diarrhea, nausea and vomiting. Negative for constipation.   Endocrine: Negative.    Genitourinary: Positive for scrotal swelling. Negative for dysuria, frequency and hematuria.   Musculoskeletal: Negative for back pain, neck pain and neck stiffness.   Skin: Negative for rash and wound.   Neurological: Positive for dizziness and light-headedness. Negative for headaches.   Hematological: Does not bruise/bleed easily.   Psychiatric/Behavioral: The patient is nervous/anxious.        OBJECTIVE:  BP 108/64   Pulse 80   Resp 16   SpO2 100%     Physical Exam  Constitutional:       Appearance: Normal appearance.   HENT:      Head: Normocephalic and atraumatic.      Nose: Nose normal. No congestion.      Mouth/Throat:      Mouth: Mucous membranes are moist.      Pharynx: Oropharynx is clear. No oropharyngeal exudate.   Neck:      Musculoskeletal: Normal range of motion and neck supple.   Cardiovascular:      Rate and  Rhythm: Normal rate and regular rhythm.      Heart sounds: Normal heart sounds.   Pulmonary:      Effort: Pulmonary effort is normal. No tachypnea, accessory muscle usage or respiratory distress.      Breath sounds: Examination of the right-upper field reveals wheezing. Examination of the  left-upper field reveals wheezing. Examination of the right-middle field reveals wheezing. Examination of the right-lower field reveals decreased breath sounds, wheezing and rales. Examination of the left-lower field reveals decreased breath sounds, wheezing and rales. Decreased breath sounds, wheezing and rales present.   Abdominal:      General: There is distension.      Tenderness: There is abdominal tenderness.   Genitourinary:     Scrotum/Testes:         Right: Swelling present.         Left: Swelling present.   Musculoskeletal: Normal range of motion.      Right lower leg: 1+ Pitting Edema present.      Left lower leg: 1+ Pitting Edema present.   Skin:     General: Skin is warm and dry.      Capillary Refill: Capillary refill takes 2 to 3 seconds.      Coloration: Skin is not jaundiced.   Neurological:      Mental Status: He is alert and oriented to person, place, and time.   Psychiatric:         Behavior: Behavior is withdrawn.         Cognition and Memory: Cognition and memory normal.       ASSESSMENT AND PLAN:    Assessment:   1. Alcoholic cirrhosis of Liver with ascites.     Plan:   1. Paracentesis    2. External referral to [facility] Dr. Cheri Fowler for TIPS evaluation. This was discussed with patient and he is requesting. k    Thank You Dr. Durwin Reges for referral of your patient to our practice for care.     Windy Fast, MD

## 2018-06-04 NOTE — Progress Notes (Addendum)
1315- Patient arrived to [place], assisted on to cart. Initial images obtained by Selena Batten, [place] tech. Patient states that he is having gradually worsening pain in his right arm, that radiates down into his abdomen, to his scrotum, and down his right leg. Vital signs taken and recorded. History and home medications reviewed.  No changes since last para. When reviewing allergies, patient stated that he does have an allergy to Nubain, reaction is anaphylaxis and hives. Nubain added to allergy list.

## 2018-06-06 NOTE — Telephone Encounter (Signed)
[facility] called stating pt is supposed to have a paracentesis 2x per week. Please advise if this should be scheduled and place orders. I advised Nyeshia with [facility] I will call her back on Monday once this has been ordered.

## 2018-06-07 ENCOUNTER — Inpatient Hospital Stay
Admit: 2018-06-07 | Discharge: 2018-06-17 | Disposition: A | Discharge status: Skilled Nursing Facility | Payer: MEDICARE | Source: Ambulatory Visit | Admitting: Internal Medicine

## 2018-06-07 ENCOUNTER — Emergency Department: Admit: 2018-06-07 | Payer: MEDICARE | Primary: Family

## 2018-06-07 DIAGNOSIS — M4622 Osteomyelitis of vertebra, cervical region: Principal | ICD-10-CM

## 2018-06-07 LAB — BASIC METABOLIC PANEL
Anion Gap: 10 mEq/L (ref 9–15)
BUN: 18 mg/dL (ref 6–20)
CO2: 21 mEq/L (ref 20–31)
Calcium: 8.4 mg/dL — ABNORMAL LOW (ref 8.5–9.9)
Chloride: 91 mEq/L — ABNORMAL LOW (ref 95–107)
Creatinine: 1.07 mg/dL (ref 0.70–1.20)
GFR African American: >60 (ref >60)
GFR Non-African American: >60 (ref >60)
Glucose: 136 mg/dL — ABNORMAL HIGH (ref 70–99)
Potassium: 4.3 mEq/L (ref 3.4–4.9)
Sodium: 122 mEq/L — ABNORMAL LOW (ref 135–144)

## 2018-06-07 LAB — CBC WITH AUTO DIFFERENTIAL
Bands Relative: 3 %
Basophils %: 0.3 %
Basophils Absolute: 0 10*3/uL (ref 0.0–0.2)
Eosinophils %: 0.4 %
Eosinophils Absolute: 0 10*3/uL (ref 0.0–0.7)
Hematocrit: 23.7 % — ABNORMAL LOW (ref 42.0–52.0)
Hemoglobin: 7.4 g/dL — ABNORMAL LOW (ref 14.0–18.0)
Lymphocytes %: 13 %
Lymphocytes Absolute: 0.7 10*3/uL — ABNORMAL LOW (ref 1.0–4.8)
MCH: 21.9 pg — ABNORMAL LOW (ref 27.0–31.3)
MCHC: 31.4 % — ABNORMAL LOW (ref 33.0–37.0)
MCV: 70 fL — ABNORMAL LOW (ref 80.0–100.0)
Metamyelocytes Relative: 3 %
Monocytes %: 8.4 %
Monocytes Absolute: 0.4 10*3/uL (ref 0.2–0.8)
Neutrophils %: 73 %
Neutrophils Absolute: 4.3 10*3/uL (ref 1.4–6.5)
PLATELET SLIDE REVIEW: DECREASED
Platelets: 123 10*3/uL — ABNORMAL LOW (ref 130–400)
RBC: 3.38 M/uL — ABNORMAL LOW (ref 4.70–6.10)
RDW: 23.8 % — ABNORMAL HIGH (ref 11.5–14.5)
WBC: 5.5 10*3/uL (ref 4.8–10.8)

## 2018-06-07 LAB — COMPREHENSIVE METABOLIC PANEL
ALT: 23 U/L (ref 0–41)
AST: 54 U/L — ABNORMAL HIGH (ref 0–40)
Albumin: 2.7 g/dL — ABNORMAL LOW (ref 3.5–4.6)
Alkaline Phosphatase: 160 U/L — ABNORMAL HIGH (ref 35–104)
Anion Gap: 10 mEq/L (ref 9–15)
BUN: 21 mg/dL — ABNORMAL HIGH (ref 6–20)
CO2: 19 mEq/L — ABNORMAL LOW (ref 20–31)
Calcium: 8 mg/dL — ABNORMAL LOW (ref 8.5–9.9)
Chloride: 91 mEq/L — ABNORMAL LOW (ref 95–107)
Creatinine: 1 mg/dL (ref 0.70–1.20)
GFR African American: >60 (ref >60)
GFR Non-African American: >60 (ref >60)
Globulin: 3.9 g/dL — ABNORMAL HIGH (ref 2.3–3.5)
Glucose: 108 mg/dL — ABNORMAL HIGH (ref 70–99)
Potassium: 5.2 mEq/L — ABNORMAL HIGH (ref 3.4–4.9)
Sodium: 120 mEq/L — CL (ref 135–144)
Total Bilirubin: 0.9 mg/dL — ABNORMAL HIGH (ref 0.2–0.7)
Total Protein: 6.6 g/dL (ref 6.3–8.0)

## 2018-06-07 LAB — RAPID INFLUENZA A/B ANTIGENS
Influenza A by PCR: NEGATIVE
Influenza B by PCR: NEGATIVE

## 2018-06-07 LAB — LIPASE: Lipase: 44 U/L (ref 12–95)

## 2018-06-07 LAB — MAGNESIUM: Magnesium: 1.9 mg/dL (ref 1.7–2.4)

## 2018-06-07 LAB — PROTIME-INR
INR: 1.3
Protime: 16.6 s — ABNORMAL HIGH (ref 12.3–14.9)

## 2018-06-07 LAB — LACTIC ACID: Lactic Acid: 1 mmol/L (ref 0.5–2.2)

## 2018-06-07 LAB — APTT: aPTT: 37.8 s — ABNORMAL HIGH (ref 24.4–36.8)

## 2018-06-07 MED ORDER — IOPAMIDOL 76 % IV SOLN
76 % | Freq: Once | INTRAVENOUS | Status: AC | PRN
Start: 2018-06-07 — End: 2018-06-07
  Administered 2018-06-07: 23:00:00 100 mL via INTRAVENOUS

## 2018-06-07 MED ORDER — SODIUM CHLORIDE 0.9 % IV SOLN
0.9 % | INTRAVENOUS | Status: AC
Start: 2018-06-07 — End: 2018-06-07
  Administered 2018-06-07: 23:00:00 500 via INTRAVENOUS

## 2018-06-07 MED ORDER — HYDROMORPHONE HCL 1 MG/ML IJ SOLN
1 MG/ML | INTRAMUSCULAR | Status: AC | PRN
Start: 2018-06-07 — End: 2018-06-07
  Administered 2018-06-07 (×2): 1 mg via INTRAVENOUS

## 2018-06-07 MED ORDER — LIDOCAINE HCL (PF) 1 % IJ SOLN
1 % | Freq: Once | INTRAMUSCULAR | Status: DC
Start: 2018-06-07 — End: 2018-06-07

## 2018-06-07 MED ORDER — ONDANSETRON HCL 4 MG/2ML IJ SOLN
4 MG/2ML | Freq: Once | INTRAMUSCULAR | Status: AC
Start: 2018-06-07 — End: 2018-06-07
  Administered 2018-06-07: 22:00:00 4 mg via INTRAVENOUS

## 2018-06-07 MED ORDER — SODIUM CHLORIDE 0.9 % IV BOLUS
0.9 % | Freq: Once | INTRAVENOUS | Status: AC
Start: 2018-06-07 — End: 2018-06-07

## 2018-06-07 MED ORDER — KETOROLAC TROMETHAMINE 30 MG/ML IJ SOLN
30 | Freq: Once | INTRAMUSCULAR | Status: AC
Start: 2018-06-07 — End: 2018-06-07
  Administered 2018-06-07: 22:00:00 30 mg via INTRAVENOUS

## 2018-06-07 MED ORDER — LIDOCAINE-EPINEPHRINE 1 %-1:100000 IJ SOLN
1 %-:00000 | Freq: Once | INTRAMUSCULAR | Status: AC
Start: 2018-06-07 — End: 2018-06-07
  Administered 2018-06-07: 20 mL via INTRADERMAL

## 2018-06-07 MED ORDER — DEXTROSE 5 % IV SOLN (MINI-BAG)
5 % | Freq: Once | INTRAVENOUS | Status: DC
Start: 2018-06-07 — End: 2018-06-07

## 2018-06-07 MED FILL — HYDROMORPHONE HCL 1 MG/ML IJ SOLN: 1 mg/mL | INTRAMUSCULAR | Qty: 1

## 2018-06-07 MED FILL — XYLOCAINE/EPINEPHRINE 1 %-1:100000 IJ SOLN: 1 %-:00000 | INTRAMUSCULAR | Qty: 20

## 2018-06-07 MED FILL — ONDANSETRON HCL 4 MG/2ML IJ SOLN: 4 MG/2ML | INTRAMUSCULAR | Qty: 2

## 2018-06-07 MED FILL — KETOROLAC TROMETHAMINE 30 MG/ML IJ SOLN: 30 mg/mL | INTRAMUSCULAR | Qty: 1

## 2018-06-07 MED FILL — SODIUM CHLORIDE 0.9 % IV SOLN: 0.9 % | INTRAVENOUS | Qty: 500

## 2018-06-07 MED FILL — CEFTRIAXONE SODIUM 1 G IJ SOLR: 1 g | INTRAMUSCULAR | Qty: 1

## 2018-06-07 NOTE — Progress Notes (Signed)
Pt home meds reconciled  Pt scrotum very edematous , pt complaint of new onset nuchal rigidity and pain that started yesterday evening  Dr Susa Raring notified  Pt afebrile currently

## 2018-06-07 NOTE — ED Notes (Signed)
Sent body fluid culture      Gillermina Hu, RN  06/07/18 [phone]

## 2018-06-07 NOTE — ED Triage Notes (Signed)
Pt from [facility] with groin, back, neck, and abd pain.  Pt is A&Ox4, groin +3edema noted on rt side with 10/10 pain.  Pt is febrile, breaths are equal and unlabored. Pt states pain and vomiting started last night at 9pm.  Pt states hesitation at urination but is urinating normal.  Pt had abd drained on 04 Jun 2018

## 2018-06-07 NOTE — ED Notes (Signed)
Creatine was unable to be obtained through bedside tube. Notified CT and provider      Gillermina Hu, RN  06/07/18 [phone]

## 2018-06-07 NOTE — ED Notes (Signed)
Awaiting 2nd set of cultures to begin antibiotic.      Gillermina Hu, RN  06/07/18 [phone]

## 2018-06-07 NOTE — H&P (Signed)
Hospital Medicine History & Physical      PCP: Yaakov Guthrie, APRN - CNP    Date of Admission: 06/07/2018      Chief Complaint: Abdominal pain/scrotal pain      History Of Present Illness:    48 y.o. male who presented with a h/o Hep B and Hep C cirrhosis presents to the ED with ab pain, scrotal swelling.  Pt notes gradual onset, severe, constant, stabbing ab pain.  +Scrotal swelling.  Pt denies fever, n/v, cp, sob, dysuria, diarrhea.  Pt had a paracentesis on Wednesday.  Ascitic fluid unremarkable.      Past Medical History:          Diagnosis Date   ??? Cirrhosis (HCC)    ??? Hepatitis B    ??? Hepatitis C        Past Surgical History:          Procedure Laterality Date   ??? HERNIA REPAIR     ??? HERNIA REPAIR  07/09/2016   ??? KNEE SURGERY Right     arthroscopy    ??? PARACENTESIS Left 05/28/2018    total of 11,200 cc removed per Dr Floyde Parkins   ??? PARACENTESIS Left 06/04/2018    Total of per Dr. Floyde Parkins   ??? PR LAP, INCISIONAL HERNIA REPAIR,REDUCIBLE N/A 07/09/2016    LAPAROSCOPIC INCISIONAL HERNIA REPAIR WITH MESH. POSS OPEN. performed by Loreli Slot, MD at [facility] OR       Medications Prior to Admission:      Prior to Admission medications    Medication Sig Start Date End Date Taking? 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    Historical Provider, MD   spironolactone (ALDACTONE) 100 MG tablet Take 100 mg by mouth daily    Historical  Provider, MD   ibuprofen (ADVIL;MOTRIN) 800 MG tablet Take 1 tablet by mouth every 6 hours as needed for Pain 07/09/16   Loreli Slot, MD   triamcinolone (KENALOG) 0.025 % cream APP EXT AA BID 06/03/16   Historical Provider, MD   polyethylene glycol (GLYCOLAX) powder take 17GM (DISSOLVED IN WATER) by mouth once daily 06/29/16   Historical Provider, MD   ondansetron (ZOFRAN) 4 MG tablet Take 4 mg by mouth Daily  06/16/16   Historical Provider, MD   baclofen (LIORESAL) 10 MG tablet Take 10 mg by mouth twice a week  06/16/16   Historical Provider, MD   tenofovir (VIREAD) 300 MG tablet TK 1 T PO Daily 06/04/16   Historical Provider, MD   furosemide (LASIX) 20 MG tablet Take 40 mg by mouth three times a week     Historical Provider, MD       Allergies:  Nubain [nalbuphine]; Morphine; Penicillin g; and Tylenol [acetaminophen]    Social History:      The patient currently lives at skilled nursing  home.    TOBACCO:   reports that he has been smoking cigarettes. He has been smoking about 0.50 packs per day. He has never used smokeless tobacco.  ETOH:   reports no history of alcohol use.      Family History:      Reviewed in detail and negative for DM, CAD, Cancer, CVA. Positive as follows:        Problem Relation Age of Onset   ??? Cancer Mother        REVIEW OF SYSTEMS:   Pertinent positives as noted in the HPI. All other systems reviewed and negative.    PHYSICAL EXAM:    Vitals:    06/07/18 1628 06/07/18 1734 06/07/18 1740 06/07/18 1836   BP: 111/67  (!) 135/55 (!) 102/47   Pulse: 85 84  81   Resp: 16 16  16    Temp: 100.3 ??F (37.9 ??C)      TempSrc: Oral      SpO2: 99% 99%  97%   Weight: 225 lb (102.1 kg)      Height: 6\' 1"  (1.854 m)        Constitutional: Negative for activity change, chills and fever.   General appearance:  No apparent distress, appears stated age and cooperative.  HEENT:  Normal cephalic, atraumatic without obvious deformity. Pupils equal, round, and reactive to light.  Extra ocular muscles intact.  Conjunctivae/corneas clear.  Gastrointestinal: Positive for abdominal pain.  Negative for diarrhea, nausea and vomiting  Genitourinary: Negative for dysuria.   Positive for diffusely right scrotal swelling   Neck: Supple, with full range of motion. No jugular venous distention. Trachea midline.  Respiratory:  Normal respiratory effort. Clear to auscultation, bilaterally without Rales/Wheezes/Rhonchi.  Cardiovascular:  Regular rate and rhythm with normal S1/S2 without murmurs, rubs or gallops.  Abdomen: Tender, distendedSoft, severely distended. +Tenderness to palpation diffusely.  +Fluid wave.      Musculoskeletal:  No clubbing, cyanosis or edema bilaterally.  Full range of motion without deformity.  Skin: Skin color, texture, turgor normal.  No rashes or lesions.  Neurologic:  Neurovascularly intact without any focal sensory/motor deficits.   Psychiatric:  Alert and oriented, thought content appropriate, normal insight  Capillary Refill: Brisk,< 3 seconds   Peripheral Pulses: +2 palpable, equal bilaterally       Labs:     Recent Labs     06/07/18  1652   WBC 5.5   HGB 7.4*   HCT 23.7*   PLT 123*     Recent Labs     06/06/18  2331 06/07/18  1652   NA 122* 120*   K 4.3 5.2*   CL 91* 91*   CO2 21 19*   BUN 18 21*   CREATININE 1.07 1.00   CALCIUM 8.4* 8.0*     Recent Labs     06/07/18  1652   AST 54*   ALT 23   BILITOT 0.9*   ALKPHOS 160*     Recent Labs     06/07/18  1652   INR 1.3     No results for input(s): CKTOTAL, TROPONINI in the last 72 hours.    Urinalysis:      Lab Results   Component Value Date    NITRU Negative 07/09/2015    WBCUA 0-2 07/10/2015    BACTERIA None 07/10/2015    RBCUA 3-5 07/10/2015    BLOODU Small 07/09/2015    SPECGRAV 1.010 07/09/2015    GLUCOSEU Negative 07/09/2015  Radiology:     CXR: I have reviewed the CXR with the following interpretation: Per radiology  EKG:  I have reviewed the EKG with the following interpretation: Please review    CT ABDOMEN PELVIS W IV CONTRAST Additional  Contrast? None    (Results Pending)       ASSESSMENT:    Active Hospital Problems    Diagnosis Date Noted   ??? Cirrhosis (HCC) [K74.60] 06/07/2018       PLAN:  Abdominal pain, unspecified abdominal location: Gastroenterologist consulted, patient started on PPI, peritoneal abdomen tapped in the emergency room and specimen sent to lab, await results.  Will follow recommendations of gastroenterologist.    Spontaneous bacterial peritonitis (HCC): Infectious disease consulted, will start patient on antibiotic therapy after being tapped in the ED with Cipro 400 mg twice daily IV, vital signs every shift and as needed.    Hyponatremia: Monitor patient's intake and output, to provide and maintain support by IV fluids normal saline.  Vital signs every shift and as needed, repeat blood work in the a.m. CBC with differential,'s chemistry.  Educate patient on proper diet and exercise.    Low hemoglobin/hematocrit: Current hemoglobin is 7.4, current hematocrit is 23.7.  There is no active signs of any bleeding currently.  Patient is dynamically stable.  Will monitor with telemetry.  Gastroenterologist consulted, will trend hemoglobin and hematocrit at work every 8 hours and monitor closely.    Fever, unspecified fever cause: Infectious disease consulted, considering the primary source could possibly be spontaneous bacterial peritonitis.  A tap was done in the ED and a sample was sent, awaiting results.    Tobacco abuse: Smoking cessation provided to patient, will educate and explained to patient possible risk and outcomes.      DVT Prophylaxis: Lovenox 40 mg subcutaneously daily  Diet: General diet   code Status: Full code  PT/OT Eval Status: To evaluate and treat    Dispo - To be determined       Rose Fillers, APRN - CNP    Thank you Yaakov Guthrie, APRN - CNP for the opportunity to be involved in this patient's care. If you have any questions or concerns please feel free to contact me.

## 2018-06-07 NOTE — Progress Notes (Signed)
Patient was seen and examined.  He has a history of hepatitis C, B, alcohol abuse who presents with abdominal pain and fever.  Patient had a recent paracentesis done a few days ago.  His hemoglobin is low.  He denies any bleeding.    On exam his vitals are stable, he has a temperature of 100.3.  His abdomen is tender to touch throughout.  His heart is regular rate, lungs are clear.  Neurologically intact throughout with no deficits.  Patient hemoglobin was noted to be below his baseline in the 7's.  Previously he was in the 9's.  No signs of bleeding.    Assessment and plan  1.  Possible SBP  -Emergency room provider, Dr. Glenna Durand is obtaining a diagnostic tap.  Follow-up ascitic fluid result.  Meanwhile, start Cipro 400 IV twice daily with ID consult.  Patient is penicillin allergic with allergy being throat swelling.    2.  Anemia-no signs of bleeding  -Monitor H&H closely, transfuse hemoglobin below 7, GI consult given history of cirrhosis, possible EGD needed inpatient or outpatient.  -Hold any antiplatelets or anticoagulation.    3.  Rest as per nurse practitioner's note.    Dorann Ou, [facility]

## 2018-06-07 NOTE — ED Notes (Signed)
Seizure precautions placed.      Gillermina Hu, RN  06/07/18 [phone]

## 2018-06-07 NOTE — ED Provider Notes (Signed)
[facility] ED  eMERGENCYdEPARTMENT eNCOUnter      Pt Name: Travis Ortega  MRN: [id number]  Birthdate 1970/09/30  Date of evaluation: 06/07/2018  Provider:Mescal Flinchbaugh Jacques Navy, MD    CHIEF COMPLAINT           HPI  Travis Ortega is a 48 y.o. male per chart review has a h/o Hep B and Hep C cirrhosis presents to the ED with ab pain, scrotal swelling.  Pt notes gradual onset, severe, constant, stabbing ab pain.  +Scrotal swelling.  Pt denies fever, n/v, cp, sob, dysuria, diarrhea.  Pt had a paracentesis on Wednesday.  Ascitic fluid unremarkable.      ROS  Review of Systems   Constitutional: Negative for activity change, chills and fever.   HENT: Negative for ear pain and sore throat.    Eyes: Negative for visual disturbance.   Respiratory: Negative for cough and shortness of breath.    Cardiovascular: Negative for chest pain, palpitations and leg swelling.   Gastrointestinal: Positive for abdominal pain. Negative for diarrhea, nausea and vomiting.   Genitourinary: Negative for dysuria.        Scrotal swelling   Musculoskeletal: Negative for back pain.   Skin: Negative for rash.   Neurological: Negative for dizziness and weakness.       Except as noted above the remainder of the review of systems was reviewed and negative.       PAST MEDICAL HISTORY     Past Medical History:   Diagnosis Date   . Cirrhosis (HCC)    . Hepatitis B    . Hepatitis C          SURGICAL HISTORY       Past Surgical History:   Procedure Laterality Date   . HERNIA REPAIR     . HERNIA REPAIR  07/09/2016   . KNEE SURGERY Right     arthroscopy    . PARACENTESIS Left 05/28/2018    total of 11,200 cc removed per Dr Floyde Parkins   . PARACENTESIS Left 06/04/2018    Total of per Dr. Floyde Parkins   . PR LAP, INCISIONAL HERNIA REPAIR,REDUCIBLE N/A 07/09/2016    LAPAROSCOPIC INCISIONAL HERNIA REPAIR WITH MESH. POSS OPEN. performed by Loreli Slot, MD at [facility] OR         CURRENTMEDICATIONS       Previous Medications    BACLOFEN (LIORESAL) 10 MG TABLET    Take 10 mg by  mouth twice a week     FUROSEMIDE (LASIX) 20 MG TABLET    Take 40 mg by mouth three times a week     IBUPROFEN (ADVIL;MOTRIN) 800 MG TABLET    Take 1 tablet by mouth every 6 hours as needed for Pain    IPRATROPIUM-ALBUTEROL (DUONEB) 0.5-2.5 (3) MG/3ML SOLN NEBULIZER SOLUTION    Inhale 1 vial into the lungs every 6 hours as needed for Shortness of Breath    LACTULOSE ENCEPHALOPATHY 10 GM/15ML SOLN SOLUTION    Take 20 g by mouth 3 times daily Indications: pt unsure of dose    MELATONIN 3 MG TABS TABLET    Take 6 mg by mouth daily    OMEPRAZOLE (PRILOSEC) 20 MG DELAYED RELEASE CAPSULE    Take 20 mg by mouth daily    ONDANSETRON (ZOFRAN) 4 MG TABLET    Take 4 mg by mouth Daily     OXYCODONE (ROXICODONE) 5 MG IMMEDIATE RELEASE TABLET    Take 10 mg by mouth 3 times  daily.    POLYETHYLENE GLYCOL (GLYCOLAX) POWDER    take 17GM (DISSOLVED IN WATER) by mouth once daily    SPIRONOLACTONE (ALDACTONE) 100 MG TABLET    Take 100 mg by mouth daily    TENOFOVIR (VIREAD) 300 MG TABLET    TK 1 T PO Daily    TRIAMCINOLONE (KENALOG) 0.025 % CREAM    APP EXT AA BID       ALLERGIES     Nubain [nalbuphine]; Morphine; Penicillin g; and Tylenol [acetaminophen]    FAMILY HISTORY       Family History   Problem Relation Age of Onset   . Cancer Mother           SOCIAL HISTORY       Social History     Socioeconomic History   . Marital status: Single     Spouse name: Not on file   . Number of children: Not on file   . Years of education: Not on file   . Highest education level: Not on file   Occupational History   . Not on file   Social Needs   . Financial resource strain: Not on file   . Food insecurity:     Worry: Not on file     Inability: Not on file   . Transportation needs:     Medical: Not on file     Non-medical: Not on file   Tobacco Use   . Smoking status: Current Every Day Smoker     Packs/day: 0.50     Types: Cigarettes   . Smokeless tobacco: Never Used   Substance and Sexual Activity   . Alcohol use: No   . Drug use: No   . Sexual  activity: Not on file   Lifestyle   . Physical activity:     Days per week: Not on file     Minutes per session: Not on file   . Stress: Not on file   Relationships   . Social connections:     Talks on phone: Not on file     Gets together: Not on file     Attends religious service: Not on file     Active member of club or organization: Not on file     Attends meetings of clubs or organizations: Not on file     Relationship status: Not on file   . Intimate partner violence:     Fear of current or ex partner: Not on file     Emotionally abused: Not on file     Physically abused: Not on file     Forced sexual activity: Not on file   Other Topics Concern   . Not on file   Social History Narrative   . Not on file         PHYSICAL EXAM       ED Triage Vitals   BP Temp Temp src Pulse Resp SpO2 Height Weight   -- -- -- -- -- -- -- --       Physical Exam  Vitals signs and nursing note reviewed.   Constitutional:       Appearance: He is well-developed.   HENT:      Head: Normocephalic.      Right Ear: External ear normal.      Left Ear: External ear normal.   Eyes:      Conjunctiva/sclera: Conjunctivae normal.      Pupils: Pupils are equal, round, and  reactive to light.   Neck:      Musculoskeletal: Normal range of motion and neck supple.   Cardiovascular:      Rate and Rhythm: Normal rate and regular rhythm.      Heart sounds: Normal heart sounds.   Pulmonary:      Effort: Pulmonary effort is normal.      Breath sounds: Normal breath sounds.   Abdominal:      Comments: Soft, severely distended. +Tenderness to palpation diffusely.  +Fluid wave.     Genitourinary:     Comments: +Diffusely R scrotal swelling  Musculoskeletal: Normal range of motion.   Skin:     General: Skin is warm and dry.   Neurological:      Mental Status: He is alert and oriented to person, place, and time.   Psychiatric:         Mood and Affect: Mood normal.           MDM  48 yo male presents to the ED with ab pain, scrotal swelling.  Pt noted to have  temp 100.3 in the ED.  Pt given IV morphine, IV zofran, IV toradol with moderate relief.  Labs remarkable for Na 120, AST 54, Hb 7.4, INR 1.3.  Pt recently had a paracentesis with ascites fluid negative.  CT AP shows diffuse ascites extending into R scrotum.  Unsure of etiology of fever.  Suspect possible SBP given recent paracentesis.  Given possible SBP, blood cultures drawn and pt given IV cipro (pt allergic to pcn).  Given fever, ab pain, concern for SBP, case discussed with Dr. Rene Paci and pt admitted to medicine in stable condition.  Pt understands plan.       Paracentesis Procedure Note  Indication: Ascites    Consent: The patient provided verbal consent for this procedure.    Procedure: The patient was placed in the right lateral decubitus position and the appropriate landmarks were identified. The skin over the puncture site in the right lower quadrant region was prepped with betadine and draped in a sterile fashion. Local anesthesia was obtained by infiltration using 1% Lidocaine with epinephrine. A needle was then advanced into the abdominal cavity. Fluid was returned which was cloudy.  A total volume of 50 cc was withdrawn which was sent to the lab for cell count and differential, total protein, albumin, glucose and gram stain and culture. The needle was then withdrawn and a sterile dressing was placed over the site.     The patient tolerated the procedure well.    Complications: None        FINAL IMPRESSION      1. Abdominal pain, unspecified abdominal location    2. Fever, unspecified fever cause    3. Spontaneous bacterial peritonitis (HCC)    4. Hyponatremia          DISPOSITION/PLAN   DISPOSITION Admitted 06/07/2018 06:43:13 PM        DISCHARGE MEDICATIONS:  @         Cristie Hem, MD(electronically signed)  Attending Emergency Physician            Cristie Hem, MD  06/07/18 [phone]

## 2018-06-07 NOTE — ED Notes (Signed)
Called lab to pull blood work      [employer], [place]  06/07/18 [phone]

## 2018-06-07 NOTE — ED Notes (Signed)
Bed: 15  Expected date: 06/07/18  Expected time:   Means of arrival:   Comments:  2M [facility], vomiting neck and back pain     Wilburn Mylar, RN  06/07/18 1623

## 2018-06-07 NOTE — ED Notes (Signed)
Called Lab for 2nd set of cultures     [employer], RN  06/07/18 [phone]

## 2018-06-08 ENCOUNTER — Inpatient Hospital Stay: Admit: 2018-06-08 | Payer: MEDICARE | Primary: Family

## 2018-06-08 LAB — CBC WITH AUTO DIFFERENTIAL
Bands Relative: 3 %
Basophils %: 3 %
Basophils Absolute: 0.1 10*3/uL (ref 0.0–0.2)
Eosinophils %: 1 %
Eosinophils Absolute: 0 10*3/uL (ref 0.0–0.7)
Hematocrit: 27.2 % — ABNORMAL LOW (ref 42.0–52.0)
Hemoglobin: 8.3 g/dL — ABNORMAL LOW (ref 14.0–18.0)
Lymphocytes %: 23 %
Lymphocytes Absolute: 0.7 10*3/uL — ABNORMAL LOW (ref 1.0–4.8)
MCH: 21.6 pg — ABNORMAL LOW (ref 27.0–31.3)
MCHC: 30.6 % — ABNORMAL LOW (ref 33.0–37.0)
MCV: 70.7 fL — ABNORMAL LOW (ref 80.0–100.0)
Monocytes %: 5.7 %
Monocytes Absolute: 0.2 10*3/uL (ref 0.2–0.8)
Neutrophils %: 64 %
Neutrophils Absolute: 2.1 10*3/uL (ref 1.4–6.5)
PLATELET SLIDE REVIEW: DECREASED
Platelets: 120 10*3/uL — ABNORMAL LOW (ref 130–400)
Promonocytes: 1 % — AB
RBC: 3.85 M/uL — ABNORMAL LOW (ref 4.70–6.10)
RDW: 23.3 % — ABNORMAL HIGH (ref 11.5–14.5)
WBC: 3.2 10*3/uL — ABNORMAL LOW (ref 4.8–10.8)

## 2018-06-08 LAB — COMPREHENSIVE METABOLIC PANEL W/ REFLEX TO MG FOR LOW K
ALT: 25 U/L (ref 0–41)
AST: 51 U/L — ABNORMAL HIGH (ref 0–40)
Albumin: 2.7 g/dL — ABNORMAL LOW (ref 3.5–4.6)
Alkaline Phosphatase: 157 U/L — ABNORMAL HIGH (ref 35–104)
Anion Gap: 10 mEq/L (ref 9–15)
BUN: 22 mg/dL — ABNORMAL HIGH (ref 6–20)
CO2: 20 mEq/L (ref 20–31)
Calcium: 8 mg/dL — ABNORMAL LOW (ref 8.5–9.9)
Chloride: 97 mEq/L (ref 95–107)
Creatinine: 1.02 mg/dL (ref 0.70–1.20)
GFR African American: >60 (ref >60)
GFR Non-African American: >60 (ref >60)
Globulin: 4.1 g/dL — ABNORMAL HIGH (ref 2.3–3.5)
Glucose: 146 mg/dL — ABNORMAL HIGH (ref 70–99)
Potassium reflex Magnesium: 4.6 mEq/L (ref 3.4–4.9)
Sodium: 127 mEq/L — ABNORMAL LOW (ref 135–144)
Total Bilirubin: 0.5 mg/dL (ref 0.2–0.7)
Total Protein: 6.8 g/dL (ref 6.3–8.0)

## 2018-06-08 LAB — GLUCOSE, BODY FLUID: Glucose, Fluid: 113 mg/dL

## 2018-06-08 LAB — CELL COUNT WITH DIFFERENTIAL, BODY FLUID
Lymphocytes, Body Fluid: 81 %
Monocyte Count, Fluid: 18 %
Neutrophil Count, Fluid: 1 %
Nucl Cell, Fluid: 26 /mm3
Number of Cells Counted Fluid: 100
RBC, Fluid: 392 /mm3

## 2018-06-08 LAB — ALBUMIN, FLUID: Albumin, Fluid: <0.2 g/dL

## 2018-06-08 LAB — PROTEIN, BODY FLUID: Protein, Fluid: 0.9 g/dL

## 2018-06-08 LAB — TRIGLYCERIDES, BODY FLUIDS: Triglycerides, Fluid: 215 mg/dL

## 2018-06-08 LAB — GRAM STAIN

## 2018-06-08 MED ORDER — OXYCODONE HCL 5 MG PO TABS
5 MG | Freq: Four times a day (QID) | ORAL | Status: DC | PRN
Start: 2018-06-08 — End: 2018-06-07

## 2018-06-08 MED ORDER — OXYCODONE HCL 5 MG PO TABS
5 MG | Freq: Four times a day (QID) | ORAL | Status: DC | PRN
Start: 2018-06-08 — End: 2018-06-08
  Administered 2018-06-08 (×3): 10 mg via ORAL

## 2018-06-08 MED ORDER — LEVOFLOXACIN IN D5W 500 MG/100ML IV SOLN
500100 MG/100ML | INTRAVENOUS | Status: DC
Start: 2018-06-08 — End: 2018-06-09
  Administered 2018-06-08: 20:00:00 500 mg via INTRAVENOUS

## 2018-06-08 MED ORDER — NORMAL SALINE FLUSH 0.9 % IV SOLN
0.9 % | Freq: Two times a day (BID) | INTRAVENOUS | Status: DC
Start: 2018-06-08 — End: 2018-06-16
  Administered 2018-06-09 – 2018-06-16 (×13): 10 mL via INTRAVENOUS

## 2018-06-08 MED ORDER — CIPROFLOXACIN IN D5W 400 MG/200ML IV SOLN
400200 MG/200ML | Freq: Two times a day (BID) | INTRAVENOUS | Status: DC
Start: 2018-06-08 — End: 2018-06-08

## 2018-06-08 MED ORDER — TENOFOVIR DISOPROXIL FUMARATE 300 MG PO TABS
300 MG | Freq: Every day | ORAL | Status: DC
Start: 2018-06-08 — End: 2018-06-16

## 2018-06-08 MED ORDER — ENOXAPARIN SODIUM 40 MG/0.4ML SC SOLN
400.4 MG/0.4ML | Freq: Every day | SUBCUTANEOUS | Status: DC
Start: 2018-06-08 — End: 2018-06-16
  Administered 2018-06-08 – 2018-06-11 (×3): 40 mg via SUBCUTANEOUS

## 2018-06-08 MED ORDER — HYDROMORPHONE HCL 1 MG/ML IJ SOLN
1 MG/ML | INTRAMUSCULAR | Status: DC | PRN
Start: 2018-06-08 — End: 2018-06-09

## 2018-06-08 MED ORDER — PROMETHAZINE HCL 25 MG PO TABS
25 MG | Freq: Four times a day (QID) | ORAL | Status: DC | PRN
Start: 2018-06-08 — End: 2018-06-16

## 2018-06-08 MED ORDER — VANCOMYCIN INTERMITTENT DOSING (PLACEHOLDER)
INTRAVENOUS | Status: DC
Start: 2018-06-08 — End: 2018-06-16

## 2018-06-08 MED ORDER — ONDANSETRON HCL 4 MG/2ML IJ SOLN
4 MG/2ML | Freq: Four times a day (QID) | INTRAMUSCULAR | Status: DC | PRN
Start: 2018-06-08 — End: 2018-06-16
  Administered 2018-06-08 – 2018-06-16 (×13): 4 mg via INTRAVENOUS

## 2018-06-08 MED ORDER — PANTOPRAZOLE SODIUM 40 MG PO TBEC
40 MG | Freq: Every day | ORAL | Status: DC
Start: 2018-06-08 — End: 2018-06-16
  Administered 2018-06-09 – 2018-06-16 (×7): 40 mg via ORAL

## 2018-06-08 MED ORDER — SODIUM CHLORIDE 0.9 % IV SOLN
0.9 | INTRAVENOUS | Status: DC
Start: 2018-06-08 — End: 2018-06-08
  Administered 2018-06-08: 02:00:00 via INTRAVENOUS

## 2018-06-08 MED ORDER — NICOTINE 21 MG/24HR TD PT24
21 | Freq: Every day | TRANSDERMAL | Status: DC
Start: 2018-06-08 — End: 2018-06-16
  Administered 2018-06-08 – 2018-06-16 (×10): 1 via TRANSDERMAL

## 2018-06-08 MED ORDER — DEXTROSE 5 % IV SOLN
5 % | Freq: Two times a day (BID) | INTRAVENOUS | Status: DC
Start: 2018-06-08 — End: 2018-06-12
  Administered 2018-06-08 – 2018-06-12 (×8): 1500 mg via INTRAVENOUS

## 2018-06-08 MED ORDER — LACTULOSE 10 GM/15ML PO SOLN
10 GM/15ML | Freq: Three times a day (TID) | ORAL | Status: DC
Start: 2018-06-08 — End: 2018-06-16
  Administered 2018-06-08 – 2018-06-16 (×12): 20 g via ORAL

## 2018-06-08 MED ORDER — ORPHENADRINE CITRATE 30 MG/ML IJ SOLN
30 MG/ML | Freq: Two times a day (BID) | INTRAMUSCULAR | Status: DC
Start: 2018-06-08 — End: 2018-06-09
  Administered 2018-06-08 – 2018-06-09 (×3): 60 mg via INTRAMUSCULAR

## 2018-06-08 MED ORDER — SODIUM CHLORIDE 0.9 % IV SOLN
0.9 % | INTRAVENOUS | Status: DC
Start: 2018-06-08 — End: 2018-06-08
  Administered 2018-06-08 (×2): 8 mg/h via INTRAVENOUS

## 2018-06-08 MED ORDER — NORMAL SALINE FLUSH 0.9 % IV SOLN
0.9 | INTRAVENOUS | Status: DC | PRN
Start: 2018-06-08 — End: 2018-06-16

## 2018-06-08 MED ORDER — SPIRONOLACTONE 50 MG PO TABS
50 MG | Freq: Every day | ORAL | Status: DC
Start: 2018-06-08 — End: 2018-06-08
  Administered 2018-06-08: 02:00:00 100 mg via ORAL

## 2018-06-08 MED ORDER — CIPROFLOXACIN IN D5W 400 MG/200ML IV SOLN
400200 MG/200ML | Freq: Once | INTRAVENOUS | Status: AC
Start: 2018-06-08 — End: 2018-06-07
  Administered 2018-06-08: 02:00:00 400 mg via INTRAVENOUS

## 2018-06-08 MED ORDER — IBUPROFEN 800 MG PO TABS
800 MG | Freq: Three times a day (TID) | ORAL | Status: DC | PRN
Start: 2018-06-08 — End: 2018-06-08

## 2018-06-08 MED ORDER — HYDROMORPHONE HCL 1 MG/ML IJ SOLN
1 MG/ML | INTRAMUSCULAR | Status: DC | PRN
Start: 2018-06-08 — End: 2018-06-09
  Administered 2018-06-08 – 2018-06-09 (×9): 1 mg via INTRAVENOUS

## 2018-06-08 MED ORDER — HYDROMORPHONE HCL 1 MG/ML IJ SOLN
1 MG/ML | INTRAMUSCULAR | Status: DC | PRN
Start: 2018-06-08 — End: 2018-06-08

## 2018-06-08 MED ORDER — CIPROFLOXACIN IN D5W 400 MG/200ML IV SOLN
400200 MG/200ML | Freq: Two times a day (BID) | INTRAVENOUS | Status: DC
Start: 2018-06-08 — End: 2018-06-08
  Administered 2018-06-08: 15:00:00 400 mg via INTRAVENOUS

## 2018-06-08 MED ORDER — FUROSEMIDE 20 MG PO TABS
20 MG | Freq: Two times a day (BID) | ORAL | Status: DC
Start: 2018-06-08 — End: 2018-06-10
  Administered 2018-06-08 – 2018-06-10 (×5): 60 mg via ORAL

## 2018-06-08 MED ORDER — BACLOFEN 10 MG PO TABS
10 MG | ORAL | Status: DC
Start: 2018-06-08 — End: 2018-06-08

## 2018-06-08 MED ORDER — MELATONIN 3 MG PO TABS
3 MG | Freq: Every day | ORAL | Status: DC
Start: 2018-06-08 — End: 2018-06-09
  Administered 2018-06-08 – 2018-06-09 (×2): 6 mg via ORAL

## 2018-06-08 MED ORDER — OMEPRAZOLE 20 MG PO CPDR
20 MG | Freq: Every day | ORAL | Status: DC
Start: 2018-06-08 — End: 2018-06-07

## 2018-06-08 MED ORDER — POLYETHYLENE GLYCOL 3350 17 G PO PACK
17 g | Freq: Every day | ORAL | Status: DC | PRN
Start: 2018-06-08 — End: 2018-06-16

## 2018-06-08 MED ORDER — LIDOCAINE 4 % EX PTCH
4 % | Freq: Every day | CUTANEOUS | Status: DC
Start: 2018-06-08 — End: 2018-06-16
  Administered 2018-06-08 – 2018-06-16 (×9): 3 via TRANSDERMAL

## 2018-06-08 MED FILL — NICOTINE 21 MG/24HR TD PT24: 21 mg/(24.h) | TRANSDERMAL | Qty: 1

## 2018-06-08 MED FILL — CIPROFLOXACIN IN D5W 400 MG/200ML IV SOLN: 400 MG/200ML | INTRAVENOUS | Qty: 200

## 2018-06-08 MED FILL — OXYCODONE HCL 5 MG PO TABS: 5 mg | ORAL | Qty: 2

## 2018-06-08 MED FILL — LIDOCAINE PAIN RELIEF 4 % EX PTCH: 4 % | CUTANEOUS | Qty: 3

## 2018-06-08 MED FILL — MONOJECT FLUSH SYRINGE 0.9 % IV SOLN: 0.9 % | INTRAVENOUS | Qty: 10

## 2018-06-08 MED FILL — HYDROMORPHONE HCL 1 MG/ML IJ SOLN: 1 mg/mL | INTRAMUSCULAR | Qty: 1

## 2018-06-08 MED FILL — SODIUM CHLORIDE 0.9 % IV SOLN: 0.9 % | INTRAVENOUS | Qty: 1000

## 2018-06-08 MED FILL — LACTULOSE 10 GM/15ML PO SOLN: 10 GM/15ML | ORAL | Qty: 30

## 2018-06-08 MED FILL — PROTONIX 40 MG IV SOLR: 40 mg | INTRAVENOUS | Qty: 80

## 2018-06-08 MED FILL — TENOFOVIR DISOPROXIL FUMARATE 300 MG PO TABS: 300 mg | ORAL | Qty: 1

## 2018-06-08 MED FILL — ORPHENADRINE CITRATE 30 MG/ML IJ SOLN: 30 mg/mL | INTRAMUSCULAR | Qty: 2

## 2018-06-08 MED FILL — SPIRONOLACTONE 50 MG PO TABS: 50 mg | ORAL | Qty: 2

## 2018-06-08 MED FILL — FUROSEMIDE 20 MG PO TABS: 20 mg | ORAL | Qty: 1

## 2018-06-08 MED FILL — ONDANSETRON HCL 4 MG/2ML IJ SOLN: 4 MG/2ML | INTRAMUSCULAR | Qty: 2

## 2018-06-08 MED FILL — VANCOMYCIN HCL 5 G IV SOLR: 5 g | INTRAVENOUS | Qty: 1500

## 2018-06-08 MED FILL — LEVOFLOXACIN IN D5W 500 MG/100ML IV SOLN: 500 MG/100ML | INTRAVENOUS | Qty: 100

## 2018-06-08 MED FILL — MELATONIN 3 MG PO TABS: 3 mg | ORAL | Qty: 2

## 2018-06-08 MED FILL — LOVENOX 40 MG/0.4ML SC SOLN: 40 MG/0.4ML | SUBCUTANEOUS | Qty: 0.4

## 2018-06-08 NOTE — Progress Notes (Signed)
Physical Therapy   [facility] B[facility]/J096-43    NAME: Travis Ortega    DOB: [birth year] (48 y.o.)  MRN: [id number]    Account: [id number]  Gender: male    PT evaluation and treatment orders received. Chart reviewed. PT eval attempted.     Patient Unavailable: left unit for testing. Will attempt as able  Electronically signed by Clyde Lundborg, PT on 06/08/18 at 2:19 PM

## 2018-06-08 NOTE — Other (Addendum)
Pt awake in bed. Medicated with PRN roxicodone per request. Accepted AM meds without problem. Pt abdomen remains distended. Scrotal edema persists. VSS. Afebrile this morning. Dr. Rene Paci in to see pt.     1410 Pt to xray via w/c.

## 2018-06-08 NOTE — Care Coordination-Inpatient (Signed)
PATIENT WAS ADMITTED FROM [facility]. PATIENT WAS OFFERED FREEDOM OF CHOICE REGARDING WHERE HE WOULD LIKE TO GO AT DISCHARGE AND HE STATES HIS PLAN IS RETURN TO [facility] AT DISCHARGE. LSW TO F/U ON Monday TO DETERMINE PATIENTS NEEDS FOR RETURN ONCE MEDICALLY CLEARED.

## 2018-06-08 NOTE — Progress Notes (Signed)
Physician Progress Note    06/08/2018   10:29 AM    Name:  Travis Ortega  MRN:    [id number]     IP Day: 1     Admit Date: 06/07/2018  4:23 PM  PCP: Yaakov Guthrie, APRN - CNP    Code Status:  Full Code    Subjective:      no new events. C/o abdominal pain that is not controlled.     Physical Examination:      Vitals:  BP 119/69    Pulse 69    Temp 98.1 ??F (36.7 ??C) (Oral)    Resp 22    Ht 6\' 1"  (1.854 m)    Wt 225 lb (102.1 kg)    SpO2 99%    BMI 29.69 kg/m??   Temp (24hrs), Avg:98.9 ??F (37.2 ??C), Min:98.1 ??F (36.7 ??C), Max:100.3 ??F (37.9 ??C)      General appearance: alert, cooperative and no distress  Mental Status: oriented to person, place and time and normal affect  Lungs: clear to auscultation bilaterally, normal effort  Heart: regular rate and rhythm, no murmur  Abdomen: distended abd and scrotum with TTP in the abd without guarding or rebound tenderness but has fluid shift and dullness to percussion   Extremities: no edema, redness, tenderness in the calves  Skin: no gross lesions, rashes    Data:     Labs:  Recent Labs     06/07/18  1652   WBC 5.5   HGB 7.4*   PLT 123*     Recent Labs     06/06/18  2331 06/07/18  1652   NA 122* 120*   K 4.3 5.2*   CL 91* 91*   CO2 21 19*   BUN 18 21*   CREATININE 1.07 1.00   GLUCOSE 136* 108*     Recent Labs     06/07/18  1652   AST 54*   ALT 23   BILITOT 0.9*   ALKPHOS 160*       Current Facility-Administered Medications   Medication Dose Route Frequency Provider Last Rate Last Dose   ??? HYDROmorphone (DILAUDID) injection 0.5 mg  0.5 mg Intravenous Q4H PRN Coralie Common Darlys Buis, DO       ??? ciprofloxacin (CIPRO) IVPB 400 mg  400 mg Intravenous Q12H Emy Angevine R Samentha Perham, DO       ??? sodium chloride flush 0.9 % injection 10 mL  10 mL Intravenous 2 times per day Rose Fillers, APRN - CNP       ??? sodium chloride flush 0.9 % injection 10 mL  10 mL Intravenous PRN Rose Fillers, APRN - CNP       ??? polyethylene glycol (GLYCOLAX) packet 17 g  17 g Oral Daily PRN Rose Fillers, APRN - CNP       ???  promethazine (PHENERGAN) tablet 12.5 mg  12.5 mg Oral Q6H PRN Rose Fillers, APRN - CNP       ??? ondansetron (ZOFRAN) injection 4 mg  4 mg Intravenous Q6H PRN Rose Fillers, APRN - CNP       ??? enoxaparin (LOVENOX) injection 40 mg  40 mg Subcutaneous Daily Rose Fillers, APRN - CNP   40 mg at 06/07/18 2120   ??? nicotine (NICODERM CQ) 21 MG/24HR 1 patch  1 patch Transdermal Daily Rose Fillers, APRN - CNP   1 patch at 06/08/18 0949   ??? [START ON 06/09/2018] baclofen (LIORESAL) tablet 10 mg  10 mg Oral Once per  day on Mon Thu Rose Fillers, APRN - CNP       ??? furosemide (LASIX) tablet 60 mg  60 mg Oral BID Rose Fillers, APRN - CNP   60 mg at 06/08/18 0949   ??? lactulose (CHRONULAC) 10 GM/15ML solution 20 g  20 g Oral TID Rose Fillers, APRN - CNP   20 g at 06/07/18 2049   ??? melatonin tablet 6 mg  6 mg Oral Daily Rose Fillers, APRN - CNP   6 mg at 06/07/18 2049   ??? tenofovir (VIREAD) tablet 300 mg  300 mg Oral Daily Rose Fillers, APRN - CNP       ??? oxyCODONE (ROXICODONE) immediate release tablet 10 mg  10 mg Oral Q6H PRN Violeta Gelinas, MD   10 mg at 06/08/18 3403     Assessment and Plan:          Active problems:    # decompensated liver cirrhosis with large volume ascites and concern for SBP  - low PMN count in ascitic fluid on para done in ED by Dr Glenna Durand 2/22 but milky appearance   - add on albumin to ascitic fluid   - hold aldactone due to hyperkalemia   - resume lasix  - dc IVF  - appreciate GI input  - will consult IR for repeat diagnostic paracentesis and a therapeutic paracentesis   - reusme lactulose   - obtain CMP and CBC   - will refer to [facility] as outpt after therapeutic paracentesis for possible TIPS evaluation     # abdominal pain/ lowe grade fever- ?SBP  - less likely SBP, but will continue cipro IV (PNC allergic) given milky appearance of ascitic fluid and repeat para tomorrow   - ID on consult   - pain management consult     # anemia- recent EGD- h/o EV, no active bleeding  - dc protonic gtt, resume PO protonix  - monitor H/H.  Appreciate GI input       DVT/PPx        DISCHARGE PLANNING  Pending w/u        Electronically signed by Pearletha Furl, DO on 06/08/2018 at 10:29 AM

## 2018-06-08 NOTE — Consults (Signed)
INITIAL CONSULT -PAIN MANAGEMENT     SERVICE DATE:  06/08/2018   SERVICE TIME:  11:04 AM  Admission date 06/07/2018  REASON FORCONSULT: Severe intractable uncontrolled abdominal pain due to rapidly accumulating ascites after recent paracentesis  REQUESTING PHYSICIAN:  Arnette Schaumann, MD  PRIMARY CARE PHYSICIAN:Travis Berneice Gandy, APRN - CNP    Chief Complaint   Patient presents with   . Groin Pain     neck and back pain, emisis        HISTORY OF PRESENTILLNESS:  Travis Ortega is a 48 y.o. male who presents for severe intractable abdominal and scrotal pain.    Patient has history of advanced liver cirrhosis, he has been receiving multiple paracentesis, last paracentesis was done Wednesday by interventional radiology, patient stated that 24 hours later he has rapidly accumulating fluid in his abdomen which is unusual for his pattern of reaccumulating the fluid, also he said the fluid become so intense and concentrated by the groin area caused him severe intractable pain, also he has running low-grade fever become nauseated not able to tolerate the pain came to the hospital.    Patient was initially medicated with Toradol also was given p.o. pain medication which was not helping his pain.      I have consulted to evaluate the patient and manage his acute severe on top of chronic abdominal pain and providing him with treatment plan until able to further evaluated by GI and  interventional radiology.    PAIN  ASSESSMENT:    constant, excruciating    boring, sharp, shooting, stabbing and throbbing    pain is excruciating , incapacitating and unbearable (9-10 pain scale)    PAST MEDICAL HISTORY:    Past Medical History:   Diagnosis Date   . Cirrhosis (HCC)    . Hepatitis B    . Hepatitis C      PAST SURGICAL HISTORY:    Past Surgical History:   Procedure Laterality Date   . HERNIA REPAIR     . HERNIA REPAIR  07/09/2016   . KNEE SURGERY Right     arthroscopy    . PARACENTESIS Left 05/28/2018    total of 11,200 cc removed per  Dr Floyde Parkins   . PARACENTESIS Left 06/04/2018    Total of per Dr. Floyde Parkins   . PR LAP, INCISIONAL HERNIA REPAIR,REDUCIBLE N/A 07/09/2016    LAPAROSCOPIC INCISIONAL HERNIA REPAIR WITH MESH. POSS OPEN. performed by Loreli Slot, MD at [facility]           FAMILY HISTORY:    Family History   Problem Relation Age of Onset   . Cancer Mother      SOCIALHISTORY:    Social History     Socioeconomic History   . Marital status: Single     Spouse name: Not on file   . Number of children: Not on file   . Years of education: Not on file   . Highest education level: Not on file   Occupational History   . Not on file   Social Needs   . Financial resource strain: Not on file   . Food insecurity:     Worry: Not on file     Inability: Not on file   . Transportation needs:     Medical: Not on file     Non-medical: Not on file   Tobacco Use   . Smoking status: Current Every Day Smoker     Packs/day: 0.50  Types: Cigarettes   . Smokeless tobacco: Never Used   Substance and Sexual Activity   . Alcohol use: No   . Drug use: No   . Sexual activity: Not on file   Lifestyle   . Physical activity:     Days per week: Not on file     Minutes per session: Not on file   . Stress: Not on file   Relationships   . Social connections:     Talks on phone: Not on file     Gets together: Not on file     Attends religious service: Not on file     Active member of club or organization: Not on file     Attends meetings of clubs or organizations: Not on file     Relationship status: Not on file   . Intimate partner violence:     Fear of current or ex partner: Not on file     Emotionally abused: Not on file     Physically abused: Not on file     Forced sexual activity: Not on file   Other Topics Concern   . Not on file   Social History Narrative   . Not on file     PSYCHOLOGICAL HISTORY: none  Patient denies any current history of illicit drug use, denies any current history of alcohol abuse    MEDICATIONS:  Medications Prior to Admission: lactulose  encephalopathy 10 GM/15ML SOLN solution, Take 20 g by mouth 3 times daily Indications: pt unsure of dose  ipratropium-albuterol (DUONEB) 0.5-2.5 (3) MG/3ML SOLN nebulizer solution, Inhale 1 vial into the lungs every 6 hours as needed for Shortness of Breath  melatonin 3 MG TABS tablet, Take 6 mg by mouth daily  omeprazole (PRILOSEC) 20 MG delayed release capsule, Take 20 mg by mouth daily  oxyCODONE (ROXICODONE) 5 MG immediate release tablet, Take 15 mg by mouth every 4 hours as needed.   spironolactone (ALDACTONE) 100 MG tablet, Take 100 mg by mouth daily  ibuprofen (ADVIL;MOTRIN) 800 MG tablet, Take 1 tablet by mouth every 6 hours as needed for Pain  triamcinolone (KENALOG) 0.025 % cream, APP EXT AA BID  polyethylene glycol (GLYCOLAX) powder, take 17GM (DISSOLVED IN WATER) by mouth once daily  ondansetron (ZOFRAN) 4 MG tablet, Take 4 mg by mouth Daily   baclofen (LIORESAL) 10 MG tablet, Take 10 mg by mouth twice a week   tenofovir (VIREAD) 300 MG tablet, TK 1 T PO Daily  furosemide (LASIX) 20 MG tablet, Take 60 mg by mouth 2 times daily   @IPMED @    ALLERGIES:  Nubain [nalbuphine]; Morphine; Penicillin g; and Tylenol [acetaminophen]    COMPLETE REVIEW OF SYSTEMS:  As noted in HPI, 12 point ROS reviewed and otherwise negative.  Review of Systems   Constitutional: Positive for activity change, appetite change, chills, diaphoresis, fatigue, fever and unexpected weight change.   Eyes: Negative.    Respiratory: Positive for chest tightness and shortness of breath. Negative for apnea, cough, choking, wheezing and stridor.    Cardiovascular: Positive for leg swelling. Negative for chest pain and palpitations.   Gastrointestinal: Positive for abdominal distention, abdominal pain, nausea and rectal pain. Negative for anal bleeding, blood in stool, constipation, diarrhea and vomiting.   Endocrine: Negative.    Genitourinary: Positive for decreased urine volume, difficulty urinating, flank pain, penile pain, scrotal swelling  and testicular pain. Negative for discharge, dysuria, enuresis, frequency, genital sores, hematuria, penile swelling and urgency.   Musculoskeletal: Positive  for arthralgias, back pain, joint swelling and myalgias. Negative for gait problem, neck pain and neck stiffness.   Skin: Negative.    Allergic/Immunologic: Positive for immunocompromised state. Negative for food allergies.   Neurological: Positive for tremors and weakness. Negative for dizziness, seizures, syncope, facial asymmetry, speech difficulty, light-headedness, numbness and headaches.   Hematological: Negative for adenopathy. Bruises/bleeds easily.   Psychiatric/Behavioral: Positive for sleep disturbance. Negative for agitation, behavioral problems, confusion, decreased concentration, dysphoric mood, hallucinations, self-injury and suicidal ideas. The patient is nervous/anxious. The patient is not hyperactive.          OBJECTIVE  PHYSICAL EXAM:  BP 119/69   Pulse 69   Temp 98.1 F (36.7 C) (Oral)   Resp 22   Ht  (1.854 m)   Wt 225 lb (102.1 kg)   SpO2 99%   BMI 29.69 kg/m   Body mass index is 29.69 kg/m.  CONSTITUTIONAL: Patient seem to be awake alert cooperative but appears to be in severe distress due to severe generalized pain, having difficulty moving, lying in the recumbent position with abdomen severely distended  EYES:  vision intact  ENT:  normocepalic, without obvious abnormality, atraumatic  NECK:    mildly restricted range of motion cervical spine, mild myalgia bilateral, supple, symmetrical, trachea midline, skin normal and no stridor  BACK:  Symmetric,  Moderately restricted and moderately painful thoracolumbar range of motion, restricted mostly by patient body habitus.  LUNGS: Diminished breathing effort and pattern due to patient large abdominal distended, he is in the flat supine position  CARDIOVASCULAR:  tachycardic with regular rhythm  ABDOMEN:  's abdominal exam severely tender, severely distended, positive rebound  tenderness all across, severe tenderness across the groin area pelvic area and severe swelling across the pelvic and midline area  MUSCULOSKELETAL: Generalized myalgia all across  there is no redness, warmth, or swelling of the joints  full range of motion noted  motor strength is 5 out of 5 all extremities bilaterally  tone is normal  NEUROLOGIC:  Mental exam seem to be intact, patient seem to be lethargic, moderate distress due to severe pain, cranial exam intact, he has fine tremor bilateral upper extremity, he has intact central nervous system exam  SKIN:  no bruising or bleeding    DATA:   Diagnostic tests reviewed for today's visit:    All labs and imaging results reviewed.     Lab Results   Component Value Date    WBC 5.5 06/07/2018    HGB 7.4 06/07/2018    HCT 23.7 06/07/2018    MCV 70.0 06/07/2018    PLT 123 06/07/2018    NA 120 06/07/2018    K 5.2 06/07/2018    CL 91 06/07/2018    CO2 19 06/07/2018    BUN 21 06/07/2018    CREATININE 1.00 06/07/2018    CALCIUM 8.0 06/07/2018    ALKPHOS 160 06/07/2018    ALT 23 06/07/2018    AST 54 06/07/2018    BILITOT 0.9 06/07/2018    LABALBU 2.7 06/07/2018    LABALBU 3.1 05/31/2016       Ct Abdomen Pelvis W Iv Contrast Additional Contrast? None    Result Date: 06/08/2018  Patient MRN: [id number] DOB: 05/21/1970 Age:  55 years Order Date: 06/07/2018 4:30 PM. Gender: Male Exam: CT ABDOMEN PELVIS W IV CONTRAST Number of Images: 603 Indication:   Abdominal pain, swelling, testicular swelling, history of cirrhosis Contrast dosage: Isovue-370, 100 mL, IV Comparison: None Findings: This examination was  performed on a CT scanner with dose reduction. Technique: Low-dose CT  acquisition technique included one of following options; 1 . Automated exposure control, 2. Adjustment of MA and or KV according to patient's size or 3. Use of iterative reconstruction. Visualized lung bases demonstrate no evidence of noncalcified pulmonary nodule, spiculated mass, pneumothorax, hemothorax, pleural  effusion or focal areas of airspace consolidation.. Large volume intraperitoneal ascites with a nodular contour of the shrunken appearing liver. Splenomegaly. Mild sized hiatal hernia. Esophageal varices noted. Pancreas, adrenal glands and kidneys unremarkable. Appendix not identified. Fluid extends into a large right inguinal hernia and into the scrotum where there are large bilateral fluid collections. No definitive evidence of bowel obstruction or free intraperitoneal air. Delayed imaging sequences demonstrate there is partial opacification of the renal, ureteral or bladder areas. No large filling defect or mass is seen. No contrast extravasation outside normal renal, ureteral or bladder confines. There are portions of the  renal, ureteral or bladder systems are not opacified and are therefore not evaluated.. Vertebral body heights and intervertebral disc spaces are well-preserved. There is normal sagittal alignment of the visualized spine. No lytic or blastic bony lesions..     1. Findings consistent with decompensated cirrhosis with large volume intraperitoneal ascites, a shrunken liver with nodular contour, splenomegaly and esophageal variceal formation. 2. No evidence of bowel obstruction. 3. There is a large fluid-filled right inguinal hernia with large amounts of fluid extending within the scrotum.    US Guided Paracentesis    1.  Status post technically successful ultrasound-guided paracentesis.  CLINICAL HISTORY:Dream KERCE is a Male of 29 years age, referred for Ultrasound Guided Paracentesis.  PROCEDURE: Survey of the abdomen showed large amount of ascites fluid. After obtaining informed consent, the patient was positioned supine on the sonography table. Using ultrasound, the skin over the left hemiabdomen was locally anesthetized with 1% lidocaine.  Following that, a Yueh needle was advanced into the fluid pocket using ultrasound visualization.  11,550cc, of slightly turbid fluid were aspirated and  sent for cytology, and pathology.  The needle was removed, and hemostasis was obtained with pressure.  A Band-Aid was placed.  Post procedure images did not demonstrate hemorrhage at the target site. The patient tolerated the procedure well. The patient left the department in good condition. A radiology nurse was in presence monitoring vital signs, assisting throughout the procedure.     US Guided Paracentesis    1.  Status post technically successful ultrasound-guided paracentesis.  CLINICAL HISTORY:Mayson BERNARDINI is a Male of 12 years age, referred for Ultrasound Guided Paracentesis.  PROCEDURE: Survey of the abdomen showed large amount of ascites fluid. After obtaining informed consent, the patient was positioned supine on the sonography table. Using ultrasound, the skin over the left hemiabdomen was locally anesthetized with 1% lidocaine.  Following that, a Yueh needle was advanced into the fluid pocket using ultrasound visualization.  11,200cc, of slightly turbid fluid were aspirated and sent for cytology, and pathology.  The needle was removed, and hemostasis was obtained with pressure.  A Band-Aid was placed.  Post procedure images did not demonstrate hemorrhage at the target site. The patient tolerated the procedure well. The patient left the department in good condition. A radiology nurse was in presence monitoring vital signs, assisting throughout the procedure.     [place] Chest Including Mediastinum    1.  No evidence of pleural effusion in the bilateral thoracic cavities. When compared to thoracic ultrasound dating May 27, 2018, what was seen is probable  pleural effusion was most likely extensive ascites. COMPARISON: May 27, 2018 DIAGNOSIS: Ascites and alcoholic liver cirrhosis COMMENTS: None TECHNIQUE: Limited bilateral thoracic ultrasound FINDINGS: Limited bilateral thoracic ultrasound demonstrates no evidence of pleural effusion. When compared to prior study, what was seen is pleural effusion was  most likely the extensively large ascites from the abdomen which was under the diaphragm.     [place] Chest Including Mediastinum    1.  Limited ultrasound of the bilateral thoracic cavities demonstrates bilateral moderate to large pleural effusions with some septations and loculations. COMPARISON:No prior studies are available for comparison. DIAGNOSIS: Pleural effusion COMMENTS: None TECHNIQUE: Limited ultrasound of the bilateral thoracic cavities FINDINGS: Ultrasound of the bilateral thoracic cavities demonstrates moderate to large bilateral pleural effusions. The effusions demonstrate some septations and loculations.         ASSESSMENT & PLAN  Active Hospital Problems    Diagnosis Date Noted   . Generalized abdominal pain [R10.84] 06/08/2018   . Ascites of liver [R18.8] 06/08/2018   . Visceral hyperalgesia [R19.8] 06/08/2018   . Generalized muscle ache [M79.10] 06/08/2018   . Cirrhosis [facility]) [K74.60] 06/07/2018     48 years old male who has advanced liver cirrhosis due to multiple factor including hepatitis C, B, alcohol with history of prior alcohol abuse in the past.  Patient has frequent paracentesis with the most recent few days ago, he came with rapidly accumulating fluid associated with low-grade fever, possibly/questionable peritonitis, receiving antibiotic therapy.  Patient is very nauseated, not tolerating p.o., having severe intractable pain especially in the groin which mostly accumulating the fluid he also has decompensated liver failure.     I discussed pain management, his not receiving any pain relievers the p.o. pain medication ,, I will start protocol of IV Dilaudid 0.5 to 1 mg every 3 hours.    Due to his severe abdominal distention I will be ordering 2 dose of IM Norflex, he is now tolerating p.o. baclofen at this time    I do believe patient mostly has combination of severe mechanical pain due to severe distention of the scrotal region in addition also has underlying visceral hyperalgesia which  could be due to an inflammatory process.    RECOMMENDATION:  SEE ORDERS    We will start IV pain management with 0.5 to 1 mg Dilaudid, to manage his severe intractable pain, discontinue p.o. medication as he is not tolerating and not helping  Ordering IM Norflex 60 mg every 12 hours .    Scrotal elevation to further input from either general surgery or IV about further drainage might help    Apply topical analgesics either lidocaine cream or patches      SIGNATURE: Earna Coder, MD PATIENT NAME: Travis Ortega   DATE: June 08, 2018 MRN: [id number]   TIME: 11:04 AM PAGER/CONTACT #: [phone]

## 2018-06-08 NOTE — Progress Notes (Signed)
Pharmacy Note  Vancomycin Consult    Travis Ortega is a 48 y.o. male started on Vancomycin for acute febrile illness/r/o pneumonia; consult received from Dr. Ruben Reason to manage therapy. Also receiving the following antibiotics: None.    Patient Active Problem List   Diagnosis    Recurrent incisional hernia    Cirrhosis (HCC)    Generalized abdominal pain    Ascites of liver    Visceral hyperalgesia    Generalized muscle ache       Allergies:  Nubain [nalbuphine]; Morphine; Penicillin g; and Tylenol [acetaminophen]     Temp max: 100.3    Recent Labs     06/07/18  1652 06/08/18  1409   BUN 21* 22*       Recent Labs     06/07/18  1652 06/08/18  1409   CREATININE 1.00 1.02       Recent Labs     06/07/18  1652 06/08/18  1409   WBC 5.5 3.2*         Intake/Output Summary (Last 24 hours) at 06/08/2018 1622  Last data filed at 06/08/2018 0500  Gross per 24 hour   Intake 1480 ml   Output 600 ml   Net 880 ml       Culture Date      Source                       Results  Procedure Component Value Units Date/Time   Culture, Blood 1 [[id number]]    Order Status: No result Specimen: Blood    Culture, Blood 2 [[id number]]    Order Status: No result Specimen: Blood    Culture, Blood 2 [[id number]] (Abnormal) Collected: 06/07/18 1935   Order Status: Completed Specimen: Blood Updated: 06/08/18 1610    Culture, Blood 2 --Abnormal     Gram stain aerobic bottle   Gram positive cocci in clusters-resembling Staph   1 out of 2 blood cultures   Further results to follow   Abnormal     Organism Staph aureus DNA DetectedAbnormal     Culture, Blood 2 --    The following organisms and resistance markers have been   tested using nucleic acid testing technology.   ORGANISMS:   Staphylococcus aureus, Staphylococcus epidermidis,   Staphylococcus lugdunensis,Staphylococcus coagulase-negative,   Enterococcus faecalis, Enterococcus faecium, Listeria species,   Streptococcus pneumoniae, Streptococcus pyogenes (Gp A),   Streptococcus agalactiae (Gp B),  Streptococcus anginosus gp,   Streptococcus species.   METHICILLIN RESISTANCE MARKER:   mec-A   VANCOMYCIN RESISTANCE MARKERS:   van-A   van-B  :    Narrative:     ORDER#: [id number]                          ORDERED BY: YIN, DAVID  SOURCE: Blood                              COLLECTED:  06/07/18 19:35  ANTIBIOTICS AT COLL.:                      RECEIVED :  06/07/18 19:41  CALL  [id number] tel. [phone],  Microbiology results called to and read back by Orlie Pollen RN, 06/08/2018  16:10, by Carles Collet   Gram Stain [[id number]] Collected: 06/07/18 1941   Order Status: Completed Specimen: Ascitic Fluid Updated: 06/08/18 1347  Gram Stain Result Few WBC's, No organisms seen   Narrative:     ORDER#: [id number]                          ORDERED BY: Glenna Durand, DAVID  SOURCE: Ascites Body Fluid                 COLLECTED:  06/07/18 19:41  ANTIBIOTICS AT COLL.:                      RECEIVED :  06/07/18 19:41   Culture, Body Fluid [[id number]] Collected: 06/07/18 1941   Order Status: Sent Specimen: Body Fluid from Ascitic Fluid Updated: 06/07/18 1945   Culture, Blood 1 [[id number]] Collected: 06/07/18 1903   Order Status: Sent Specimen: Blood Updated: 06/07/18 1923   Rapid Influenza A/B Antigens [[id number]] Collected: 06/07/18 1659   Order Status: Completed Specimen: Nasopharyngeal Updated: 06/07/18 1716    Influenza A by PCR Negative    Comment: Effective 12/05/17   Please note methodology and/or reference ranges have changed.        Influenza B by PCR Negative    Comment: Effective 12/05/17   Please note methodology and/or reference ranges have changed.           Ht Readings from Last 1 Encounters:   06/07/18 6\' 1"  (1.854 m)        Wt Readings from Last 1 Encounters:   06/07/18 225 lb (102.1 kg)         Body mass index is 29.69 kg/m??.    Estimated Creatinine Clearance: 115 mL/min (based on SCr of 1.02 mg/dL).    Goal Trough Level: 10-20 mcg/mL    Assessment/Plan:  Will initiate vancomycin 1,500 mg IV every 12 hours based off patient's  weight and renal function.    Obtain trough 30 minutes prior to the 4th dose, on 06/10/18 at 0415.    Thank you for the consult. Pharmacy will continue to follow.    Karn Cassis, PharmD   06/08/2018 4:22 PM

## 2018-06-08 NOTE — Consults (Signed)
Consults    Patient Name: Travis Ortega  Admit Date: 06/07/2018  4:23 PM  MR #: [id number]  DOB: May 07, 1970    Attending Physician: Pearletha Furl, DO  Reason for consult: Cirrhosis    History of Presenting Illness:      Travis Ortega is a 48 y.o. male on hospital day 1 with a history of scrotal swelling and abdominal distention.  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  He also has a history of exposure to hepatitis B and C and on reviewing his medications it seems he was on tenofovir.  However patient states he has not been taking any medications for this..  Patient was recently at [facility] clinic in ICU and he said he had EGD which reportedly showed varices.  Has not had any GI bleeding lately.  His most recent ascites fluid study shows cloudy fluid with no pleocytosis, fluid is also described as milky.  Acetic fluid protein was low albumin was not done.  Body fluid culture is pending, patient reported having some low-grade fever also.  It seems ascitic fluid has reaccumulated and patient is having abdominal discomfort.  History Obtained From:  patient      History:      Past Medical History:   Diagnosis Date   ??? Cirrhosis (HCC)    ??? Hepatitis B    ??? Hepatitis C      Past Surgical History:   Procedure Laterality Date   ??? HERNIA REPAIR     ??? HERNIA REPAIR  07/09/2016   ??? KNEE SURGERY Right     arthroscopy    ??? PARACENTESIS Left 05/28/2018    total of 11,200 cc removed per Dr Floyde Parkins   ??? PARACENTESIS Left 06/04/2018    Total of per Dr. Floyde Parkins   ??? PR LAP, INCISIONAL HERNIA REPAIR,REDUCIBLE N/A 07/09/2016    LAPAROSCOPIC INCISIONAL HERNIA REPAIR WITH MESH. POSS OPEN. performed by Loreli Slot, MD at [facility] OR       Family History  Family History   Problem Relation Age of Onset   ??? Cancer Mother      []  Unable to obtain due to ventilated and/ or neurologic status    Social History     Socioeconomic History   ??? Marital status: Single     Spouse  name: Not on file   ??? Number of children: Not on file   ??? Years of education: Not on file   ??? Highest education level: Not on file   Occupational History   ??? Not on file   Social Needs   ??? Financial resource strain: Not on file   ??? Food insecurity:     Worry: Not on file     Inability: Not on file   ??? Transportation needs:     Medical: Not on file     Non-medical: Not on file   Tobacco Use   ??? Smoking status: Current Every Day Smoker     Packs/day: 0.50     Types: Cigarettes   ??? Smokeless tobacco: Never Used   Substance and Sexual Activity   ??? Alcohol use: No   ??? Drug use: No   ??? Sexual activity: Not on file   Lifestyle   ??? Physical activity:     Days per week: Not on file     Minutes per session: Not on file   ??? Stress: Not on file  Relationships   ??? Social connections:     Talks on phone: Not on file     Gets together: Not on file     Attends religious service: Not on file     Active member of club or organization: Not on file     Attends meetings of clubs or organizations: Not on file     Relationship status: Not on file   ??? Intimate partner violence:     Fear of current or ex partner: Not on file     Emotionally abused: Not on file     Physically abused: Not on file     Forced sexual activity: Not on file   Other Topics Concern   ??? Not on file   Social History Narrative   ??? Not on file       Unable to obtain due to ventilated and/ or neurologic status      Home Medications:      Medications Prior to Admission: lactulose encephalopathy 10 GM/15ML SOLN solution, Take 20 g by mouth 3 times daily Indications: pt unsure of dose  ipratropium-albuterol (DUONEB) 0.5-2.5 (3) MG/3ML SOLN nebulizer solution, Inhale 1 vial into the lungs every 6 hours as needed for Shortness of Breath  melatonin 3 MG TABS tablet, Take 6 mg by mouth daily  omeprazole (PRILOSEC) 20 MG delayed release capsule, Take 20 mg by mouth daily  oxyCODONE (ROXICODONE) 5 MG immediate release tablet, Take 15 mg by mouth every 4 hours as needed.    spironolactone (ALDACTONE) 100 MG tablet, Take 100 mg by mouth daily  ibuprofen (ADVIL;MOTRIN) 800 MG tablet, Take 1 tablet by mouth every 6 hours as needed for Pain  triamcinolone (KENALOG) 0.025 % cream, APP EXT AA BID  polyethylene glycol (GLYCOLAX) powder, take 17GM (DISSOLVED IN WATER) by mouth once daily  ondansetron (ZOFRAN) 4 MG tablet, Take 4 mg by mouth Daily   baclofen (LIORESAL) 10 MG tablet, Take 10 mg by mouth twice a week   tenofovir (VIREAD) 300 MG tablet, TK 1 T PO Daily  furosemide (LASIX) 20 MG tablet, Take 60 mg by mouth 2 times daily     Current Hospital Medications:     Scheduled Meds:  ??? sodium chloride flush  10 mL Intravenous 2 times per day   ??? enoxaparin  40 mg Subcutaneous Daily   ??? nicotine  1 patch Transdermal Daily   ??? ciprofloxacin  400 mg Intravenous Q12H   ??? [START ON 06/09/2018] baclofen  10 mg Oral Once per day on Mon Thu   ??? furosemide  60 mg Oral BID   ??? lactulose  20 g Oral TID   ??? melatonin  6 mg Oral Daily   ??? spironolactone  100 mg Oral Daily   ??? tenofovir  300 mg Oral Daily     Continuous Infusions:  ??? sodium chloride 75 mL/hr at 06/07/18 2049   ??? pantoprozole (PROTONIX) infusion 8 mg/hr (06/08/18 0607)     PRN Meds:.sodium chloride flush, polyethylene glycol, promethazine, ondansetron, ibuprofen, oxyCODONE  .  ??? sodium chloride 75 mL/hr at 06/07/18 2049   ??? pantoprozole (PROTONIX) infusion 8 mg/hr (06/08/18 8469)        Allergies:     Allergies   Allergen Reactions   ??? Nubain [Nalbuphine] Anaphylaxis and Hives   ??? Morphine Swelling   ??? Penicillin G Swelling     Throat swells   ??? Tylenol [Acetaminophen] Rash        Review of Systems:       [  x] CV, Resp, Neuro, GU, and all other systems reviewed and negative other than listed in HPI.  ??   []  Unable to obtain due to ventilated and/ or neurologic status      Objective Findings:     Vitals:   Vitals:    06/07/18 1740 06/07/18 1836 06/07/18 2013 06/07/18 2112   BP: (!) 135/55 (!) 102/47 (!) 106/56    Pulse:  81 77 77   Resp:   16 18    Temp:   98.2 ??F (36.8 ??C)    TempSrc:   Oral    SpO2:  97% 100%    Weight:       Height:            Physical Examination:  General: In some discomfort because of ascites  HEENT: Normocephalic,  scleral icterus.  None  Neck: No jugular venous distention.  Heart: Regular, no murmur, no rub/gallop. No right ventricular heave.  Lungs: Decreased breath sounds on both bases  Abdomen: Moderate to severe ascites with tenderness in the abdomen,  Soft, tenderness ,  Bowel sounds hypoactive, scrotum appears to be significantly swollen  Extremities: No clubbing/cyanosis, 1-2+ edema  Skin: Warm, dry, normal turgor, no rash, no bruise, no petichiae.  Multiple tattoos  Neuro: No myoclonus or tremor.  No asterixis  Psych: Normal affect    Results/ Medications reviewed 06/08/2018, 7:27 AM     Laboratory, Microbiology, Pathology, Radiology, Cardiology, Medications and Transcriptions reviewed  Scheduled Meds:  ??? sodium chloride flush  10 mL Intravenous 2 times per day   ??? enoxaparin  40 mg Subcutaneous Daily   ??? nicotine  1 patch Transdermal Daily   ??? ciprofloxacin  400 mg Intravenous Q12H   ??? [START ON 06/09/2018] baclofen  10 mg Oral Once per day on Mon Thu   ??? furosemide  60 mg Oral BID   ??? lactulose  20 g Oral TID   ??? melatonin  6 mg Oral Daily   ??? spironolactone  100 mg Oral Daily   ??? tenofovir  300 mg Oral Daily     Continuous Infusions:  ??? sodium chloride 75 mL/hr at 06/07/18 2049   ??? pantoprozole (PROTONIX) infusion 8 mg/hr (06/08/18 0607)       Recent Labs     06/07/18  1652   WBC 5.5   HGB 7.4*   HCT 23.7*   MCV 70.0*   PLT 123*     Recent Labs     06/06/18  2331 06/07/18  1652   NA 122* 120*   K 4.3 5.2*   CL 91* 91*   CO2 21 19*   BUN 18 21*   CREATININE 1.07 1.00     Recent Labs     06/07/18  1652   AST 54*   ALT 23   BILITOT 0.9*   ALKPHOS 160*     Recent Labs     06/07/18  1652   LIPASE 44     Recent Labs     06/07/18  1652   PROT 6.6   INR 1.3     US Guided Paracentesis    Result Date: 06/04/2018  1.  Status post  technically successful ultrasound-guided paracentesis.  CLINICAL HISTORY:Excell SCHNEIDERS is a Male of 68 years age, referred for Ultrasound Guided Paracentesis.  PROCEDURE: Survey of the abdomen showed large amount of ascites fluid. After obtaining informed consent, the patient was positioned supine on the sonography table. Using ultrasound, the skin over the  left hemiabdomen was locally anesthetized with 1% lidocaine.  Following that, a Yueh needle was advanced into the fluid pocket using ultrasound visualization.  11,550cc, of slightly turbid fluid were aspirated and sent for cytology, and pathology.  The needle was removed, and hemostasis was obtained with pressure.  A Band-Aid was placed.  Post procedure images did not demonstrate hemorrhage at the target site. The patient tolerated the procedure well. The patient left the department in good condition. A radiology nurse was in presence monitoring vital signs, assisting throughout the procedure.     US Guided Paracentesis    Result Date: 05/29/2018  1.  Status post technically successful ultrasound-guided paracentesis.  CLINICAL HISTORY:Randale RILING is a Male of 64 years age, referred for Ultrasound Guided Paracentesis.  PROCEDURE: Survey of the abdomen showed large amount of ascites fluid. After obtaining informed consent, the patient was positioned supine on the sonography table. Using ultrasound, the skin over the left hemiabdomen was locally anesthetized with 1% lidocaine.  Following that, a Yueh needle was advanced into the fluid pocket using ultrasound visualization.  11,200cc, of slightly turbid fluid were aspirated and sent for cytology, and pathology.  The needle was removed, and hemostasis was obtained with pressure.  A Band-Aid was placed.  Post procedure images did not demonstrate hemorrhage at the target site. The patient tolerated the procedure well. The patient left the department in good condition. A radiology nurse was in presence monitoring vital  signs, assisting throughout the procedure.     [place] Chest Including Mediastinum    Result Date: 05/29/2018  1.  No evidence of pleural effusion in the bilateral thoracic cavities. When compared to thoracic ultrasound dating May 27, 2018, what was seen is probable pleural effusion was most likely extensive ascites. COMPARISON: May 27, 2018 DIAGNOSIS: Ascites and alcoholic liver cirrhosis COMMENTS: None TECHNIQUE: Limited bilateral thoracic ultrasound FINDINGS: Limited bilateral thoracic ultrasound demonstrates no evidence of pleural effusion. When compared to prior study, what was seen is pleural effusion was most likely the extensively large ascites from the abdomen which was under the diaphragm.     [place] Chest Including Mediastinum    Result Date: 05/28/2018  1.  Limited ultrasound of the bilateral thoracic cavities demonstrates bilateral moderate to large pleural effusions with some septations and loculations. COMPARISON:No prior studies are available for comparison. DIAGNOSIS: Pleural effusion COMMENTS: None TECHNIQUE: Limited ultrasound of the bilateral thoracic cavities FINDINGS: Ultrasound of the bilateral thoracic cavities demonstrates moderate to large bilateral pleural effusions. The effusions demonstrate some septations and loculations.        Impression:   48 year old male with decompensated liver disease complicated by ascites, scrotal swelling and peripheral edema.  Patient also has hyponatremia , probably this is dilutional, also has low hemoglobin hematocrit with thrombocytopenia.  Patient had a CT of the abdomen and pelvis done yesterday results are pending, would recommend therapeutic and diagnostic paracentesis and send the fluid for culture and also check triglyceride level since it was dark described as milky  Plan:   Needs to repeat paracentesis and send the fluid for culture and check triglyceride level.  It seems patient has almost refractory ascites and slightly increased potassium level  may preclude the use of Aldactone.  We will also have to consider TIPS if ascites cannot be controlled and, since patient has been followed at [facility] clinic would recommend transferring to that facility.  Comments:     Thank you for allowing [place] to participate in the care of this patient.  Will continue to follow.    Please call if questions or concerns arise.    Electronically signed by Mirian Capuchin, MD on 06/08/2018 at 7:27 AM

## 2018-06-08 NOTE — Consults (Signed)
Infectious Diseases Inpatient Consult Note      Reason for Consult:   peritonitis  Requesting Physician:   Juliann Pares, CNP  Primary Care Physician:  Yaakov Guthrie, APRN - CNP  History Obtained From:   Pt, EPIC    Admit Date: 06/07/2018  Hospital Day: 2      HISTORY OF PRESENT ILLNESS:  This is a 48 y.o. male was admitted to [facility]  from [facility]  through ER with high grade fevers, sore throat, productive cough, severe neck pain for 3 days. Was found to have large ascites. Has H/O liver cirrhosis requiring frequent paracentesis. Was started on Cipro for possible SBP. Tested negative for the Flu.    CHIEF COMPLAINT:       Past Medical History:   Diagnosis Date   ??? Cirrhosis (HCC)    ??? Hepatitis B    ??? Hepatitis C        Past Surgical History:   Procedure Laterality Date   ??? HERNIA REPAIR     ??? HERNIA REPAIR  07/09/2016   ??? KNEE SURGERY Right     arthroscopy    ??? PARACENTESIS Left 05/28/2018    total of 11,200 cc removed per Dr Floyde Parkins   ??? PARACENTESIS Left 06/04/2018    Total of per Dr. Floyde Parkins   ??? PR LAP, INCISIONAL HERNIA REPAIR,REDUCIBLE N/A 07/09/2016    LAPAROSCOPIC INCISIONAL HERNIA REPAIR WITH MESH. POSS OPEN. performed by Loreli Slot, MD at [facility] OR       Current Medications:    ??? [START ON 06/09/2018] pantoprazole  40 mg Oral QAM AC   ??? orphenadrine  60 mg Intramuscular Q12H   ??? lidocaine  3 patch Transdermal Daily   ??? levofloxacin  500 mg Intravenous Q24H   ??? sodium chloride flush  10 mL Intravenous 2 times per day   ??? enoxaparin  40 mg Subcutaneous Daily   ??? nicotine  1 patch Transdermal Daily   ??? furosemide  60 mg Oral BID   ??? lactulose  20 g Oral TID   ??? melatonin  6 mg Oral Daily   ??? tenofovir  300 mg Oral Daily       Allergies:  Nubain [nalbuphine]; Morphine; Penicillin g; and Tylenol [acetaminophen]    Social History     Socioeconomic History   ??? Marital status: Single     Spouse name: Not on file   ??? Number of children: Not on file   ??? Years of education: Not on file   ??? Highest education level: Not  on file   Occupational History   ??? Not on file   Social Needs   ??? Financial resource strain: Not on file   ??? Food insecurity:     Worry: Not on file     Inability: Not on file   ??? Transportation needs:     Medical: Not on file     Non-medical: Not on file   Tobacco Use   ??? Smoking status: Current Every Day Smoker     Packs/day: 0.50     Types: Cigarettes   ??? Smokeless tobacco: Never Used   Substance and Sexual Activity   ??? Alcohol use: No   ??? Drug use: No   ??? Sexual activity: Not on file   Lifestyle   ??? Physical activity:     Days per week: Not on file     Minutes per session: Not on file   ??? Stress: Not on  file   Relationships   ??? Social connections:     Talks on phone: Not on file     Gets together: Not on file     Attends religious service: Not on file     Active member of club or organization: Not on file     Attends meetings of clubs or organizations: Not on file     Relationship status: Not on file   ??? Intimate partner violence:     Fear of current or ex partner: Not on file     Emotionally abused: Not on file     Physically abused: Not on file     Forced sexual activity: Not on file   Other Topics Concern   ??? Not on file   Social History Narrative   ??? Not on file         Family History:   Family History   Problem Relation Age of Onset   ??? Cancer Mother        Review of Systems  14 system review is negative other than HPI    Physical Exam  Vitals:    06/07/18 1836 06/07/18 2013 06/07/18 2112 06/08/18 0830   BP: (!) 102/47 (!) 106/56  119/69   Pulse: 81 77 77 69   Resp: Temp:  98.2 ??F (36.8 ??C)  98.1 ??F (36.7 ??C)   TempSrc:  Oral  Oral   SpO2: 97% 100%  99%   Weight:       Height:         General Appearance: alert and oriented to person, place and time, well-developed and well-nourished, in no acute distress  Skin: warm and dry, no rash.   Head: normocephalic and atraumatic  Eyes: extraocular eye movements intact, conjunctivae normal, icteric sclerae  ENT: oropharynx clear and moist with normal  mucous membranes. No thrush  Lungs: normal respiratory effort, Clear Lungs, no rhonchi, no crackles, no wheezes  Heart:RRR, nl S1/S2, no murmur  Abdomen: soft, no tenderness, distended with large amount of ascites, + BS  NEUROLOGICAL: alert and oriented x 3, no focal deficits  +2 B leg edema  No erythema, no warmth, no tenderness        DATA:    Lab Results   Component Value Date    WBC 5.5 06/07/2018    HGB 7.4 (L) 06/07/2018    HCT 23.7 (L) 06/07/2018    MCV 70.0 (L) 06/07/2018    PLT 123 (L) 06/07/2018     Lab Results   Component Value Date    CREATININE 1.00 06/07/2018    BUN 21 (H) 06/07/2018    NA 120 (LL) 06/07/2018    K 5.2 (H) 06/07/2018    CL 91 (L) 06/07/2018    CO2 19 (L) 06/07/2018       Hepatic Function Panel:   Lab Results   Component Value Date    ALKPHOS 160 06/07/2018    ALT 23 06/07/2018    AST 54 06/07/2018    PROT 6.6 06/07/2018    BILITOT 0.9 06/07/2018    LABALBU 2.7 06/07/2018    LABALBU 3.1 05/31/2016       Imaging:   Impression:   1. Findings consistent with decompensated cirrhosis with large volume intraperitoneal ascites, a shrunken liver with nodular contour, splenomegaly and esophageal variceal formation.  2. No evidence of bowel obstruction.  3. There is a large fluid-filled right inguinal hernia with large amounts of fluid  extending within the scrotum.     Body Fluid Cell Count with Differential [[id number]] Collected: 06/07/18 1941 Updated: 06/08/18 0553 Specimen Source: Ascitic Fluid Cell Count Fluid Type Peritoneal Fl. Color, Fluid Milky Appearance, Fluid Cloudy Clot Eval. see below   Comment: No Clots Seen   Nucl Cell, Fluid 26 /cumm RBC, Fluid 392 /cumm Neutrophil Count, Fluid 1 % Lymphocytes, Body Fluid 81 % Monocyte Count, Fluid 18 % Number of Cells Counted Fluid         IMPRESSION:    ?? Acute febrile illness with Flu like illness R/O pneumonia  ?? Decompensated liver cirrhosis with recurrent ascites, no evidence for bacterial peritonitis    Patient Active Problem List    Diagnosis   ??? Recurrent incisional hernia   ??? Cirrhosis (HCC)   ??? Generalized abdominal pain   ??? Ascites of liver   ??? Visceral hyperalgesia   ??? Generalized muscle ache       PLAN:  ?? Change Cipro to Levaquin  ?? CXR  ?? Check Blood CX  ?? GI following cirrhosis and chronic Hepatitis    Discussed with patient    Eudelia Bunch, MD

## 2018-06-08 NOTE — Plan of Care (Signed)
Continue current POC.

## 2018-06-09 ENCOUNTER — Inpatient Hospital Stay: Admit: 2018-06-09 | Payer: MEDICARE | Primary: Family

## 2018-06-09 LAB — CBC WITH AUTO DIFFERENTIAL
Basophils %: 0.9 %
Basophils Absolute: 0 10*3/uL (ref 0.0–0.2)
Eosinophils %: 3.3 %
Eosinophils Absolute: 0.1 10*3/uL (ref 0.0–0.7)
Hematocrit: 23.8 % — ABNORMAL LOW (ref 42.0–52.0)
Hemoglobin: 7.6 g/dL — ABNORMAL LOW (ref 14.0–18.0)
Lymphocytes %: 29.4 %
Lymphocytes Absolute: 0.7 10*3/uL — ABNORMAL LOW (ref 1.0–4.8)
MCH: 22 pg — ABNORMAL LOW (ref 27.0–31.3)
MCHC: 31.7 % — ABNORMAL LOW (ref 33.0–37.0)
MCV: 69.5 fL — ABNORMAL LOW (ref 80.0–100.0)
Monocytes %: 20.6 %
Monocytes Absolute: 0.5 10*3/uL (ref 0.2–0.8)
Neutrophils %: 45.8 %
Neutrophils Absolute: 1 10*3/uL — ABNORMAL LOW (ref 1.4–6.5)
Platelets: 121 10*3/uL — ABNORMAL LOW (ref 130–400)
RBC: 3.43 M/uL — ABNORMAL LOW (ref 4.70–6.10)
RDW: 22.8 % — ABNORMAL HIGH (ref 11.5–14.5)
WBC: 2.3 10*3/uL — ABNORMAL LOW (ref 4.8–10.8)

## 2018-06-09 LAB — COMPREHENSIVE METABOLIC PANEL W/ REFLEX TO MG FOR LOW K
ALT: 26 U/L (ref 0–41)
AST: 56 U/L — ABNORMAL HIGH (ref 0–40)
Albumin: 2.5 g/dL — ABNORMAL LOW (ref 3.5–4.6)
Alkaline Phosphatase: 143 U/L — ABNORMAL HIGH (ref 35–104)
Anion Gap: 13 mEq/L (ref 9–15)
BUN: 20 mg/dL (ref 6–20)
CO2: 21 mEq/L (ref 20–31)
Calcium: 8.1 mg/dL — ABNORMAL LOW (ref 8.5–9.9)
Chloride: 96 mEq/L (ref 95–107)
Creatinine: 1.01 mg/dL (ref 0.70–1.20)
GFR African American: >60 (ref >60)
GFR Non-African American: >60 (ref >60)
Globulin: 4 g/dL — ABNORMAL HIGH (ref 2.3–3.5)
Glucose: 121 mg/dL — ABNORMAL HIGH (ref 70–99)
Potassium reflex Magnesium: 4.1 mEq/L (ref 3.4–4.9)
Sodium: 130 mEq/L — ABNORMAL LOW (ref 135–144)
Total Bilirubin: 0.5 mg/dL (ref 0.2–0.7)
Total Protein: 6.5 g/dL (ref 6.3–8.0)

## 2018-06-09 LAB — GLUCOSE, BODY FLUID: Glucose, Fluid: 125.3 mg/dL

## 2018-06-09 LAB — CELL COUNT WITH DIFFERENTIAL, BODY FLUID
Lymphocytes, Body Fluid: 85 %
Monocyte Count, Fluid: 13 %
Neutrophil Count, Fluid: 2 %
Nucl Cell, Fluid: 42 /mm3
Number of Cells Counted Fluid: 100
RBC, Fluid: 428 /mm3

## 2018-06-09 LAB — AMYLASE, BODY FLUID: Amylase, Fluid: 19 U/L

## 2018-06-09 LAB — GRAM STAIN

## 2018-06-09 LAB — HEMOGLOBIN AND HEMATOCRIT
Hematocrit: 25.6 % — ABNORMAL LOW (ref 42.0–52.0)
Hemoglobin: 8 g/dL — ABNORMAL LOW (ref 14.0–18.0)

## 2018-06-09 LAB — ALBUMIN, FLUID: Albumin, Fluid: <0.2 g/dL

## 2018-06-09 LAB — PROTEIN, BODY FLUID: Protein, Fluid: 0.8 g/dL

## 2018-06-09 LAB — PATH CONSULT HEMATOLOGY

## 2018-06-09 LAB — LACTATE DEHYDROGENASE, BODY FLUID: LD, Fluid: 43 U/L

## 2018-06-09 MED ORDER — TRAMADOL HCL 50 MG PO TABS
50 MG | Freq: Four times a day (QID) | ORAL | Status: DC | PRN
Start: 2018-06-09 — End: 2018-06-11

## 2018-06-09 MED ORDER — MELATONIN 1 MG PO TABS
1 MG | Freq: Every day | ORAL | Status: DC
Start: 2018-06-09 — End: 2018-06-16
  Administered 2018-06-10 – 2018-06-16 (×7): 10 mg via ORAL

## 2018-06-09 MED ORDER — HYDROXYZINE HCL 25 MG PO TABS
25 MG | Freq: Four times a day (QID) | ORAL | Status: DC | PRN
Start: 2018-06-09 — End: 2018-06-16
  Administered 2018-06-09 – 2018-06-10 (×3): 25 mg via ORAL

## 2018-06-09 MED ORDER — HYDROMORPHONE HCL 1 MG/ML IJ SOLN
1 MG/ML | INTRAMUSCULAR | Status: DC | PRN
Start: 2018-06-09 — End: 2018-06-11
  Administered 2018-06-09 – 2018-06-11 (×11): 0.5 mg via INTRAVENOUS

## 2018-06-09 MED ORDER — SPIRONOLACTONE 50 MG PO TABS
50 MG | Freq: Every day | ORAL | Status: DC
Start: 2018-06-09 — End: 2018-06-14
  Administered 2018-06-10 – 2018-06-12 (×4): 100 mg via ORAL

## 2018-06-09 MED ORDER — ALBUMIN HUMAN 25 % IV SOLN
25 % | Freq: Once | INTRAVENOUS | Status: AC | PRN
Start: 2018-06-09 — End: 2018-06-09
  Administered 2018-06-09: 17:00:00 50 g via INTRAVENOUS

## 2018-06-09 MED ORDER — ORPHENADRINE CITRATE ER 100 MG PO TB12
100 MG | Freq: Two times a day (BID) | ORAL | Status: DC
Start: 2018-06-09 — End: 2018-06-11
  Administered 2018-06-10 – 2018-06-11 (×4): 100 mg via ORAL

## 2018-06-09 MED FILL — HYDROMORPHONE HCL 1 MG/ML IJ SOLN: 1 mg/mL | INTRAMUSCULAR | Qty: 1

## 2018-06-09 MED FILL — FUROSEMIDE 20 MG PO TABS: 20 mg | ORAL | Qty: 1

## 2018-06-09 MED FILL — ALBUTEIN 25 % IV SOLN: 25 % | INTRAVENOUS | Qty: 100

## 2018-06-09 MED FILL — DILAUDID 1 MG/ML IJ SOLN: 1 mg/mL | INTRAMUSCULAR | Qty: 0.5

## 2018-06-09 MED FILL — SPIRONOLACTONE 50 MG PO TABS: 50 mg | ORAL | Qty: 2

## 2018-06-09 MED FILL — ORPHENADRINE CITRATE 30 MG/ML IJ SOLN: 30 mg/mL | INTRAMUSCULAR | Qty: 2

## 2018-06-09 MED FILL — LACTULOSE 10 GM/15ML PO SOLN: 10 GM/15ML | ORAL | Qty: 30

## 2018-06-09 MED FILL — HYDROXYZINE HCL 25 MG PO TABS: 25 mg | ORAL | Qty: 1

## 2018-06-09 MED FILL — VANCOMYCIN HCL 5 G IV SOLR: 5 g | INTRAVENOUS | Qty: 1500

## 2018-06-09 MED FILL — VANCOMYCIN HCL 1 G IV SOLR: 1 g | INTRAVENOUS | Qty: 1500

## 2018-06-09 MED FILL — PANTOPRAZOLE SODIUM 40 MG PO TBEC: 40 mg | ORAL | Qty: 1

## 2018-06-09 MED FILL — ONDANSETRON HCL 4 MG/2ML IJ SOLN: 4 MG/2ML | INTRAMUSCULAR | Qty: 2

## 2018-06-09 MED FILL — NICOTINE 21 MG/24HR TD PT24: 21 mg/(24.h) | TRANSDERMAL | Qty: 1

## 2018-06-09 MED FILL — LIDOCAINE PAIN RELIEF 4 % EX PTCH: 4 % | CUTANEOUS | Qty: 3

## 2018-06-09 MED FILL — MELATONIN 3 MG PO TABS: 3 mg | ORAL | Qty: 2

## 2018-06-09 MED FILL — LOVENOX 40 MG/0.4ML SC SOLN: 40 MG/0.4ML | SUBCUTANEOUS | Qty: 0.4

## 2018-06-09 NOTE — Progress Notes (Signed)
Physician Progress Note    06/09/2018   2:00 PM    Name:  Travis Ortega  MRN:    [id number]     IP Day: 2     Admit Date: 06/07/2018  4:23 PM  PCP: Yaakov Guthrie, APRN - CNP    Code Status:  Full Code    Subjective:     He had paracentesis this morning with 9 L drained and is receiving albumin right now.    He reports 1 episode of vomiting this morning.  He also admits to persistent abdominal pain.  Denies chest pain, dyspnea, change in bowels or urination.    He tells me he is suffered from cirrhosis for the last 1.5 years and believes it is due to alcohol use disorder.  He was a heavy, daily drinker until 2006.  He denies ever doing IV drugs.    Current Facility-Administered Medications   Medication Dose Route Frequency Provider Last Rate Last Dose   ??? traMADol (ULTRAM) tablet 50 mg  50 mg Oral Q6H PRN Earna Coder, MD       ??? orphenadrine (NORFLEX) extended release tablet 100 mg  100 mg Oral BID Earna Coder, MD       ??? HYDROmorphone (DILAUDID) injection 0.5 mg  0.5 mg Intravenous Q4H PRN Earna Coder, MD       ??? pantoprazole (PROTONIX) tablet 40 mg  40 mg Oral QAM AC Yazid R Hussein, DO   40 mg at 06/09/18 5726   ??? lidocaine 4 % external patch 3 patch  3 patch Transdermal Daily Earna Coder, MD   3 patch at 06/09/18 0907   ??? levofloxacin (LEVAQUIN) 500 MG/100ML infusion 500 mg  500 mg Intravenous Q24H Eudelia Bunch, MD   Stopped at 06/08/18 1600   ??? vancomycin (VANCOCIN) intermittent dosing [employer]isure [employer] manager)   Other RX Placeholder Eudelia Bunch, MD       ??? vancomycin (VANCOCIN) 1,500 mg in dextrose 5 % 500 mL IVPB  1,500 mg Intravenous Q12H Glenis Smoker Abbud, MD   Stopped at 06/09/18 [phone]   ??? sodium chloride flush 0.9 % injection 10 mL  10 mL Intravenous 2 times per day Rose Fillers, APRN - CNP   10 mL at 06/08/18 2054   ??? sodium chloride flush 0.9 % injection 10 mL  10 mL Intravenous PRN Rose Fillers, APRN - CNP       ??? polyethylene glycol (GLYCOLAX) packet 17 g  17 g Oral Daily PRN Rose Fillers, APRN - CNP       ???  promethazine (PHENERGAN) tablet 12.5 mg  12.5 mg Oral Q6H PRN Rose Fillers, APRN - CNP       ??? ondansetron (ZOFRAN) injection 4 mg  4 mg Intravenous Q6H PRN Rose Fillers, APRN - CNP   4 mg at 06/09/18 0915   ??? enoxaparin (LOVENOX) injection 40 mg  40 mg Subcutaneous Daily Rose Fillers, APRN - CNP   40 mg at 06/08/18 2054   ??? nicotine (NICODERM CQ) 21 MG/24HR 1 patch  1 patch Transdermal Daily Rose Fillers, APRN - CNP   1 patch at 06/09/18 5974   ??? furosemide (LASIX) tablet 60 mg  60 mg Oral BID Rose Fillers, APRN - CNP   60 mg at 06/09/18 1638   ??? lactulose (CHRONULAC) 10 GM/15ML solution 20 g  20 g Oral TID Rose Fillers, APRN - CNP   20 g at 06/09/18 4536   ??? melatonin tablet 6  mg  6 mg Oral Daily Rose Fillers, APRN - CNP   6 mg at 06/08/18 2054   ??? tenofovir (VIREAD) tablet 300 mg  300 mg Oral Daily Rose Fillers, APRN - CNP           Physical Examination:      Vitals:  BP 101/65    Pulse 68    Temp 97.1 ??F (36.2 ??C) (Oral)    Resp 18    Ht 6\' 1"  (1.854 m)    Wt 225 lb (102.1 kg)    SpO2 100%    BMI 29.69 kg/m??   Temp (24hrs), Avg:98.1 ??F (36.7 ??C), Min:97.1 ??F (36.2 ??C), Max:99.1 ??F (37.3 ??C)    General appearance: alert, cooperative and no distress  Mental Status: oriented to person, place and time and normal affect  Lungs: clear to auscultation bilaterally, normal effort  Heart: regular rate and rhythm, no murmur  Abdomen: Diffuse tenderness to palpation.  Soft.  Bowel sounds present.  Extremities: 1+ pitting edema bilaterally  Skin: no gross lesions, rashes    Data:     Labs:  Recent Labs     06/08/18  1409 06/08/18  2326 06/09/18  0611   WBC 3.2*  --  2.3*   HGB 8.3* 8.0* 7.6*   PLT 120*  --  121*     Recent Labs     06/08/18  1409 06/09/18  0611   NA 127* 130*   K 4.6 4.1   CL 97 96   CO2 20 21   BUN 22* 20   CREATININE 1.02 1.01   GLUCOSE 146* 121*     Recent Labs     06/08/18  1409 06/09/18  0611   AST 51* 56*   ALT 25 26   BILITOT 0.5 0.5   ALKPHOS 157* 143*       Assessment and Plan:        48 year old male with  cirrhosis (Hep B/C, and h/o heavy etOH use) presented with worsening abdominal pain and scrotal swelling.    1.  Decompensated liver cirrhosis with large volume ascites and significant abdominal pain.  Possible trigger MSSA infection  -follow ascitic studies  -Diuretics per GI  -Continue lactulose  -TIPS evaluation at [facility]  -Pain management following    2.  MSSA bacteremia: 1 out of 2 positive.  Source unclear.  -Management per infectious disease.  On Abx. Repeat cultures pending    3.  Microcytic anemia: Continue PPI.  Check iron studies    Diet: DIET GENERAL; Low Sodium (2 GM)  Ppx: lovenox  Full Code    Dispo: [facility] when medically stable    Electronically signed by Lowry Bowl, DO on 06/09/2018 at 2:00 PM

## 2018-06-09 NOTE — Progress Notes (Signed)
Pt arrived via cart to ultrasound for paracentesis.

## 2018-06-09 NOTE — Progress Notes (Signed)
Travis Ortega is a 48 y.o. male patient.    Current Facility-Administered Medications   Medication Dose Route Frequency Provider Last Rate Last Dose   . traMADol (ULTRAM) tablet 50 mg  50 mg Oral Q6H PRN Earna Coder, MD       . orphenadrine (NORFLEX) extended release tablet 100 mg  100 mg Oral BID Earna Coder, MD       . HYDROmorphone (DILAUDID) injection 0.5 mg  0.5 mg Intravenous Q4H PRN Earna Coder, MD   0.5 mg at 06/09/18 1616   . melatonin tablet 10 mg  10 mg Oral Daily Lowry Bowl, DO       . hydrOXYzine (ATARAX) tablet 25 mg  25 mg Oral Q6H PRN Lowry Bowl, DO       . pantoprazole (PROTONIX) tablet 40 mg  40 mg Oral QAM AC Yazid R Hussein, DO   40 mg at 06/09/18 0558   . lidocaine 4 % external patch 3 patch  3 patch Transdermal Daily Earna Coder, MD   3 patch at 06/09/18 0907   . vancomycin (VANCOCIN) intermittent dosing Leisure centre manager)   Other RX Placeholder Rita A Abbud, MD       . vancomycin (VANCOCIN) 1,500 mg in dextrose 5 % 500 mL IVPB  1,500 mg Intravenous Q12H Rita A Abbud, MD 250 mL/hr at 06/09/18 1718 1,500 mg at 06/09/18 1718   . sodium chloride flush 0.9 % injection 10 mL  10 mL Intravenous 2 times per day Rose Fillers, APRN - CNP   10 mL at 06/08/18 2054   . sodium chloride flush 0.9 % injection 10 mL  10 mL Intravenous PRN Rose Fillers, APRN - CNP       . polyethylene glycol (GLYCOLAX) packet 17 g  17 g Oral Daily PRN Rose Fillers, APRN - CNP       . promethazine (PHENERGAN) tablet 12.5 mg  12.5 mg Oral Q6H PRN Rose Fillers, APRN - CNP       . ondansetron (ZOFRAN) injection 4 mg  4 mg Intravenous Q6H PRN Rose Fillers, APRN - CNP   4 mg at 06/09/18 0915   . enoxaparin (LOVENOX) injection 40 mg  40 mg Subcutaneous Daily Rose Fillers, APRN - CNP   40 mg at 06/08/18 2054   . nicotine (NICODERM CQ) 21 MG/24HR 1 patch  1 patch Transdermal Daily Rose Fillers, APRN - CNP   1 patch at 06/09/18 7829   . furosemide (LASIX) tablet 60 mg  60 mg Oral BID Rose Fillers, APRN - CNP   60 mg at 06/09/18 0907    . lactulose (CHRONULAC) 10 GM/15ML solution 20 g  20 g Oral TID Rose Fillers, APRN - CNP   20 g at 06/09/18 0907   . tenofovir (VIREAD) tablet 300 mg  300 mg Oral Daily Rose Fillers, APRN - CNP         Allergies   Allergen Reactions   . Nubain [Nalbuphine] Anaphylaxis and Hives   . Morphine Swelling   . Penicillin G Swelling     Throat swells   . Tylenol [Acetaminophen] Rash     Active Problems:    Cirrhosis (HCC)    Generalized abdominal pain    Ascites of liver    Visceral hyperalgesia    Generalized muscle ache  Resolved Problems:    * No resolved hospital problems. *    Blood pressure 101/65, pulse 68, temperature 97.1 F (36.2 C), temperature source Oral,  resp. rate 18, height 6\' 1"  (1.854 m), weight 225 lb (102.1 kg), SpO2 100 %.    Subjective some improvement in abdominal pain after paracentesis.  Objective fluid results noted.  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  Patient has MSSA and is on antibiotics as per ID.  Serum creatinine is normal  Assessment & Plan decompensated cirrhosis with possible sepsis.  Would start the patient on Aldactone 100 mg a day and Lasix 40 mg a day.    Mirian Capuchin, MD  06/09/2018

## 2018-06-09 NOTE — Progress Notes (Signed)
[facility] OCCUPATIONAL THERAPY EVALUATION - ACUTE     NAME: Travis Ortega  DOB: 1970-12-22 (48 y.o.)  MRN: [id number]  CODE STATUS: Full Code  Room: Y659/D357-01    Date of Service: 06/09/2018    Patient Diagnosis(es): Cirrhosis [facility]) [K74.60]   Chief Complaint   Patient presents with   ??? Groin Pain     neck and back pain, emisis      Patient Active Problem List    Diagnosis Date Noted   ??? Generalized abdominal pain 06/08/2018   ??? Ascites of liver 06/08/2018   ??? Visceral hyperalgesia 06/08/2018   ??? Generalized muscle ache 06/08/2018   ??? Cirrhosis (HCC) 06/07/2018   ??? Recurrent incisional hernia         Past Medical History:   Diagnosis Date   ??? Cirrhosis (HCC)    ??? Hepatitis B    ??? Hepatitis C      Past Surgical History:   Procedure Laterality Date   ??? HERNIA REPAIR     ??? HERNIA REPAIR  07/09/2016   ??? KNEE SURGERY Right     arthroscopy    ??? PARACENTESIS Left 05/28/2018    total of 11,200 cc removed per Dr Floyde Parkins   ??? PARACENTESIS Left 06/04/2018    Total of per Dr. Floyde Parkins   ??? PR LAP, INCISIONAL HERNIA REPAIR,REDUCIBLE N/A 07/09/2016    LAPAROSCOPIC INCISIONAL HERNIA REPAIR WITH MESH. POSS OPEN. performed by Loreli Slot, MD at [facility] OR        Restrictions  Restrictions/Precautions: Fall Risk(Medium risk)     Safety Devices: Safety Devices  Safety Devices in place: Not Applicable(Left on cart to procedure)    Subjective  Pre Treatment Pain Screening  Comments / Details: pt. reports nausea vs. pain    Pain Reassessment:   Pain Assessment  Patient Currently in Pain: Denies(Reports nausea, states nurse just gave him medication)       Prior Level of Function:  Social/Functional History  Lives With: Family(Sister)  Type of Home: House  Home Layout: One level  Home Access: Level entry  Bathroom Shower/Tub: Cabin crew: Buyer, retail: Rolling walker  ADL Assistance: Independent  Homemaking Assistance: Independent  Ambulation Assistance: Independent  Transfer Assistance:  Independent  Additional Comments: Pt. has been at [facility]1 [facility] for rehab and plans to return to continue rehab    OBJECTIVE:     Orientation Status:  Orientation  Overall Orientation Status: Within Functional Limits    Observation:  Observation/Palpation  Posture: Good  Observation: Significantly distended abdomen    Cognition Status:  Cognition  Overall Cognitive Status: WFL    Perception Status:  Perception  Overall Perceptual Status: WFL    Sensation Status:  Sensation  Overall Sensation Status: WFL    Vision and Hearing Status:  Vision  Vision: Within Functional Limits  Hearing  Hearing: Within functional limits     ROM:   LUE AROM (degrees)  LUE AROM : WFL  Left Hand AROM (degrees)  Left Hand AROM: WFL  RUE AROM (degrees)  RUE AROM : WFL  Right Hand AROM (degrees)  Right Hand AROM: WFL    Strength:  LUE Strength  Gross LUE Strength: WFL  L Hand General: 4/5  LUE Strength Comment: 4/5 all planes  RUE Strength  Gross RUE Strength: WFL  R Hand General: 4/5  RUE Strength Comment: 4/5 all planes    Coordination, Tone, Quality of Movement:   Tone  RUE  RUE Tone: Normotonic  Tone LUE  LUE Tone: Normotonic  Coordination  Movements Are Fluid And Coordinated: Yes    Hand Dominance:  Hand Dominance  Hand Dominance: Right    ADL Status:  ADL  Feeding: Independent  Grooming: Supervision  UE Bathing: Supervision  LE Bathing: Minimal assistance  UE Dressing: Setup  LE Dressing: Minimal assistance  Toileting: Stand by assistance  Additional Comments: Simulated ADLs as above. Pt. very slow moving and significantly limited primarily due to discomfort from swelling in scrotum/distended abdomen  Toilet Transfers  Toilet Transfer: Unable to assess  Toilet Transfers Comments: Pt. declines need, states he has been independent with toileting       Therapy key for assistance levels -   Independent = Pt. is able to perform task with no assistance but may require a device   Stand by assistance = Pt. does not perform task at an  independent level but does not need physical assistance, requires verbal cues  Minimal, Moderate, Maximal Assistance = Pt. requires physical assistance (25%, 50%, 75% assist from helper) for task but is able to actively participate in task   Dependent = Pt. requires total assistance with task and is not able to actively participate with task completion     Functional Mobility:  Functional Mobility  Functional - Mobility Device: No device  Activity: Other  Assist Level: Supervision  Functional Mobility Comments: Supervision to ambulate across room to transport  Transfers  Sit to stand: Modified independent  Stand to sit: Modified independent    Bed Mobility  Bed mobility  Sit to Supine: Modified independent  Comment: Up in chair at start of eval    Seated and Standing Balance:  Balance  Sitting Balance: Independent  Standing Balance: Supervision    Functional Endurance:  Activity Tolerance  Activity Tolerance: Patient Tolerated treatment well    D/C Recommendations:  OT D/C RECOMMENDATIONS  REQUIRES OT FOLLOW UP: Yes    Equipment Recommendations:  OT Equipment Recommendations  Other: Continue to assess    OT Education:   OT Education  OT Education: OT Role, Plan of Care  Patient Education: Educated pt. on role of acute care OT  Barriers to Learning: None    OT Follow Up:  OT D/C RECOMMENDATIONS  REQUIRES OT FOLLOW UP: Yes       Assessment/Discharge Disposition:  Assessment: Pt. is a 48 year old man from home, lately from SNF, who presents to [facility] with the above deficits which impact his ability to perform ADLs and IADLs. Pt. would benefit from continued OT to maximize independence and safety with ADL tasks.   Performance deficits / Impairments: Decreased functional mobility , Decreased ADL status, Decreased balance, Decreased endurance, Decreased high-level IADLs  Prognosis: Good  Discharge Recommendations: Continue to assess pending progress  Decision Making: Medium Complexity  History: Pt's medical history is  moderately complwx  Exam: Pt. has 5 performance deficits  Assistance / Modification: Pt. requires min A    Six Click Score   How much help for putting on and taking off regular lower body clothing?: A Little  How much help for Bathing?: A Little  How much help for Toileting?: A Little  How much help for putting on and taking off regular upper body clothing?: A Little  How much help for taking care of personal grooming?: None  How much help for eating meals?: None  AM-PAC Inpatient Daily Activity Raw Score: 20  AM-PAC Inpatient ADL T-Scale Score : 42.03  ADL Inpatient CMS 0-100% Score: 38.32    Plan:  Plan  Times per week: 1-3x  Plan weeks: Length of acute stay  Current Treatment Recommendations: Location manager, Building services engineer, Teaching laboratory technician, Equities trader, Mining engineer, Education, Sports administrator, Pain Management, Self-Care / ADL, Home Management Training    Goals:   Patient will:    - Improve functional endurance to tolerate/complete 60 mins of ADL's  - Be Independent in UB ADLs   - Be Independent in LB ADLs  - Be Independent in ADL transfers without LOB  - Be Independent in toileting tasks  - Access appropriate D/C site with as few architectural barriers as possible.  - Sequence self-care tasks with no verbal cues for technique    Patient Goal: Patient goals : "I want to get stronger"     Discussed and agreed upon: Yes Comments:     Therapy Time:   OT Individual Minutes  Time In: 0920  Time Out: 0930  Minutes: 10    Eval: 10 minutes     Electronically signed by:    Lynden Ang, OTR/L  06/09/2018, 10:15 AM

## 2018-06-09 NOTE — Care Coordination-Inpatient (Addendum)
Pt is currently not available. Report received that he is from [facility].  Per Thayer Headings at [facility], pt can return skilled.  However, he is almost into his co-pay days and does not have a secondary insurance.  Phone call to Almyra Free in HELP to find if pt will be screened for Medicaid.     [facility] Case Management Initial Discharge Assessment    Met with patient to discuss discharge plan.        PCP: Dillard Essex, APRN - CNP                                Date of Last Visit: Unknown    If no PCP, list provided? N/A    Discharge Planning    Living Arrangements: independently at home but currently at SNF    Who do you live with? Sister    Who helps you with your care:  self    If lives at home:     Do you have any barriers navigating in your home? no    Patient can perform ADL?  Yes    Current Services (outpatient and in home) :  From SNF ([facility])    Dialysis: No    Is transportation available to get to your appointments? Yes    DME Equipment:  yes - Walker    Respiratory equipment: None    Respiratory provider:  no     Pharmacy:  yes - [facility] with Medication Assistance Program?  No      Patient agreeable to [facility]?      N/A    Patient agreeable to SNF/Rehab?     Yes, [facility]    Other discharge needs identified?      N/A    Freedom of choice list provided with basic dialogue that supports the patient's individualized plan of care/goals and shares the quality data associated with the providers.    Yes    Does Patient Have a High-Risk for Readmission Diagnosis (CHF, PN, MI, COPD)? No    Initial Discharge Plan? (Note: please see concurrent daily documentation for any updates after initial note).    Return to [facility]    The Patient and/or patient representative: patient was provided with choice of any post-acute providers for care and equipment and agrees with discharge plan  Yes    Electronically signed by Everardo All, LSW on 06/09/2018 at 2:57 PM

## 2018-06-09 NOTE — Progress Notes (Signed)
NO SEDATION      Pt arrived to U/S room 3 via 9:50AM. HeidiU/S tech obtaining images using U/S. Pt offered reassurance and VSS. Pt denies pain at this time. Consent signed.     Timeout completed.at 10:07AM       Using U/S, Dr. Floyde Parkins marked LLQ and area cleansed with large tinted chloraprep. Once dry, using sterile technique, Dr. Floyde Parkins prepped and draped area.     Using U/S guidance, Dr. Floyde Parkins numbed site with 2% lidocaine 10cc     Using U/S guidance, access obtained with One1step centesis 67F catheter with return of cloudy thick yellow  fluid. Catheter tubing connected to suction canisters and draining well. Pt tolerating well. Specimens of fluid sent to lab.  10:44AM patient turned to left side to facilitate drainage of ascites.   10:55AM Drainage complete. Total removed.  L Travis Demont RN, removed centesis catheter and digital pressure held to site for 10 minutes.  Pressure dressing applied due to continuous oozing of bloody fluid.     LLQ site soft, no drainage, pressure dressing dry and intact  Pt will return to floor; report called.Marland Kitchen

## 2018-06-09 NOTE — Progress Notes (Signed)
PROGRESS NOTE -PAIN MANAGEMENT     SERVICE DATE:  06/09/2018   SERVICE TIME:  12:55 PM    CHIEFCOMPLAINT: Generalized abdominal pain      SUBJECTIVE:  [name] is a 48 y.o. male who presentedfor severe rapidly accumulating ascites associated with groin swelling and severe scrotal pain.    Patient has end-stage liver disease requiring frequent paracentesis, he is awaiting culture for the results.    He has a drainage today was paracentesis resulted 9 L, has been feeling much better since.  Is also started on antibiotic therapy, seen by ID for ruling out peritonitis.    Patient states  pain is much better controlled and relieved with pain medication, muscle relaxant, as well as after the paracentesis    PAIN  ASSESSMENT:    intermittent    aching and throbbing    pain is perceived as moderate (4-6 pain scale)      MEDICATIONS:    Current Facility-Administered Medications   Medication Dose Route Frequency Provider Last Rate Last Dose   . traMADol (ULTRAM) tablet 50 mg  50 mg Oral Q6H PRN Earna CoderSameh R Elesha Thedford, MD       . orphenadrine (NORFLEX) extended release tablet 100 mg  100 mg Oral BID Earna CoderSameh R Major Santerre, MD       . HYDROmorphone (DILAUDID) injection 0.5 mg  0.5 mg Intravenous Q4H PRN Earna CoderSameh R Cullen Lahaie, MD       . pantoprazole (PROTONIX) tablet 40 mg  40 mg Oral QAM AC Yazid R Hussein, DO   40 mg at 06/09/18 0558   . lidocaine 4 % external patch 3 patch  3 patch Transdermal Daily Earna CoderSameh R Kato Wieczorek, MD   3 patch at 06/09/18 0907   . levofloxacin (LEVAQUIN) 500 MG/100ML infusion 500 mg  500 mg Intravenous Q24H Eudelia Bunchita A Abbud, MD   Stopped at 06/08/18 1600   . vancomycin (VANCOCIN) intermittent dosing Leisure centre manager(placeholder)   Other RX Placeholder Rita A Abbud, MD       . vancomycin (VANCOCIN) 1,500 mg in dextrose 5 % 500 mL IVPB  1,500 mg Intravenous Q12H Eudelia Bunchita A Abbud, MD   Stopped at 06/09/18 [phone]   . sodium chloride flush 0.9 % injection 10 mL  10 mL Intravenous 2 times per day Rose FillersAngela Shay, APRN - CNP   10 mL at 06/08/18 2054   . sodium  chloride flush 0.9 % injection 10 mL  10 mL Intravenous PRN Rose FillersAngela Shay, APRN - CNP       . polyethylene glycol (GLYCOLAX) packet 17 g  17 g Oral Daily PRN Rose FillersAngela Shay, APRN - CNP       . promethazine (PHENERGAN) tablet 12.5 mg  12.5 mg Oral Q6H PRN Rose FillersAngela Shay, APRN - CNP       . ondansetron (ZOFRAN) injection 4 mg  4 mg Intravenous Q6H PRN Rose FillersAngela Shay, APRN - CNP   4 mg at 06/09/18 0915   . enoxaparin (LOVENOX) injection 40 mg  40 mg Subcutaneous Daily Rose FillersAngela Shay, APRN - CNP   40 mg at 06/08/18 2054   . nicotine (NICODERM CQ) 21 MG/24HR 1 patch  1 patch Transdermal Daily Rose FillersAngela Shay, APRN - CNP   1 patch at 06/09/18 [id number]   . furosemide (LASIX) tablet 60 mg  60 mg Oral BID Rose FillersAngela Shay, APRN - CNP   60 mg at 06/09/18 0907   . lactulose (CHRONULAC) 10 GM/15ML solution 20 g  20 g Oral TID Rose FillersAngela Shay, APRN - CNP  20 g at 06/09/18 0907   . melatonin tablet 6 mg  6 mg Oral Daily Rose Fillers, APRN - CNP   6 mg at 06/08/18 2054   . tenofovir (VIREAD) tablet 300 mg  300 mg Oral Daily Rose Fillers, APRN - CNP             ALLERGIES:  Nubain [nalbuphine]; Morphine; Penicillin g; and Tylenol [acetaminophen]    Review of Systems   Constitutional: Positive for activity change, appetite change, fatigue and unexpected weight change. Negative for chills, diaphoresis and fever.   HENT: Negative.    Eyes: Negative.    Respiratory: Positive for chest tightness and shortness of breath. Negative for apnea, cough, choking, wheezing and stridor.    Cardiovascular: Negative.    Gastrointestinal: Positive for abdominal distention, abdominal pain and nausea. Negative for anal bleeding, blood in stool, constipation, diarrhea, rectal pain and vomiting.   Endocrine: Negative.    Genitourinary: Negative.    Musculoskeletal: Positive for arthralgias, back pain, joint swelling and myalgias. Negative for gait problem, neck pain and neck stiffness.   Skin: Negative.    Allergic/Immunologic: Negative.    Neurological: Negative.    Hematological:  Negative.    Psychiatric/Behavioral: Negative.            OBJECTIVE  PHYSICAL EXAM:  BP 101/65   Pulse 68   Temp 97.1 F (36.2 C) (Oral)   Resp 18   Ht 6\' 1"  (1.854 m)   Wt 225 lb (102.1 kg)   SpO2 100%   BMI 29.69 kg/m   Body mass index is 29.69 kg/m.  CONSTITUTIONAL:  awake, alert, cooperative, no apparent distress, and appears stated age  NECK:  supple, symmetrical, trachea midline, skin normal and no stridor  BACK: Symmetrical, no back tenderness he has limited range of motion due to body habitus  ABDOMEN: Still distended however less tender, slightly sensitive on deep palpation  MUSCULOSKELETAL:  there is no redness, warmth, or swelling of the joints  full range of motion noted  motor strength is 5 out of 5 all extremities bilaterally  tone is normal  NEUROLOGIC: Neurologically stable  SKIN:  no bruising or bleeding    DATA:   Diagnostic tests reviewed for today's visit:    All labs and imaging results reviewed.     Lab Results   Component Value Date    WBC 2.3 06/09/2018    HGB 7.6 06/09/2018    HCT 23.8 06/09/2018    MCV 69.5 06/09/2018    PLT 121 06/09/2018    NA 130 06/09/2018    K 4.1 06/09/2018    CL 96 06/09/2018    CO2 21 06/09/2018    BUN 20 06/09/2018    CREATININE 1.01 06/09/2018    CALCIUM 8.1 06/09/2018    ALKPHOS 143 06/09/2018    ALT 26 06/09/2018    AST 56 06/09/2018    BILITOT 0.5 06/09/2018    LABALBU 2.5 06/09/2018    LABALBU 3.1 05/31/2016   LABS  Recent Labs     06/07/18  1652 06/08/18  1409 06/08/18  2326 06/09/18  0611   WBC 5.5 3.2*  --  2.3*   RBC 3.38* 3.85*  --  3.43*   HGB 7.4* 8.3* 8.0* 7.6*   HCT 23.7* 27.2* 25.6* 23.8*   MCV 70.0* 70.7*  --  69.5*   MCH 21.9* 21.6*  --  22.0*   MCHC 31.4* 30.6*  --  31.7*   RDW 23.8* 23.3*  --  22.8*   PLT 123* 120*  --  121*       Recent Labs     06/07/18  1652 06/08/18  1409 06/09/18  0611   NA 120* 127* 130*   K 5.2* 4.6 4.1   CL 91* 97 96   CO2 19* 20 21   BUN 21* 22* 20   CREATININE 1.00 1.02 1.01   GLUCOSE 108* 146* 121*   CALCIUM  8.0* 8.0* 8.1*       Recent Labs     06/07/18  1652   MG 1.9         Xr Chest Standard (2 Vw)    Result Date: 06/08/2018  Patient MRN: [id number] DOB: September 20, 1970 Age:  21 years Gender: Male Order Date: 06/08/2018 1:30 PM. Exam: XR CHEST (2 VW) Number of Views: 2 Indication:  High-grade fever, sore throat cough and severe neck pain x3 days. Cough and fever. Comparison: None Findings: Cardiomediastinal silhouette is within normal limits. Lungs are clear without evidence of infiltrate/pneumonia, pneumothorax or pleural effusion     Impression:  No radiographic evidence of acute cardiopulmonary disease     Ct Abdomen Pelvis W Iv Contrast Additional Contrast? None    Result Date: 06/08/2018  Patient MRN: [id number] DOB: 19-Dec-1970 Age:  72 years Order Date: 06/07/2018 4:30 PM. Gender: Male Exam: CT ABDOMEN PELVIS W IV CONTRAST Number of Images: 603 Indication:   Abdominal pain, swelling, testicular swelling, history of cirrhosis Contrast dosage: Isovue-370, 100 mL, IV Comparison: None Findings: This examination was performed on a CT scanner with dose reduction. Technique: Low-dose CT  acquisition technique included one of following options; 1 . Automated exposure control, 2. Adjustment of MA and or KV according to patient's size or 3. Use of iterative reconstruction. Visualized lung bases demonstrate no evidence of noncalcified pulmonary nodule, spiculated mass, pneumothorax, hemothorax, pleural effusion or focal areas of airspace consolidation.. Large volume intraperitoneal ascites with a nodular contour of the shrunken appearing liver. Splenomegaly. Mild sized hiatal hernia. Esophageal varices noted. Pancreas, adrenal glands and kidneys unremarkable. Appendix not identified. Fluid extends into a large right inguinal hernia and into the scrotum where there are large bilateral fluid collections. No definitive evidence of bowel obstruction or free intraperitoneal air. Delayed imaging sequences demonstrate there is partial  opacification of the renal, ureteral or bladder areas. No large filling defect or mass is seen. No contrast extravasation outside normal renal, ureteral or bladder confines. There are portions of the  renal, ureteral or bladder systems are not opacified and are therefore not evaluated.. Vertebral body heights and intervertebral disc spaces are well-preserved. There is normal sagittal alignment of the visualized spine. No lytic or blastic bony lesions..     1. Findings consistent with decompensated cirrhosis with large volume intraperitoneal ascites, a shrunken liver with nodular contour, splenomegaly and esophageal variceal formation. 2. No evidence of bowel obstruction. 3. There is a large fluid-filled right inguinal hernia with large amounts of fluid extending within the scrotum.            ASSESSMENT & PLAN  Active Hospital Problems    Diagnosis Date Noted   . Generalized abdominal pain [R10.84] 06/08/2018   . Ascites of liver [R18.8] 06/08/2018   . Visceral hyperalgesia [R19.8] 06/08/2018   . Generalized muscle ache [M79.10] 06/08/2018   . Cirrhosis (HCC) [K74.60] 06/07/2018            RECOMMENDATION:  SEE ORDERS    We will start weaning on the Dilaudid to  0.5 every 4 hours  Switching the IM Norflex to p.o. Norflex  Addition of Ultram for breakthrough pain instead  Continue ambulation topical measures and medical management    SIGNATUREarna Coder [place] Lesure, MD PATIENT NAME:  Travis Ortega   DATE: June 09, 2018 MRN: [id number]   TIME: 12:55 PM PAGER/CONTACT #: [phone]

## 2018-06-09 NOTE — Plan of Care (Signed)
See OT evaluation for all goals and OT POC. Electronically signed by Lynden Ang, OTR/L on 06/09/2018 at 10:16 AM

## 2018-06-09 NOTE — Progress Notes (Addendum)
Infectious Diseases Inpatient Progress Note          HISTORY OF PRESENT ILLNESS:  Follow up acute febrile illness with Flu like sxs and decompensated liver cirrhosis. Had 9 lit paracentesis, has abdominal soreness, cough.  on IV Vanco and Levaquin, well tolerated. Patient had remarkable clinical improvement with decreased fevers. Blood Cx + MSSA.    Current Medications:    ??? orphenadrine  100 mg Oral BID   ??? pantoprazole  40 mg Oral QAM AC   ??? lidocaine  3 patch Transdermal Daily   ??? vancomycin (VANCOCIN) intermittent dosing Leisure centre manager)   Other RX Placeholder   ??? vancomycin  1,500 mg Intravenous Q12H   ??? sodium chloride flush  10 mL Intravenous 2 times per day   ??? enoxaparin  40 mg Subcutaneous Daily   ??? nicotine  1 patch Transdermal Daily   ??? furosemide  60 mg Oral BID   ??? lactulose  20 g Oral TID   ??? melatonin  6 mg Oral Daily   ??? tenofovir  300 mg Oral Daily       Allergies:  Nubain [nalbuphine]; Morphine; Penicillin g; and Tylenol [acetaminophen]      Review of Systems  14 system review is negative other than HPI    Physical Exam   Vitals:    06/09/18 0803 06/09/18 0959 06/09/18 1033 06/09/18 1116   BP: 131/61 109/63 (!) 103/58 101/65   Pulse: 63 64 64 68   Resp:  18 18 18    Temp: 98.1 ??F (36.7 ??C) 97.1 ??F (36.2 ??C)     TempSrc: Oral Oral     SpO2: 100% 100% 100%    Weight:       Height:           General Appearance: alert and oriented to person, place and time, well-developed and well-nourished, in no acute distress  Skin: warm and dry, no rash.   Head: normocephalic and atraumatic  Eyes: extraocular eye movements intact, conjunctivae normal, icteric sclerae  ENT: oropharynx clear and moist with normal mucous membranes. No thrush  Lungs: normal respiratory effort, Clear Lungs, no rhonchi, no crackles, no wheezes  Heart:RRR, nl S1/S2, no murmur  Abdomen: soft, + tenderness, distended with decreased ascites, + BS  NEUROLOGICAL: alert and oriented x 3, no focal deficits  +2 B leg edema  No erythema, no warmth,  no tenderness    DATA:    Lab Results   Component Value Date    WBC 2.3 (L) 06/09/2018    HGB 7.6 (L) 06/09/2018    HCT 23.8 (L) 06/09/2018    MCV 69.5 (L) 06/09/2018    PLT 121 (L) 06/09/2018     Lab Results   Component Value Date    CREATININE 1.01 06/09/2018    BUN 20 06/09/2018    NA 130 (L) 06/09/2018    K 4.1 06/09/2018    CL 96 06/09/2018    CO2 21 06/09/2018       Hepatic Function Panel:  Lab Results   Component Value Date    ALKPHOS 143 06/09/2018    ALT 26 06/09/2018    AST 56 06/09/2018    PROT 6.5 06/09/2018    BILITOT 0.5 06/09/2018    LABALBU 2.5 06/09/2018    LABALBU 3.1 05/31/2016       Microbiology:   Recent Labs     06/07/18  1903   BC No Growth to date.  Any change in status will be called.  Recent Labs     06/07/18  1935   BLOODCULT2 1 out of 2 blood cultures*   The following organisms and resistance markers have been  tested using nucleic acid testing technology.  ORGANISMS:  Staphylococcus aureus, Staphylococcus epidermidis,  Staphylococcus lugdunensis,Staphylococcus coagulase-negative,  Enterococcus faecalis, Enterococcus faecium, Listeria species,  Streptococcus pneumoniae, Streptococcus pyogenes (Gp A),  Streptococcus agalactiae (Gp B), Streptococcus anginosus gp,  Streptococcus species.  METHICILLIN RESISTANCE MARKER:  mec-A  VANCOMYCIN RESISTANCE MARKERS:  van-A  van-B  :     POSITIVE for  Sensitivity to follow  PBP2= Negative         IMPRESSION:    ?? MSSA bacteremia  ?? Decompensated liver cirrhosis   ?? Acute bronchitis, Flu like illness    Patient Active Problem List   Diagnosis   ??? Recurrent incisional hernia   ??? Cirrhosis (HCC)   ??? Generalized abdominal pain   ??? Ascites of liver   ??? Visceral hyperalgesia   ??? Generalized muscle ache       PLAN:  ?? D/C Levaquin  ?? IV Vanco  ?? BMP in am  ?? F/U repeat Blood Cx  ?? Will need IV antibiotics on discharge    Discussed with patient    Eudelia Bunch, MD

## 2018-06-09 NOTE — Progress Notes (Signed)
Physical Therapy Med Surg Initial Assessment  Facility/Department: [facility]  Room: O378/H885-02       NAME: Travis Ortega  DOB: 10/29/1970 (48 y.o.)  MRN: [id number]  CODE STATUS: Full Code    Date of Service: 06/09/2018    Patient Diagnosis(es): Cirrhosis [facility]) [K74.60]   Chief Complaint   Patient presents with   ??? Groin Pain     neck and back pain, emisis      Patient Active Problem List    Diagnosis Date Noted   ??? Generalized abdominal pain 06/08/2018   ??? Ascites of liver 06/08/2018   ??? Visceral hyperalgesia 06/08/2018   ??? Generalized muscle ache 06/08/2018   ??? Cirrhosis (HCC) 06/07/2018   ??? Recurrent incisional hernia         Past Medical History:   Diagnosis Date   ??? Cirrhosis (HCC)    ??? Hepatitis B    ??? Hepatitis C      Past Surgical History:   Procedure Laterality Date   ??? HERNIA REPAIR     ??? HERNIA REPAIR  07/09/2016   ??? KNEE SURGERY Right     arthroscopy    ??? PARACENTESIS Left 05/28/2018    total of 11,200 cc removed per Dr Floyde Parkins   ??? PARACENTESIS Left 06/04/2018    Total of per Dr. Floyde Parkins   ??? PR LAP, INCISIONAL HERNIA REPAIR,REDUCIBLE N/A 07/09/2016    LAPAROSCOPIC INCISIONAL HERNIA REPAIR WITH MESH. POSS OPEN. performed by Loreli Slot, MD at [facility] OR       Chart Reviewed: Yes  Patient assessed for rehabilitation services?: Yes  Family / Caregiver Present: No  General Comment  Comments: Pt awake in chair    Restrictions:  Restrictions/Precautions: Fall Risk     SUBJECTIVE: Subjective: States, "I just got sick, they just gave me Zofran". States, "I'm already doing therapy next door".     Pain  Pre Treatment Pain Screening  Pain at present: 5  Scale Used: Faces    Post Treatment Pain Screening:        Prior Level of Function:  Social/Functional History  Lives With: Family(Sister)  Type of Home: House  Home Layout: One level  Home Access: Level entry  Bathroom Shower/Tub: Cabin crew: Buyer, retail: Rolling walker  ADL Assistance:  Independent  Homemaking Assistance: Independent  Ambulation Assistance: Independent  Transfer Assistance: Independent  Additional Comments: Pt. has been at [facility] x1 month for rehab and plans to return to continue rehab    OBJECTIVE:   Vision: Within Functional Limits  Hearing: Within functional limits    Cognition:  Overall Orientation Status: Within Functional Limits  Follows Commands: Within Functional Limits    Observation/Palpation  Posture: Good  Observation: Significantly distended abdoman      Strength:  Strength RLE  Comment: grossly >/= 3+/5  Strength LLE  Comment: grossly >/= 3+/5    Neuro:  Balance  Posture: Fair  Comments: Balance NT due to nausea and awaiting procedure.         Sensation  Overall Sensation Status: WFL    Bed mobility  Sit to Supine: Modified independent    Transfers  Sit to Stand: Modified independent  Stand to sit: Modified independent    Ambulation  Ambulation?: Yes  Ambulation 1  Surface: level tile  Device: No Device  Assistance: Supervision  Quality of Gait: Antalgic gait, decreased gait speed, shuffling steps  Distance: 82ft  Comments:  Unable to formally test due to nausea and waiting for transport to parecentesis procedure. Shortly after, able to observe pt transfering/ambulating to transport bed.               Activity Tolerance  Activity Tolerance: (limited by nausea)          PT Education  PT Education: PT Role;Plan of Care    ASSESSMENT:   Body structures, Functions, Activity limitations: Decreased endurance;Decreased strength;Decreased balance  Decision Making: Medium Complexity  History: High  Exam: Medium  Clinical Presentation: Medium    Prognosis: Good    DISCHARGE RECOMMENDATIONS:  Discharge Recommendations: Patient would benefit from continued therapy after discharge, Continue to assess pending progress    Assessment: Pt reports his walking is not as good as it use to be, which he has been getting therapy for at [facility] [place]. Gait deficits- unclear how much is caused  by nausea and abdominal/scrotal pain. Pt will benefit from continued therapy for LE strengthening and gait/balance training, and further assessment upon Sx management.   REQUIRES PT FOLLOW UP: Yes      PLAN OF CARE:  Plan  Times per week: 3-6  Current Treatment Recommendations: Strengthening, Investment banker, operational, Equities trader, Mining engineer, Education, Sports administrator, Location manager, Chief of Staff, Building services engineer, Teaching laboratory technician, Positioning, Home Exercise Program, Teacher, early years/pre, Set designer Devices  Type of devices: All fall risk precautions in place    Goals:  Short term goals  Short term goal 1: Independent in bed mobility and transfers  Short term goal 2: Independent in ambulation >66ft  Short term goal 3: Able to tolerate >35min LE strengthening and gait/balance training    AMPAC (6 CLICK) BASIC MOBILITY  AM-PAC Inpatient Mobility Raw Score : 17     Therapy Time:   Individual   Time In 0920   Time Out 0930   Minutes 10        This therapy session was supervised by Valorie Roosevelt, PT      Annamary Carolin, 06/09/18 at 10:06 AM         Definitions for assistance levels  Independent = pt does not require any physical supervision or assistance from another person for activity completion. Device may be needed.  Stand by assistance = pt requires verbal cues or instructions from another person, close to but not touching, to perform the activity  Minimal assistance= pt performs 75% or more of the activity; assistance is required to complete the activity  Moderate assistance= pt performs 50% of the activity; assistance is required to complete the activity  Maximal assistance = pt performs 25% of the activity; assistance is required to complete the activity  Dependent = pt requires total physical assistance to accomplish the task

## 2018-06-09 NOTE — Progress Notes (Deleted)
Gastroenterology Progress Note    Travis Ortega is a 48 y.o. male patient.  Hospitalization Day:2    Chief C/O: abd pain and ascites    SUBJECTIVE: seen and examined in no acute distress, c/o mild abd pain is scheduled for paracentesis today.     ROS:  Gastrointestinal ROS: + abdominal pain, no change in bowel habits, or black or bloody stools    Physical    VITALS:  BP 131/61    Pulse 63    Temp 98.1 ??F (36.7 ??C) (Oral)    Resp 20    Ht 6\' 1"  (1.854 m)    Wt 225 lb (102.1 kg)    SpO2 100%    BMI 29.69 kg/m??   TEMPERATURE:  Current - Temp: 98.1 ??F (36.7 ??C); Max - Temp  Avg: 98.6 ??F (37 ??C)  Min: 98.1 ??F (36.7 ??C)  Max: 99.1 ??F (37.3 ??C)    General:  Alert and oriented,  No apparent distress  Skin- without jaundice  Eyes: anicteric sclera  Cardiac: RRR, Nl s1s2, without murmurs  Lungs CTA Bilaterally, normal effort  Abdomen soft, ND, NT, no HSM, Bowel sounds normal  Ext: without edema  Neuro: no asterixis     Data    Data Review:    Recent Labs     06/07/18  1652 06/08/18  1409 06/08/18  2326 06/09/18  0611   WBC 5.5 3.2*  --  2.3*   HGB 7.4* 8.3* 8.0* 7.6*   HCT 23.7* 27.2* 25.6* 23.8*   MCV 70.0* 70.7*  --  69.5*   PLT 123* 120*  --  121*     Recent Labs     06/07/18  1652 06/08/18  1409 06/09/18  0611   NA 120* 127* 130*   K 5.2* 4.6 4.1   CL 91* 97 96   CO2 19* 20 21   BUN 21* 22* 20   CREATININE 1.00 1.02 1.01     Recent Labs     06/07/18  1652 06/08/18  1409 06/09/18  0611   AST 54* 51* 56*   ALT 23 25 26    BILITOT 0.9* 0.5 0.5   ALKPHOS 160* 157* 143*     Recent Labs     06/07/18  1652   LIPASE 44     Recent Labs     06/07/18  1652   PROTIME 16.6*   INR 1.3           ASSESSMENT:  48 year old male with decompensated liver disease complicated by ascites, scrotal swelling and peripheral edema.  Patient also has hyponatremia , probably this is dilutional, also has low hemoglobin hematocrit with thrombocytopenia.  Patient had a CT of the abdomen and pelvis done yesterday which is consistent with decompensated  cirrhosis with large volume intraperitoneal ascites in the liver with nodular contours, splenomegaly, and esophageal variceal formation, would recommend therapeutic and diagnostic paracentesis and send the fluid for culture and also check triglyceride level since it was dark described as milky.     PLAN :  1. Diagnostic and therapeutic paracentesis today as scheduled  2. Recommend transfer to [facility] for consideration of TIPS as his primary GI team is [facility]  3. PPI  4. trend HH and transfuse as needed pre primary team, do not over transfuse      Thank you for allowing me to participate in the care of your patient.  Please feel free to contact me with any concerns.    Trellis Paganini  Elio Forget, APRN - CNP

## 2018-06-09 NOTE — Progress Notes (Signed)
Pt vital signs WNL  Pt has gross ascites  Being medicated for pain per orders  Tolerating diet well  Denies nausea at this time

## 2018-06-10 ENCOUNTER — Inpatient Hospital Stay: Admit: 2018-06-10 | Payer: MEDICARE | Primary: Family

## 2018-06-10 LAB — CBC WITH AUTO DIFFERENTIAL
Basophils %: 2.3 %
Basophils Absolute: 0 10*3/uL (ref 0.0–0.2)
Eosinophils %: 3.1 %
Eosinophils Absolute: 0.1 10*3/uL (ref 0.0–0.7)
Hematocrit: 23 % — ABNORMAL LOW (ref 42.0–52.0)
Hemoglobin: 7.2 g/dL — ABNORMAL LOW (ref 14.0–18.0)
Lymphocytes %: 17.6 %
Lymphocytes Absolute: 0.4 10*3/uL — ABNORMAL LOW (ref 1.0–4.8)
MCH: 21.6 pg — ABNORMAL LOW (ref 27.0–31.3)
MCHC: 31.2 % — ABNORMAL LOW (ref 33.0–37.0)
MCV: 69.4 fL — ABNORMAL LOW (ref 80.0–100.0)
Monocytes %: 15.7 %
Monocytes Absolute: 0.3 10*3/uL (ref 0.2–0.8)
Neutrophils %: 61.3 %
Neutrophils Absolute: 1.3 10*3/uL — ABNORMAL LOW (ref 1.4–6.5)
Platelets: 129 10*3/uL — ABNORMAL LOW (ref 130–400)
RBC: 3.31 M/uL — ABNORMAL LOW (ref 4.70–6.10)
RDW: 23 % — ABNORMAL HIGH (ref 11.5–14.5)
WBC: 2.1 10*3/uL — ABNORMAL LOW (ref 4.8–10.8)

## 2018-06-10 LAB — URINALYSIS WITH REFLEX TO CULTURE
Bilirubin Urine: NEGATIVE
Blood, Urine: NEGATIVE
Glucose, Ur: NEGATIVE mg/dL
Ketones, Urine: NEGATIVE mg/dL
Leukocyte Esterase, Urine: NEGATIVE
Nitrite, Urine: NEGATIVE
Protein, UA: NEGATIVE mg/dL
Specific Gravity, UA: 1.016 (ref 1.005–1.030)
Urobilinogen, Urine: 1 E.U./dL (ref <2.0)
pH, UA: 6 (ref 5.0–9.0)

## 2018-06-10 LAB — COMPREHENSIVE METABOLIC PANEL W/ REFLEX TO MG FOR LOW K
ALT: 24 U/L (ref 0–41)
AST: 51 U/L — ABNORMAL HIGH (ref 0–40)
Albumin: 2.7 g/dL — ABNORMAL LOW (ref 3.5–4.6)
Alkaline Phosphatase: 125 U/L — ABNORMAL HIGH (ref 35–104)
Anion Gap: 13 mEq/L (ref 9–15)
BUN: 15 mg/dL (ref 6–20)
CO2: 19 mEq/L — ABNORMAL LOW (ref 20–31)
Calcium: 8.3 mg/dL — ABNORMAL LOW (ref 8.5–9.9)
Chloride: 97 mEq/L (ref 95–107)
Creatinine: 0.94 mg/dL (ref 0.70–1.20)
GFR African American: >60 (ref >60)
GFR Non-African American: >60 (ref >60)
Globulin: 3.6 g/dL — ABNORMAL HIGH (ref 2.3–3.5)
Glucose: 132 mg/dL — ABNORMAL HIGH (ref 70–99)
Potassium reflex Magnesium: 3.9 mEq/L (ref 3.4–4.9)
Sodium: 129 mEq/L — ABNORMAL LOW (ref 135–144)
Total Bilirubin: 0.6 mg/dL (ref 0.2–0.7)
Total Protein: 6.3 g/dL (ref 6.3–8.0)

## 2018-06-10 LAB — IRON AND TIBC
Iron Saturation: 4 % — ABNORMAL LOW (ref 11–46)
Iron: 10 ug/dL — ABNORMAL LOW (ref 59–158)
TIBC: 223 ug/dL (ref 178–450)

## 2018-06-10 LAB — CULTURE, BLOOD 2
Culture, Blood 2: 1 — AB
Culture, Blood 2: POSITIVE
Organism: DETECTED — AB

## 2018-06-10 LAB — FERRITIN: Ferritin: 41.3 ng/mL (ref 30.0–400.0)

## 2018-06-10 LAB — VANCOMYCIN LEVEL, TROUGH: Vancomycin Tr: 17.4 ug/mL (ref 10.0–20.0)

## 2018-06-10 LAB — BASIC METABOLIC PANEL: Potassium: 3.9 mEq/L (ref 3.4–4.9)

## 2018-06-10 MED ORDER — HYDROMORPHONE HCL 1 MG/ML IJ SOLN
1 MG/ML | Freq: Once | INTRAMUSCULAR | Status: DC
Start: 2018-06-10 — End: 2018-06-10

## 2018-06-10 MED ORDER — HYDROMORPHONE HCL 1 MG/ML IJ SOLN
1 MG/ML | Freq: Once | INTRAMUSCULAR | Status: AC
Start: 2018-06-10 — End: 2018-06-10
  Administered 2018-06-10: 20:00:00 0.5 mg via INTRAVENOUS

## 2018-06-10 MED ORDER — FERROUS SULFATE 325 (65 FE) MG PO TABS
325 (65 Fe) MG | Freq: Every day | ORAL | Status: DC
Start: 2018-06-10 — End: 2018-06-16
  Administered 2018-06-12 – 2018-06-16 (×5): 325 mg via ORAL

## 2018-06-10 MED ORDER — FUROSEMIDE 40 MG PO TABS
40 MG | Freq: Every day | ORAL | Status: DC
Start: 2018-06-10 — End: 2018-06-14
  Administered 2018-06-11 – 2018-06-12 (×2): 40 mg via ORAL

## 2018-06-10 MED FILL — FUROSEMIDE 20 MG PO TABS: 20 mg | ORAL | Qty: 1

## 2018-06-10 MED FILL — ONDANSETRON HCL 4 MG/2ML IJ SOLN: 4 MG/2ML | INTRAMUSCULAR | Qty: 2

## 2018-06-10 MED FILL — VANCOMYCIN HCL 5 G IV SOLR: 5 g | INTRAVENOUS | Qty: 1500

## 2018-06-10 MED FILL — HYDROXYZINE HCL 25 MG PO TABS: 25 mg | ORAL | Qty: 1

## 2018-06-10 MED FILL — LIDOCAINE PAIN RELIEF 4 % EX PTCH: 4 % | CUTANEOUS | Qty: 3

## 2018-06-10 MED FILL — ORPHENADRINE CITRATE ER 100 MG PO TB12: 100 mg | ORAL | Qty: 1

## 2018-06-10 MED FILL — DILAUDID 1 MG/ML IJ SOLN: 1 mg/mL | INTRAMUSCULAR | Qty: 0.5

## 2018-06-10 MED FILL — MELATONIN 1 MG PO TABS: 1 mg | ORAL | Qty: 1

## 2018-06-10 MED FILL — LACTULOSE 10 GM/15ML PO SOLN: 10 GM/15ML | ORAL | Qty: 30

## 2018-06-10 MED FILL — NICOTINE 21 MG/24HR TD PT24: 21 mg/(24.h) | TRANSDERMAL | Qty: 1

## 2018-06-10 MED FILL — PANTOPRAZOLE SODIUM 40 MG PO TBEC: 40 mg | ORAL | Qty: 1

## 2018-06-10 MED FILL — SPIRONOLACTONE 50 MG PO TABS: 50 mg | ORAL | Qty: 2

## 2018-06-10 NOTE — Progress Notes (Signed)
Physician Progress Note    06/10/2018   3:37 PM    Name:  Travis Ortega  MRN:    [id number]     IP Day: 3     Admit Date: 06/07/2018  4:23 PM  PCP: Yaakov Guthrie, APRN - CNP    Code Status:  Full Code    Subjective:     He still complains of significant abdominal pain and is asking if his pain medicine frequency can be increased.  He also complains of significant pain in his neck and in his mid back.  He denies chest pain, dyspnea.    Current Facility-Administered Medications   Medication Dose Route Frequency Provider Last Rate Last Dose   ??? [START ON 06/11/2018] furosemide (LASIX) tablet 40 mg  40 mg Oral Daily Lowry Bowl, DO       ??? traMADol (ULTRAM) tablet 50 mg  50 mg Oral Q6H PRN Earna Coder, MD       ??? orphenadrine (NORFLEX) extended release tablet 100 mg  100 mg Oral BID Earna Coder, MD   100 mg at 06/10/18 0848   ??? HYDROmorphone (DILAUDID) injection 0.5 mg  0.5 mg Intravenous Q4H PRN Earna Coder, MD   0.5 mg at 06/10/18 1252   ??? melatonin tablet 10 mg  10 mg Oral Daily Lowry Bowl, DO   10 mg at 06/09/18 1958   ??? hydrOXYzine (ATARAX) tablet 25 mg  25 mg Oral Q6H PRN Lowry Bowl, DO   25 mg at 06/10/18 1325   ??? spironolactone (ALDACTONE) tablet 100 mg  100 mg Oral Daily Mirian Capuchin, MD   100 mg at 06/10/18 0847   ??? pantoprazole (PROTONIX) tablet 40 mg  40 mg Oral QAM AC Yazid R Hussein, DO   40 mg at 06/10/18 0503   ??? lidocaine 4 % external patch 3 patch  3 patch Transdermal Daily Earna Coder, MD   3 patch at 06/10/18 0848   ??? vancomycin (VANCOCIN) intermittent dosing Leisure centre manager)   Other RX Placeholder Eudelia Bunch, MD       ??? vancomycin (VANCOCIN) 1,500 mg in dextrose 5 % 500 mL IVPB  1,500 mg Intravenous Q12H Glenis Smoker Abbud, MD   Stopped at 06/10/18 0737   ??? sodium chloride flush 0.9 % injection 10 mL  10 mL Intravenous 2 times per day Rose Fillers, APRN - CNP   10 mL at 06/10/18 0849   ??? sodium chloride flush 0.9 % injection 10 mL  10 mL Intravenous PRN Rose Fillers, APRN - CNP        ??? polyethylene glycol (GLYCOLAX) packet 17 g  17 g Oral Daily PRN Rose Fillers, APRN - CNP       ??? promethazine (PHENERGAN) tablet 12.5 mg  12.5 mg Oral Q6H PRN Rose Fillers, APRN - CNP       ??? ondansetron (ZOFRAN) injection 4 mg  4 mg Intravenous Q6H PRN Rose Fillers, APRN - CNP   4 mg at 06/10/18 1106   ??? enoxaparin (LOVENOX) injection 40 mg  40 mg Subcutaneous Daily Rose Fillers, APRN - CNP   40 mg at 06/08/18 2054   ??? nicotine (NICODERM CQ) 21 MG/24HR 1 patch  1 patch Transdermal Daily Rose Fillers, APRN - CNP   1 patch at 06/10/18 0848   ??? lactulose (CHRONULAC) 10 GM/15ML solution 20 g  20 g Oral TID Rose Fillers, APRN - CNP   20 g at 06/10/18 0847   ???  tenofovir (VIREAD) tablet 300 mg  300 mg Oral Daily Rose Fillers, APRN - CNP           Physical Examination:      Vitals:  BP 112/68    Pulse 71    Temp 97.9 ??F (36.6 ??C) (Oral)    Resp 17    Ht 6\' 1"  (1.854 m)    Wt 225 lb (102.1 kg)    SpO2 96%    BMI 29.69 kg/m??   Temp (24hrs), Avg:97.9 ??F (36.6 ??C), Min:97.9 ??F (36.6 ??C), Max:97.9 ??F (36.6 ??C)    General appearance: alert, cooperative and no distress  Mental Status: oriented to person, place and time and normal affect  Back: Point tenderness in C-spine and and T-spine.  Lungs: clear to auscultation bilaterally, normal effort  Heart: regular rate and rhythm, no murmur  Abdomen: Diffuse tenderness to palpation with guarding and mild rebound tenderness.  Soft.  Bowel sounds present.  Extremities: 1+ pitting edema bilaterally  Skin: no gross lesions, rashes  Neuro: Mild asterixis.  Weakness in right upper extremity and right lower extremity.    Data:     Labs:  Recent Labs     06/09/18  0611 06/10/18  0657   WBC 2.3* 2.1*   HGB 7.6* 7.2*   PLT 121* 129*     Recent Labs     06/09/18  0611 06/10/18  0657   NA 130* 129*   K 4.1 3.9   3.9   CL 96 97   CO2 21 19*   BUN 20 15   CREATININE 1.01 0.94   GLUCOSE 121* 132*     Recent Labs     06/09/18  0611 06/10/18  0657   AST 56* 51*   ALT 26 24   BILITOT 0.5 0.6   ALKPHOS  143* 125*       Assessment and Plan:        48 year old male with cirrhosis (Hep B/C, and h/o heavy etOH use) presented with worsening abdominal pain and scrotal swelling.    1.  Decompensated liver cirrhosis with large volume ascites and significant abdominal pain.  Possible trigger MSSA infection  -Ascitic studies negative for SBP  -Diuretics per GI-Aldactone added  -Continue lactulose  -TIPS evaluation at [facility] after treatment of infection complete  -Pain management following    2.  MSSA bacteremia: 1 out of 2 positive.  Source unclear.  -We will need IV antibiotics on discharge.  Plan for PICC line tomorrow if cultures negative  -TTE to rule out obvious valvular vegetation  -Significant point tenderness of C-spine and T-spine, along with weakness of right upper extremity and right lower extremity-we will obtain MRI of C-spine, T-spine, and L-spine to rule out discitis or abscess.  Consider neurology consult depending on results    3.  Iron deficiency anemia: Continue PPI.  Supplement iron    Diet: DIET GENERAL; Low Sodium (2 GM)  Ppx: lovenox  Full Code    Dispo: [facility] when medically stable    Electronically signed by Lowry Bowl, DO on 06/10/2018 at 3:37 PM

## 2018-06-10 NOTE — Progress Notes (Signed)
Pt assessment complete this am. Pt alert and oriented. Medicated for pain and nausea. As ordered. Tolerating general diet. ECHO completed. Pt down for MRI. questionaires filled out and in chart. Gave prn atarax for anxiety. Vs stable.

## 2018-06-10 NOTE — Care Coordination-Inpatient (Signed)
Met with pt. Plan remains return to [facility].  Reviewed IMM.

## 2018-06-10 NOTE — Progress Notes (Signed)
Pharmacy Vancomycin Consult     Vancomycin Day: 3  Current Dosing: 1500 mg Q12h    Recent Labs     06/09/18  0611 06/10/18  0657   BUN 20 15       Recent Labs     06/09/18  0611 06/10/18  0657   CREATININE 1.01 0.94       Recent Labs     06/09/18  0611 06/10/18  0657   WBC 2.3* 2.1*         Intake/Output Summary (Last 24 hours) at 06/10/2018 0934  Last data filed at 06/10/2018 0507  Gross per 24 hour   Intake 660 ml   Output --   Net 660 ml       Culture Date      Source                       Results  Culture, Blood 1 [[id number]] Collected: 06/09/18 3790   Order Status: Completed Specimen: Blood Updated: 06/10/18 0815    Blood Culture, Routine No Growth to date.  Any change in status will be called.   Narrative:     ORDER#: [id number]                          ORDERED BY: ABBUD, RITA  SOURCE: Blood Blood                        COLLECTED:  06/09/18 06:11  ANTIBIOTICS AT COLL.:                      RECEIVED :  06/09/18 06:25   Culture, Blood 2 [[id number]] (Abnormal)  Collected: 06/07/18 1935   Order Status: Completed Specimen: Blood Updated: 06/10/18 0715    Culture, Blood 2 1 out of 2 blood culturesAbnormal     Organism Staph aureus DNA DetectedAbnormal     Culture, Blood 2 --    The following organisms and resistance markers have been   tested using nucleic acid testing technology.   ORGANISMS:   Staphylococcus aureus, Staphylococcus epidermidis,   Staphylococcus lugdunensis,Staphylococcus coagulase-negative,   Enterococcus faecalis, Enterococcus faecium, Listeria species,   Streptococcus pneumoniae, Streptococcus pyogenes (Gp A),   Streptococcus agalactiae (Gp B), Streptococcus anginosus gp,   Streptococcus species.   METHICILLIN RESISTANCE MARKER:   mec-A   VANCOMYCIN RESISTANCE MARKERS:   van-A   van-B  :     Organism Staph aureus MSSAAbnormal     Culture, Blood 2 --    POSITIVE for   PBP2= Negative    Narrative:     ORDER#: [id number]                          ORDERED BY: Glenna Durand, DAVID  SOURCE: Blood                               COLLECTED:  06/07/18 19:35  ANTIBIOTICS AT COLL.:                      RECEIVED :  06/07/18 19:41  CALL  [id number] tel. [phone],  Microbiology results called to and read back by Orlie Pollen RN, 06/08/2018  16:10, by [facility]   Culture, Blood 2 [[id number]] Collected: 06/09/18 1423   Order Status: Sent Specimen:  Blood Updated: 06/09/18 1428   Gram Stain [[id number]] Collected: 06/09/18 1307   Order Status: Completed Specimen: Body Fluid from Ascitic Fluid Updated: 06/09/18 1423    Gram Stain Result Rare WBC's   No organisms seen    Narrative:     ORDER#: [id number]                          ORDERED BY: RUFFNER, JONI  SOURCE: Ascites Body Fluid                 COLLECTED:  06/09/18 13:07  ANTIBIOTICS AT COLL.:                      RECEIVED :  06/09/18 13:07   Culture, Body Fluid [[id number]] Collected: 06/09/18 1307   Order Status: No result Updated: 06/09/18 1351   Culture, Body Fluid [[id number]]    Order Status: No result Specimen: Body Fluid from Ascitic Fluid    Culture, Body Fluid [[id number]] Collected: 06/07/18 1941   Order Status: Completed Specimen: Body Fluid from Ascitic Fluid Updated: 06/09/18 1029    Body Fluid Culture, Sterile No growth 24 hours   Narrative:     ORDER#: [id number]                          ORDERED BY: Glenna DurandYIN, DAVID  SOURCE: Ascites Body Fluid                 COLLECTED:  06/07/18 19:41  ANTIBIOTICS AT COLL.:                      RECEIVED :  06/07/18 19:41  Concentrate (centrifuge) body fluid add SCNFL to order   Culture, Blood 1 [[id number]] Collected: 06/07/18 1903   Order Status: Completed Specimen: Blood Updated: 06/08/18 2215    Blood Culture, Routine No Growth to date.  Any change in status will be called.   Narrative:     ORDER#: [id number]                          ORDERED BY: Glenna DurandYIN, DAVID  SOURCE: Blood                              COLLECTED:  06/07/18 19:03  ANTIBIOTICS AT COLL.:                      RECEIVED :  06/07/18 19:03   Gram Stain [[id number]] Collected: 06/07/18 1941    Order Status: Completed Specimen: Ascitic Fluid Updated: 06/08/18 1347    Gram Stain Result Few WBC's, No organisms seen   Narrative:     ORDER#: [id number]                          ORDERED BY: Glenna DurandYIN, DAVID  SOURCE: Ascites Body Fluid                 COLLECTED:  06/07/18 19:41  ANTIBIOTICS AT COLL.:                      RECEIVED :  06/07/18 19:41   Rapid Influenza A/B Antigens [[id number]] Collected: 06/07/18 1659   Order Status: Completed Specimen: Nasopharyngeal Updated: 06/07/18 1716    Influenza A by PCR Negative  Comment: Effective 12/05/17   Please note methodology and/or reference ranges have changed.        Influenza B by PCR Negative    Comment: Effective 12/05/17   Please note methodology and/or reference ranges have changed.           Ht Readings from Last 1 Encounters:   06/07/18 6\' 1"  (1.854 m)        Wt Readings from Last 1 Encounters:   06/07/18 225 lb (102.1 kg)         Body mass index is 29.69 kg/m??.    Estimated Creatinine Clearance: 125 mL/min (based on SCr of 0.94 mg/dL).    Trough: 17.4    Assessment/Plan:  Trough therapeutic at 17.4 before 4th dose. Will continue current regimen of 1500 mg Q12h. Consider new TR in one week from last TR, if vanco still needed. Cultures resulted MSSA, will defer to ID for abx choice    Lawanna Kobus, PharmD, BCPS  Staff Pharmacist  06/10/2018 9:43 AM

## 2018-06-10 NOTE — Progress Notes (Signed)
MRI complete spine was completed without contrast.  Patient was medicated x 2 for pain and still moved and need to get up multiple times.  Patient was given lasix and needed to get up 6 times to urinate. The patient did not want to continue with the injection of contrast because he could not tolerate any more.  I called the patient nurse Danielle who stated she would contact ordering dr

## 2018-06-10 NOTE — Progress Notes (Signed)
Notified Dr. Pricilla Riffle pt was unable to tolerate MRI. Did not complete contrast order only without

## 2018-06-10 NOTE — Progress Notes (Signed)
Physical Therapy Missed Treatment   Facility/Department: [facility] [id number]    NAME: Travis Ortega  Patient Status:   DOB: 1970-08-23 (48 y.o.)  MRN: [id number]  Account: [id number]  Gender: male        [x]  Patient Declines PT Treatment            []  Patient Unavailable:     Pt has been ambulating numerous times around halls and reports he is very nauseated. Alerted RN that pt is asking for zofran.   Will attempt PT Treatment again at earliest convenience.        Electronically signed by Toy Cookey, PTA on 06/10/18 at 10:46 AM

## 2018-06-10 NOTE — Progress Notes (Signed)
Infectious Diseases Inpatient Progress Note          HISTORY OF PRESENT ILLNESS:  Follow up acute febrile illness with Flu like sxs and decompensated liver cirrhosis. Had 9 lit paracentesis, has abdominal soreness, cough.  on IV Vanco and Levaquin, well tolerated. Patient had remarkable clinical improvement with decreased fevers. Blood Cx + MSSA.    Current Medications:    . [START ON 06/11/2018] furosemide  40 mg Oral Daily   . orphenadrine  100 mg Oral BID   . melatonin  10 mg Oral Daily   . spironolactone  100 mg Oral Daily   . pantoprazole  40 mg Oral QAM AC   . lidocaine  3 patch Transdermal Daily   . vancomycin (VANCOCIN) intermittent dosing Leisure centre manager)   Other Control and instrumentation engineer   . vancomycin  1,500 mg Intravenous Q12H   . sodium chloride flush  10 mL Intravenous 2 times per day   . enoxaparin  40 mg Subcutaneous Daily   . nicotine  1 patch Transdermal Daily   . lactulose  20 g Oral TID   . tenofovir  300 mg Oral Daily       Allergies:  Nubain [nalbuphine]; Morphine; Penicillin g; and Tylenol [acetaminophen]      Review of Systems  14 system review is negative other than HPI    Physical Exam   Vitals:    06/09/18 1033 06/09/18 1116 06/09/18 1916 06/10/18 0823   BP: (!) 103/58 101/65 103/62 112/68   Pulse: 64 68 74 71   Resp: 18 18 18 17    Temp:   97.9 F (36.6 C) 97.9 F (36.6 C)   TempSrc:   Oral Oral   SpO2: 100%  98% 96%   Weight:       Height:           General Appearance: alert and oriented to person, place and time, well-developed and well-nourished, in no acute distress  Skin: warm and dry, no rash.   Head: normocephalic and atraumatic  Eyes: extraocular eye movements intact, conjunctivae normal, icteric sclerae  ENT: oropharynx clear and moist with normal mucous membranes. No thrush  Lungs: normal respiratory effort, Clear Lungs, no rhonchi, no crackles, no wheezes  Heart:RRR, nl S1/S2, no murmur  Abdomen: soft, + tenderness, distended with decreased ascites, + BS  NEUROLOGICAL: alert and oriented x  3, no focal deficits  +2 B leg edema  No erythema, no warmth, no tenderness    DATA:    Lab Results   Component Value Date    WBC 2.1 (L) 06/10/2018    HGB 7.2 (L) 06/10/2018    HCT 23.0 (L) 06/10/2018    MCV 69.4 (L) 06/10/2018    PLT 129 (L) 06/10/2018     Lab Results   Component Value Date    CREATININE 0.94 06/10/2018    BUN 15 06/10/2018    NA 129 (L) 06/10/2018    K 3.9 06/10/2018    K 3.9 06/10/2018    CL 97 06/10/2018    CO2 19 (L) 06/10/2018       Hepatic Function Panel:  Lab Results   Component Value Date    ALKPHOS 125 06/10/2018    ALT 24 06/10/2018    AST 51 06/10/2018    PROT 6.3 06/10/2018    BILITOT 0.6 06/10/2018    LABALBU 2.7 06/10/2018    LABALBU 3.1 05/31/2016       Microbiology:   Recent Labs  06/09/18  0611   BC No Growth to date.  Any change in status will be called.     Recent Labs     06/07/18  1935   BLOODCULT2 1 out of 2 blood cultures*  The following organisms and resistance markers have been  tested using nucleic acid testing technology.  ORGANISMS:  Staphylococcus aureus, Staphylococcus epidermidis,  Staphylococcus lugdunensis,Staphylococcus coagulase-negative,  Enterococcus faecalis, Enterococcus faecium, Listeria species,  Streptococcus pneumoniae, Streptococcus pyogenes (Gp A),  Streptococcus agalactiae (Gp B), Streptococcus anginosus gp,  Streptococcus species.  METHICILLIN RESISTANCE MARKER:  mec-A  VANCOMYCIN RESISTANCE MARKERS:  van-A  van-B  :    POSITIVE for  PBP2= Negative         IMPRESSION:     MSSA bacteremia, no obvious source   Decompensated liver cirrhosis with reaccumulating fluid   Acute bronchitis, Flu like illness, no pneumonia    Patient Active Problem List   Diagnosis   . Recurrent incisional hernia   . Cirrhosis (HCC)   . Generalized abdominal pain   . Ascites of liver   . Visceral hyperalgesia   . Generalized muscle ache       PLAN:   IV Vanco   F/U Vanco trough and BMP   F/U repeat Blood Cx   2D Echo since no source for bacteremia   Will need IV  antibiotics on discharge, PICC once negative Blood Cx   Recommend fluid restrictions and Aldactone    Discussed with patient    Eudelia Bunch, MD

## 2018-06-10 NOTE — Consults (Signed)
HPI:  Patient is a pleasant 48 year old gentleman with a history of alcoholic liver cirrhosis and ascites requiring routine paracentesis. Patient was admitted to the hospital due to complaints of fevers, chills, and flulike symptoms. During hospital stay, his abdomen also became distended with fluid requiring a paracentesis. Interventional radiology was consulted for a diagnostic and therapeutic ultrasound-guided paracentesis.         Family History   Problem Relation Age of Onset   ??? Cancer Mother        Past Surgical History:   Procedure Laterality Date   ??? HERNIA REPAIR     ??? HERNIA REPAIR  07/09/2016   ??? KNEE SURGERY Right     arthroscopy    ??? PARACENTESIS Left 05/28/2018    total of 11,200 cc removed per Dr Floyde Parkins   ??? PARACENTESIS Left 06/04/2018    Total of per Dr. Floyde Parkins   ??? PR LAP, INCISIONAL HERNIA REPAIR,REDUCIBLE N/A 07/09/2016    LAPAROSCOPIC INCISIONAL HERNIA REPAIR WITH MESH. POSS OPEN. performed by Loreli Slot, MD at [facility] OR        Past Medical History:   Diagnosis Date   ??? Cirrhosis (HCC)    ??? Hepatitis B    ??? Hepatitis C        Social History     Socioeconomic History   ??? Marital status: Single     Spouse name: None   ??? Number of children: None   ??? Years of education: None   ??? Highest education level: None   Occupational History   ??? None   Social Needs   ??? Financial resource strain: None   ??? Food insecurity:     Worry: None     Inability: None   ??? Transportation needs:     Medical: None     Non-medical: None   Tobacco Use   ??? Smoking status: Current Every Day Smoker     Packs/day: 0.50     Types: Cigarettes   ??? Smokeless tobacco: Never Used   Substance and Sexual Activity   ??? Alcohol use: No   ??? Drug use: No   ??? Sexual activity: None   Lifestyle   ??? Physical activity:     Days per week: None     Minutes per session: None   ??? Stress: None   Relationships   ??? Social connections:     Talks on phone: None     Gets together: None     Attends religious service: None     Active member of club  or organization: None     Attends meetings of clubs or organizations: None     Relationship status: None   ??? Intimate partner violence:     Fear of current or ex partner: None     Emotionally abused: None     Physically abused: None     Forced sexual activity: None   Other Topics Concern   ??? None   Social History Narrative   ??? None       Allergies   Allergen Reactions   ??? Nubain [Nalbuphine] Anaphylaxis and Hives   ??? Morphine Swelling   ??? Penicillin G Swelling     Throat swells   ??? Tylenol [Acetaminophen] Rash       No current facility-administered medications on file prior to encounter.      Current Outpatient Medications on File Prior to Encounter   Medication Sig Dispense Refill   ??? lactulose  encephalopathy 10 GM/15ML SOLN solution Take 20 g by mouth 3 times daily Indications: pt unsure of dose     ??? ipratropium-albuterol (DUONEB) 0.5-2.5 (3) MG/3ML SOLN nebulizer solution Inhale 1 vial into the lungs every 6 hours as needed for Shortness of Breath     ??? melatonin 3 MG TABS tablet Take 6 mg by mouth daily     ??? omeprazole (PRILOSEC) 20 MG delayed release capsule Take 20 mg by mouth daily     ??? oxyCODONE (ROXICODONE) 5 MG immediate release tablet Take 15 mg by mouth every 4 hours as needed.      ??? spironolactone (ALDACTONE) 100 MG tablet Take 100 mg by mouth daily     ??? ibuprofen (ADVIL;MOTRIN) 800 MG tablet Take 1 tablet by mouth every 6 hours as needed for Pain 20 tablet 0   ??? triamcinolone (KENALOG) 0.025 % cream APP EXT AA BID  0   ??? polyethylene glycol (GLYCOLAX) powder take 17GM (DISSOLVED IN WATER) by mouth once daily  0   ??? ondansetron (ZOFRAN) 4 MG tablet Take 4 mg by mouth Daily      ??? baclofen (LIORESAL) 10 MG tablet Take 10 mg by mouth twice a week      ??? tenofovir (VIREAD) 300 MG tablet TK 1 T PO Daily  0   ??? furosemide (LASIX) 20 MG tablet Take 60 mg by mouth 2 times daily          Review of Systems   Constitutional: Positive for chills, fatigue and fever. Negative for diaphoresis.   HENT: Negative.   Negative for congestion, ear pain, hearing loss and tinnitus.    Eyes: Negative.  Negative for photophobia.   Respiratory: Negative.  Negative for cough, shortness of breath and wheezing.    Cardiovascular: Negative.  Negative for chest pain, palpitations and leg swelling.   Gastrointestinal: Positive for abdominal distention and abdominal pain. Negative for constipation, diarrhea, nausea and vomiting.   Genitourinary: Negative.  Negative for dysuria, flank pain, frequency, hematuria and urgency.   Musculoskeletal: Negative.  Negative for back pain and neck pain.   Skin: Negative.  Negative for rash.   Allergic/Immunologic: Negative for environmental allergies.   Neurological: Negative.  Negative for dizziness, tremors, weakness and headaches.   Hematological: Does not bruise/bleed easily.   Psychiatric/Behavioral: Negative.  Negative for hallucinations and suicidal ideas. The patient is not nervous/anxious.        OBJECTIVE:  BP 112/68    Pulse 71    Temp 97.9 ??F (36.6 ??C)    Resp 18    Ht 6\' 1"  (1.854 m)    Wt 225 lb (102.1 kg)    SpO2 96%    BMI 29.69 kg/m??     Physical Exam  Constitutional:       General: He is not in acute distress.     Appearance: He is well-developed. He is not diaphoretic.   HENT:      Head: Normocephalic and atraumatic.      Nose: Nose normal.      Mouth/Throat:      Pharynx: No oropharyngeal exudate.   Eyes:      General: No scleral icterus.        Right eye: No discharge.         Left eye: No discharge.      Conjunctiva/sclera: Conjunctivae normal.   Neck:      Musculoskeletal: Neck supple.      Thyroid: No thyromegaly.  Vascular: No JVD.      Trachea: No tracheal deviation.   Cardiovascular:      Rate and Rhythm: Normal rate and regular rhythm.      Heart sounds: Normal heart sounds. No murmur. No friction rub. No gallop.    Pulmonary:      Effort: No respiratory distress.      Breath sounds: No stridor. No wheezing or rales.   Chest:      Chest wall: No tenderness.   Abdominal:       General: Bowel sounds are normal. There is distension.      Palpations: There is no mass.      Tenderness: There is abdominal tenderness. There is no guarding or rebound.      Hernia: No hernia is present.   Musculoskeletal:         General: No tenderness or deformity.   Skin:     General: Skin is dry.      Coloration: Skin is not pale.      Findings: No erythema or rash.   Neurological:      Mental Status: He is alert and oriented to person, place, and time.      Cranial Nerves: No cranial nerve deficit.   Psychiatric:         Behavior: Behavior normal.         Thought Content: Thought content normal.         Judgment: Judgment normal.         ASSESSMENT ANDPLAN:    ASSESSMENT: Patient with history of liver cirrhosis and ascites admitted secondary to flulike symptoms    PLAN: Ultrasound-guided diagnostic and therapeutic paracentesis

## 2018-06-10 NOTE — Progress Notes (Signed)
Patient is alert and oriented x4, ambulates well with walker. Patient walked the hall a couple times tonight, tolerated well. Paracentesis was done on 2/24, band aid is clean, dry, intact.

## 2018-06-10 NOTE — Telephone Encounter (Signed)
Patient admitted to hospital.

## 2018-06-11 ENCOUNTER — Inpatient Hospital Stay: Admit: 2018-06-11 | Payer: MEDICARE | Primary: Family

## 2018-06-11 LAB — CBC WITH AUTO DIFFERENTIAL
Basophils %: 1.5 %
Basophils Absolute: 0 10*3/uL (ref 0.0–0.2)
Eosinophils %: 2.7 %
Eosinophils Absolute: 0.1 10*3/uL (ref 0.0–0.7)
Hematocrit: 24.3 % — ABNORMAL LOW (ref 42.0–52.0)
Hemoglobin: 7.7 g/dL — ABNORMAL LOW (ref 14.0–18.0)
Lymphocytes %: 22.2 %
Lymphocytes Absolute: 0.6 10*3/uL — ABNORMAL LOW (ref 1.0–4.8)
MCH: 21.9 pg — ABNORMAL LOW (ref 27.0–31.3)
MCHC: 31.7 % — ABNORMAL LOW (ref 33.0–37.0)
MCV: 69.2 fL — ABNORMAL LOW (ref 80.0–100.0)
Monocytes %: 15.5 %
Monocytes Absolute: 0.4 10*3/uL (ref 0.2–0.8)
Neutrophils %: 58.1 %
Neutrophils Absolute: 1.6 10*3/uL (ref 1.4–6.5)
Platelets: 141 10*3/uL (ref 130–400)
RBC: 3.52 M/uL — ABNORMAL LOW (ref 4.70–6.10)
RDW: 23.2 % — ABNORMAL HIGH (ref 11.5–14.5)
WBC: 2.8 10*3/uL — ABNORMAL LOW (ref 4.8–10.8)

## 2018-06-11 LAB — COMPREHENSIVE METABOLIC PANEL W/ REFLEX TO MG FOR LOW K
ALT: 27 U/L (ref 0–41)
AST: 48 U/L — ABNORMAL HIGH (ref 0–40)
Albumin: 3 g/dL — ABNORMAL LOW (ref 3.5–4.6)
Alkaline Phosphatase: 147 U/L — ABNORMAL HIGH (ref 35–104)
Anion Gap: 11 mEq/L (ref 9–15)
BUN: 19 mg/dL (ref 6–20)
CO2: 22 mEq/L (ref 20–31)
Calcium: 8.2 mg/dL — ABNORMAL LOW (ref 8.5–9.9)
Chloride: 99 mEq/L (ref 95–107)
Creatinine: 1.07 mg/dL (ref 0.70–1.20)
GFR African American: >60 (ref >60)
GFR Non-African American: >60 (ref >60)
Globulin: 3.9 g/dL — ABNORMAL HIGH (ref 2.3–3.5)
Glucose: 112 mg/dL — ABNORMAL HIGH (ref 70–99)
Potassium reflex Magnesium: 4.8 mEq/L (ref 3.4–4.9)
Sodium: 132 mEq/L — ABNORMAL LOW (ref 135–144)
Total Bilirubin: 0.6 mg/dL (ref 0.2–0.7)
Total Protein: 6.9 g/dL (ref 6.3–8.0)

## 2018-06-11 LAB — CULTURE, BODY FLUID: Body Fluid Culture, Sterile: NO GROWTH

## 2018-06-11 MED ORDER — LIDOCAINE HCL 2 % IJ SOLN
2 % | Freq: Once | INTRAMUSCULAR | Status: AC
Start: 2018-06-11 — End: 2018-06-11
  Administered 2018-06-11: 18:00:00 5 mL via INTRADERMAL

## 2018-06-11 MED ORDER — SODIUM CHLORIDE 0.9 % IV SOLN
0.9 % | Freq: Once | INTRAVENOUS | Status: AC
Start: 2018-06-11 — End: 2018-06-11
  Administered 2018-06-11: 18:00:00 250 mL via INTRAVENOUS

## 2018-06-11 MED ORDER — METHOCARBAMOL 500 MG PO TABS
500 MG | Freq: Four times a day (QID) | ORAL | Status: DC | PRN
Start: 2018-06-11 — End: 2018-06-13
  Administered 2018-06-11 – 2018-06-12 (×2): 500 mg via ORAL

## 2018-06-11 MED ORDER — GABAPENTIN 100 MG PO CAPS
100 MG | Freq: Once | ORAL | Status: AC
Start: 2018-06-11 — End: 2018-06-11
  Administered 2018-06-11: 23:00:00 200 mg via ORAL

## 2018-06-11 MED ORDER — NORMAL SALINE FLUSH 0.9 % IV SOLN
0.9 % | Freq: Two times a day (BID) | INTRAVENOUS | Status: DC
Start: 2018-06-11 — End: 2018-06-16
  Administered 2018-06-12 – 2018-06-16 (×10): 10 mL via INTRAVENOUS

## 2018-06-11 MED ORDER — VANCOMYCIN (VANCOCIN) INFUSION (OUTPATIENT ONLY)
Freq: Two times a day (BID) | 0 refills | Status: DC
Start: 2018-06-11 — End: 2018-06-12

## 2018-06-11 MED ORDER — HYDROMORPHONE HCL 1 MG/ML IJ SOLN
1 MG/ML | INTRAMUSCULAR | Status: DC | PRN
Start: 2018-06-11 — End: 2018-06-16
  Administered 2018-06-11 – 2018-06-16 (×26): 1 mg via INTRAVENOUS

## 2018-06-11 MED ORDER — OXYCODONE HCL 5 MG PO TABS
5 MG | ORAL | Status: DC | PRN
Start: 2018-06-11 — End: 2018-06-16
  Administered 2018-06-11 – 2018-06-16 (×22): 5 mg via ORAL

## 2018-06-11 MED ORDER — NORMAL SALINE FLUSH 0.9 % IV SOLN
0.9 % | INTRAVENOUS | Status: DC | PRN
Start: 2018-06-11 — End: 2018-06-16
  Administered 2018-06-13: 08:00:00 10 mL via INTRAVENOUS

## 2018-06-11 MED ORDER — GABAPENTIN 100 MG PO CAPS
100 MG | Freq: Three times a day (TID) | ORAL | Status: DC
Start: 2018-06-11 — End: 2018-06-16
  Administered 2018-06-12 – 2018-06-16 (×14): 100 mg via ORAL

## 2018-06-11 MED ORDER — SODIUM CHLORIDE 0.9 % IV SOLN
0.9 | INTRAVENOUS | Status: AC
Start: 2018-06-11 — End: 2018-06-11

## 2018-06-11 MED FILL — ONDANSETRON HCL 4 MG/2ML IJ SOLN: 4 MG/2ML | INTRAMUSCULAR | Qty: 2

## 2018-06-11 MED FILL — ORPHENADRINE CITRATE ER 100 MG PO TB12: 100 mg | ORAL | Qty: 1

## 2018-06-11 MED FILL — GABAPENTIN 100 MG PO CAPS: 100 mg | ORAL | Qty: 2

## 2018-06-11 MED FILL — DILAUDID 1 MG/ML IJ SOLN: 1 mg/mL | INTRAMUSCULAR | Qty: 0.5

## 2018-06-11 MED FILL — SPIRONOLACTONE 50 MG PO TABS: 50 mg | ORAL | Qty: 2

## 2018-06-11 MED FILL — LACTULOSE 10 GM/15ML PO SOLN: 10 GM/15ML | ORAL | Qty: 30

## 2018-06-11 MED FILL — HYDROMORPHONE HCL 1 MG/ML IJ SOLN: 1 mg/mL | INTRAMUSCULAR | Qty: 1

## 2018-06-11 MED FILL — NICOTINE 21 MG/24HR TD PT24: 21 mg/(24.h) | TRANSDERMAL | Qty: 1

## 2018-06-11 MED FILL — MELATONIN 1 MG PO TABS: 1 mg | ORAL | Qty: 1

## 2018-06-11 MED FILL — VANCOMYCIN HCL 5 G IV SOLR: 5 g | INTRAVENOUS | Qty: 1500

## 2018-06-11 MED FILL — METHOCARBAMOL 500 MG PO TABS: 500 mg | ORAL | Qty: 1

## 2018-06-11 MED FILL — SODIUM CHLORIDE 0.9 % IV SOLN: 0.9 % | INTRAVENOUS | Qty: 250

## 2018-06-11 MED FILL — LOVENOX 40 MG/0.4ML SC SOLN: 40 MG/0.4ML | SUBCUTANEOUS | Qty: 0.4

## 2018-06-11 MED FILL — OXYCODONE HCL 5 MG PO TABS: 5 mg | ORAL | Qty: 1

## 2018-06-11 MED FILL — MONOJECT FLUSH SYRINGE 0.9 % IV SOLN: 0.9 % | INTRAVENOUS | Qty: 10

## 2018-06-11 MED FILL — LIDOCAINE PAIN RELIEF 4 % EX PTCH: 4 % | CUTANEOUS | Qty: 3

## 2018-06-11 MED FILL — PANTOPRAZOLE SODIUM 40 MG PO TBEC: 40 mg | ORAL | Qty: 1

## 2018-06-11 MED FILL — FUROSEMIDE 40 MG PO TABS: 40 mg | ORAL | Qty: 1

## 2018-06-11 NOTE — Progress Notes (Signed)
Infectious Diseases Inpatient Progress Note          HISTORY OF PRESENT ILLNESS:  Follow up MSSA bacteremia with Flu like sxs and decompensated liver cirrhosis. Had 9 lit paracentesis, has abdominal soreness, cough.  on IV Vanco and Levaquin, well tolerated. Patient had remarkable clinical improvement with decreased fevers. Blood Cx + MSSA.    Current Medications:    ??? lidocaine  5 mL Intradermal Once   ??? sodium chloride flush  10 mL Intravenous 2 times per day   ??? furosemide  40 mg Oral Daily   ??? ferrous sulfate  325 mg Oral Daily with breakfast   ??? orphenadrine  100 mg Oral BID   ??? melatonin  10 mg Oral Daily   ??? spironolactone  100 mg Oral Daily   ??? pantoprazole  40 mg Oral QAM AC   ??? lidocaine  3 patch Transdermal Daily   ??? vancomycin (VANCOCIN) intermittent dosing Leisure centre manager)   Other RX Placeholder   ??? vancomycin  1,500 mg Intravenous Q12H   ??? sodium chloride flush  10 mL Intravenous 2 times per day   ??? enoxaparin  40 mg Subcutaneous Daily   ??? nicotine  1 patch Transdermal Daily   ??? lactulose  20 g Oral TID   ??? tenofovir  300 mg Oral Daily       Allergies:  Nubain [nalbuphine]; Morphine; Penicillin g; and Tylenol [acetaminophen]      Review of Systems  14 system review is negative other than HPI    Physical Exam   Vitals:    06/10/18 0823 06/10/18 1815 06/10/18 2005 06/11/18 0047   BP: 112/68  (!) 96/53 113/75   Pulse: 71  68 66   Resp: 17  18    Temp: 97.9 ??F (36.6 ??C)  97.9 ??F (36.6 ??C) 97.3 ??F (36.3 ??C)   TempSrc: Oral  Oral    SpO2: 96%  100% 100%   Weight:  216 lb (98 kg)     Height:           General Appearance: alert and oriented to person, place and time, well-developed and well-nourished, in no acute distress  Skin: warm and dry, no rash.   Head: normocephalic and atraumatic  Eyes: extraocular eye movements intact, conjunctivae normal, icteric sclerae  ENT: oropharynx clear and moist with normal mucous membranes. No thrush  Lungs: normal respiratory effort, Clear Lungs, no rhonchi, no crackles, no  wheezes  Heart:RRR, nl S1/S2, no murmur  Abdomen: soft, + decreased tenderness, distended with decreased ascites, + BS  NEUROLOGICAL: alert and oriented x 3, no focal deficits  +2 B leg edema  No erythema, no warmth, no tenderness    DATA:    Lab Results   Component Value Date    WBC 2.1 (L) 06/10/2018    HGB 7.2 (L) 06/10/2018    HCT 23.0 (L) 06/10/2018    MCV 69.4 (L) 06/10/2018    PLT 129 (L) 06/10/2018     Lab Results   Component Value Date    CREATININE 0.94 06/10/2018    BUN 15 06/10/2018    NA 129 (L) 06/10/2018    K 3.9 06/10/2018    K 3.9 06/10/2018    CL 97 06/10/2018    CO2 19 (L) 06/10/2018       Hepatic Function Panel:  Lab Results   Component Value Date    ALKPHOS 125 06/10/2018    ALT 24 06/10/2018    AST 51 06/10/2018  PROT 6.3 06/10/2018    BILITOT 0.6 06/10/2018    LABALBU 2.7 06/10/2018    LABALBU 3.1 05/31/2016       Microbiology:   Recent Labs     06/09/18  0611   BC No Growth to date.  Any change in status will be called.     Recent Labs     06/09/18  1423   BLOODCULT2 No Growth to date.  Any change in status will be called.   Summary   Limited echo for evaluation for endocarditis.   No obvious valve vegetations or regurgitation.   Only fair quality study, could miss small vegetation.   Consider alternate modality if clinically indicated like TEE.   Signature       IMPRESSION:    ?? MSSA bacteremia, no obvious source  ?? Decompensated liver cirrhosis with reaccumulating fluid  ?? Acute bronchitis, Flu like illness, no pneumonia    Patient Active Problem List   Diagnosis   ??? Recurrent incisional hernia   ??? Cirrhosis (HCC)   ??? Generalized abdominal pain   ??? Ascites of liver   ??? Visceral hyperalgesia   ??? Generalized muscle ache       PLAN:  ?? PICC  ?? D/C to SNF on 2 weeks IV Vanco  ?? F/U Vanco trough and BMP  ?? Recommend F/U with Hepatology    Discussed with patient    Eudelia Bunch, MD

## 2018-06-11 NOTE — Care Coordination-Inpatient (Signed)
Discussed patient w/ Dr. Ruben Reason. States patient okay to discharge from her standpoint once cleared by other physicians and notified patient will return to [facility]. Plan IVs at discharge. Per RN PICC planned for today.

## 2018-06-11 NOTE — Progress Notes (Signed)
Physical Therapy Missed Treatment   Facility/Department: [facility] [id number]    NAME: Travis Ortega    DOB: [birth year] (48 y.o.)  MRN: [id number]    Account: [id number]  Gender: male    Chart reviewed, attempted PT at 15:10. Patient unavailable 2?? to:    []  Hold per nsg request    [x]  Pt declined d/t fatigue. "I just got back from Sx and my arm is killing me."  Pt had PICC line placement. Pt reports walking the halls with his walker 4 times today.     []  Nsg notified   [x]  Other notified    []  Pt.. off floor for test/procedure.     []  Pt. Unavailable        Will attempt PT treatment again at earliest convenience.      Electronically signed by Verdon Cummins, PTA on 06/11/18 at 3:16 PM

## 2018-06-11 NOTE — Progress Notes (Signed)
PROGRESS NOTE -PAIN MANAGEMENT     SERVICE DATE:  06/11/2018   SERVICE TIME:  5:57 PM    Called and reconsult on the patient by the hospital team due to evolving new problem during patient evaluation.    CHIEFCOMPLAINT: Patient is been complaining of severe intractable neck pain, right upper extremity severe weakness.  He was admitted due to severe arthritis, has severe abdominal pain and is still complaining of recurrent abdominal pain and distention and reaccumulating of the ascites in addition also severe scrotal pain.    SUBJECTIVE:  Travis Ortega is a 48 y.o. 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  Patient has been receiving intensive antibiotic course also been receiving supportive treatment also is going to have another paracentesis tomorrow due to rapidly accumulating ascites and scrotal swelling.    Patient states  pain is persistent, severe, not controlled with the current prescribed medication, I was called to come and reconsult with the patient trying to evaluate another pain management plan for him.    PAIN  ASSESSMENT:    constant, waxing and waning    aching, pulsating, shooting, stabbing, throbbing and tight band    pain is excruciating , incapacitating and unbearable (9-10 pain scale)      MEDICATIONS:    Current Facility-Administered Medications   Medication Dose Route Frequency Provider Last Rate Last Dose   . sodium chloride flush 0.9 % injection 10 mL  10 mL Intravenous 2 times per day Rita A Abbud, MD       . sodium chloride flush  0.9 % injection 10 mL  10 mL Intravenous PRN Rita A Abbud, MD       . oxyCODONE (ROXICODONE) immediate release tablet 5 mg  5 mg Oral Q4H PRN Earna Coder, MD       . HYDROmorphone (DILAUDID) injection 1 mg  1 mg Intravenous Q4H PRN Earna Coder, MD       . methocarbamol (ROBAXIN) tablet 500 mg  500 mg Oral Q6H PRN Earna Coder, MD       . gabapentin (NEURONTIN) capsule 200 mg  200 mg Oral Once Earna Coder, MD       . Melene Muller ON 06/12/2018] gabapentin (NEURONTIN) capsule 100 mg  100 mg Oral TID Earna Coder, MD       . furosemide (LASIX) tablet 40 mg  40 mg Oral Daily Lowry Bowl, DO   40 mg at 06/11/18 0945   . ferrous sulfate tablet 325 mg  325 mg Oral Daily with breakfast Lowry Bowl, DO       . melatonin tablet 10 mg  10 mg Oral Daily Lowry Bowl, DO   10 mg at 06/10/18 1956   . hydrOXYzine (ATARAX) tablet 25 mg  25 mg Oral Q6H PRN Lowry Bowl, DO   25 mg at 06/10/18 1325   . spironolactone (ALDACTONE) tablet 100 mg  100 mg Oral Daily Mirian Capuchin, MD   100 mg at 06/11/18 0945   . pantoprazole (PROTONIX) tablet 40 mg  40 mg Oral QAM AC Yazid R  Hussein, DO   40 mg at 06/11/18 0454   . lidocaine 4 % external patch 3 patch  3 patch Transdermal Daily Earna Coder, MD   3 patch at 06/11/18 0945   . vancomycin (VANCOCIN) intermittent dosing Le[employer] manager)   Other RX Placeholder Rita A Abbud, MD       . vancomycin (VANCOCIN) 1,500 mg in dextrose 5 % 500 mL IVPB  1,500 mg Intravenous Q12H Rita A Abbud, MD 250 mL/hr at 06/11/18 1623 1,500 mg at 06/11/18 1623   . sodium chloride flush 0.9 % injection 10 mL  10 mL Intravenous 2 times per day Rose Fillers, APRN - CNP   10 mL at 06/10/18 1958   . sodium chloride flush 0.9 % injection 10 mL  10 mL Intravenous PRN Rose Fillers, APRN - CNP       . polyethylene glycol (GLYCOLAX) packet 17 g  17 g Oral Daily PRN Rose Fillers, APRN - CNP       . promethazine (PHENERGAN) tablet 12.5 mg  12.5 mg Oral Q6H PRN Rose Fillers, APRN - CNP       . ondansetron (ZOFRAN)  injection 4 mg  4 mg Intravenous Q6H PRN Rose Fillers, APRN - CNP   4 mg at 06/11/18 1623   . enoxaparin (LOVENOX) injection 40 mg  40 mg Subcutaneous Daily Rose Fillers, APRN - CNP   Stopped at 06/11/18 2100   . nicotine (NICODERM CQ) 21 MG/24HR 1 patch  1 patch Transdermal Daily Rose Fillers, APRN - CNP   1 patch at 06/11/18 0945   . lactulose (CHRONULAC) 10 GM/15ML solution 20 g  20 g Oral TID Rose Fillers, APRN - CNP   20 g at 06/11/18 0945   . tenofovir (VIREAD) tablet 300 mg  300 mg Oral Daily Rose Fillers, APRN - CNP             ALLERGIES:  Nubain [nalbuphine]; Morphine; Penicillin g; and Tylenol [acetaminophen]    Review of Systems   Constitutional: Positive for activity change, appetite change, fatigue and unexpected weight change. Negative for chills, diaphoresis and fever.   HENT: Negative.    Eyes: Negative.    Respiratory: Negative.    Cardiovascular: Negative.    Gastrointestinal: Negative.    Genitourinary: Positive for decreased urine volume, difficulty urinating, flank pain, penile pain, penile swelling, scrotal swelling and testicular pain. Negative for discharge, dysuria, enuresis, frequency, genital sores, hematuria and urgency.   Musculoskeletal: Positive for arthralgias, back pain, gait problem, joint swelling, myalgias, neck pain and neck stiffness.   Allergic/Immunologic: Positive for immunocompromised state. Negative for environmental allergies and food allergies.   Neurological: Positive for tremors, weakness, light-headedness, numbness and headaches. Negative for dizziness, seizures, syncope, facial asymmetry and speech difficulty.   Hematological: Negative for adenopathy. Bruises/bleeds easily.   Psychiatric/Behavioral: Positive for decreased concentration and sleep disturbance. Negative for agitation, behavioral problems, confusion, dysphoric mood, hallucinations, self-injury and suicidal ideas. The patient is nervous/anxious. The patient is not hyperactive.            OBJECTIVE  PHYSICAL  EXAM:  BP 113/75   Pulse 66   Temp 97.3 F (36.3 C)   Resp 18   Ht  (1.854 m)   Wt 216 lb (98 kg)   SpO2 100%   BMI 28.50 kg/m   Body mass index is 28.5 kg/m.  CONSTITUTIONAL: Patient seem to be awake however alert cooperative severe distress due to the pain, he appears to be lethargic also  having difficulty straightening his neck  EYES:  vision intact  ENT:  normocepalic, without obvious abnormality, atraumatic  NECK: Severe intractable neck pain, severe point tenderness across the cervical spine at the C4, C5-C6 and C7, difficulty and restricted range of motion with cervical extension flexion side motion and lateral flexion.,  Patient has positive Spurling sign on the right, also has motor and sensory weakness very evident on the right.,  Right upper extremity.  BACK:  Thoracolumbar spine appears to be generally tender throughout however no spinous process tenderness, range of motion mildly restricted due to patient habitus of his abdominal distention due to ascites   LUNGS:  tachypneic, Decreased pattern of breathing due to the abdominal distention accumulation of ascites and Diminished air exchange at the base  CARDIOVASCULAR:  tachycardic with regular rhythm  ABDOMEN: Moderate to severe abdominal distention, bilateral flank tenderness, bilateral costophrenic angle tenderness, scrotal swelling and abdominal swelling more evident.  MUSCULOSKELETAL:  Markedly decreased range of motion of the cervical spine, decreased range of motion of the shoulder, weakness across the elbow and shoulder of the right  NEUROLOGIC: Neurological exam reveals mental status is normal, cranial nerves exam is normal however patient has markedly reduced C5, C6, C6 and C7 motor weakness 4/on the right upper extremity5, noted decreased sensory on the right upper extremity  SKIN:  no bruising or bleeding, slightly pale and jaundiced on his exam and slightly icteric  DATA:   Diagnostic tests reviewed for today's visit:    All  labs and imaging results reviewed.     Lab Results   Component Value Date    WBC 2.8 06/11/2018    HGB 7.7 06/11/2018    HCT 24.3 06/11/2018    MCV 69.2 06/11/2018    PLT 141 06/11/2018    NA 132 06/11/2018    K 4.8 06/11/2018    CL 99 06/11/2018    CO2 22 06/11/2018    BUN 19 06/11/2018    CREATININE 1.07 06/11/2018    CALCIUM 8.2 06/11/2018    ALKPHOS 147 06/11/2018    ALT 27 06/11/2018    AST 48 06/11/2018    BILITOT 0.6 06/11/2018    LABALBU 3.0 06/11/2018    LABALBU 3.1 05/31/2016   LABS  Recent Labs     06/09/18  0611 06/10/18  0657 06/11/18  1412   WBC 2.3* 2.1* 2.8*   RBC 3.43* 3.31* 3.52*   HGB 7.6* 7.2* 7.7*   HCT 23.8* 23.0* 24.3*   MCV 69.5* 69.4* 69.2*   MCH 22.0* 21.6* 21.9*   MCHC 31.7* 31.2* 31.7*   RDW 22.8* 23.0* 23.2*   PLT 121* 129* 141       Recent Labs     06/09/18  0611 06/10/18  0657 06/11/18  1412   NA 130* 129* 132*   K 4.1 3.9  3.9 4.8   CL 96 97 99   CO2 21 19* 22   BUN 20 15 19    CREATININE 1.01 0.94 1.07   GLUCOSE 121* 132* 112*   CALCIUM 8.1* 8.3* 8.2*       No results for input(s): MG in the last 72 hours.      Xr Chest Standard (2 Vw)    Result Date: 06/08/2018  Patient MRN: [id number] DOB: September 23, 1970 Age:  46 years Gender: Male Order Date: 06/08/2018 1:30 PM. Exam: XR CHEST (2 VW) Number of Views: 2 Indication:  High-grade fever, sore throat cough and severe neck pain x3 days. Cough and  fever. Comparison: None Findings: Cardiomediastinal silhouette is within normal limits. Lungs are clear without evidence of infiltrate/pneumonia, pneumothorax or pleural effusion     Impression:  No radiographic evidence of acute cardiopulmonary disease     Ct Abdomen Pelvis W Iv Contrast Additional Contrast? None    Result Date: 06/08/2018  Patient MRN: [id number] DOB: [birth year] Age:  41 years Order Date: 06/07/2018 4:30 PM. Gender: Male Exam: CT ABDOMEN PELVIS W IV CONTRAST Number of Images: 603 Indication:   Abdominal pain, swelling, testicular swelling, history of cirrhosis Contrast dosage: Isovue-370,  100 mL, IV Comparison: None Findings: This examination was performed on a CT scanner with dose reduction. Technique: Low-dose CT  acquisition technique included one of following options; 1 . Automated exposure control, 2. Adjustment of MA and or KV according to patient's size or 3. Use of iterative reconstruction. Visualized lung bases demonstrate no evidence of noncalcified pulmonary nodule, spiculated mass, pneumothorax, hemothorax, pleural effusion or focal areas of airspace consolidation.. Large volume intraperitoneal ascites with a nodular contour of the shrunken appearing liver. Splenomegaly. Mild sized hiatal hernia. Esophageal varices noted. Pancreas, adrenal glands and kidneys unremarkable. Appendix not identified. Fluid extends into a large right inguinal hernia and into the scrotum where there are large bilateral fluid collections. No definitive evidence of bowel obstruction or free intraperitoneal air. Delayed imaging sequences demonstrate there is partial opacification of the renal, ureteral or bladder areas. No large filling defect or mass is seen. No contrast extravasation outside normal renal, ureteral or bladder confines. There are portions of the  renal, ureteral or bladder systems are not opacified and are therefore not evaluated.. Vertebral body heights and intervertebral disc spaces are well-preserved. There is normal sagittal alignment of the visualized spine. No lytic or blastic bony lesions..     1. Findings consistent with decompensated cirrhosis with large volume intraperitoneal ascites, a shrunken liver with nodular contour, splenomegaly and esophageal variceal formation. 2. No evidence of bowel obstruction. 3. There is a large fluid-filled right inguinal hernia with large amounts of fluid extending within the scrotum.             MRI CERVICAL SPINE WO CONTRAST [[id number]] Collected: 06/10/18 1737    Order Status: Completed Updated: 06/10/18 1805    Narrative:     Patient MRN:  [id number]    DOB: 05-06-1970    Age: 53 years    Gender: Male    Order Date: 06/10/2018 5:15 PM.     Exam: MRI CERVICAL SPINE WO CONTRAST, MRI THORACIC SPINE WO CONTRAST, MRI LUMBAR SPINE WO CONTRAST    Number of Views: 788     Indication: Evaluate for osteomyelitis/discitis/abscess. Neck pain, mid back pain, neck and back pain x1 week.    Comparison: CT abdomen and pelvis 06/07/2018    Findings:  MRI of the cervical spine without contrast examination:  Overall examination is limited by motion artifact and lack of IV contrast administration.    There is STIR hyperintensity noted at the vertebral bodies of C5 and C6. There is normal sagittal alignment cervical spine.    C2-C3: No evidence of posterior disc contour abnormality, spinal canal stenosis, neural foraminal narrowing or spinal nerve root abutment/impingement..    C3-C4: Posterior disc osteophyte complex, bilateral uncinate hypertrophy, moderate bilateral neural foraminal narrowing. No frank spinal canal stenosis.    C4-C5: Posterior disc osteophyte complex, intervertebral disc space narrowing, with bilateral uncinate hypertrophy. Moderate to moderately severe bilateral neural foraminal narrowing. No frank spinal canal stenosis.    C5-C6:  Intervertebral disc space narrowing. There is mildly elevated signal intensity at the intervertebral disc which appears asymmetric. Again, the examination is limited by the motion artifact. Posterior disc osteophyte complex, bilateral uncinate   hypertrophy with moderate to moderately severe bilateral neural foraminal narrowing. No frank spinal canal stenosis.    C6-C7: Posterior disc osteophyte complex, bilateral uncinate hypertrophy, moderately severe bilateral neural foraminal narrowing. Moderate spinal canal stenosis.    C7-T1: Asymmetric disc osteophyte complex the right neural foraminal region limited in evaluation... ` Moderate to moderately severe right neural foraminal narrowing. No frank spinal canal  stenosis.    There is no evidence of an epidural fluid collection or abscess.    MRI of the thoracic spine without contrast examination:  Overall examination is limited by motion artifact and lack of IV contrast administration.    No STIR hyperintensity to just acute fracture or ligamentous injury. Normal sagittal alignment thoracic spine. Bone marrow signal intensity appears relatively homogeneous. Intrathoracic structures are nondiagnostic. Spleen appears to be enlarged but is   incompletely evaluated. Liver is nondiagnostic. Adrenal glands grossly unremarkable. Suspected right renal cyst incompletely characterized. Aorta is grossly unremarkable limited visualization. No evidence of an epidural fluid collection or abscess. No   definitive evidence of restricted diffusion on this severely motion degraded exam within the thoracic spine.    T1-T2: No evidence of posterior disc contour abnormality, spinal canal stenosis, neural foraminal narrowing or spinal nerve root abutment/impingement..    T2-T3: No evidence of posterior disc contour abnormality, spinal canal stenosis, neural foraminal narrowing or spinal nerve root abutment/impingement..    T3-T4: No evidence of posterior disc contour abnormality, spinal canal stenosis, neural foraminal narrowing or spinal nerve root abutment/impingement..    T4-T5: No evidence of posterior disc contour abnormality, spinal canal stenosis, neural foraminal narrowing or spinal nerve root abutment/impingement..    T5-T6: Small right paracentral disc protrusion is noted. No frank spinal canal stenosis. No neural foraminal narrowing.    T6-T7: There is a central disc protrusion that measures approximately 0.6 cm anterior posterior by 0.56 cm transverse with mild mass effect on the ventral thoracic spinal cord. No neural foraminal narrowing.    T7-T8: No evidence of posterior disc contour abnormality, spinal canal stenosis, neural foraminal narrowing or spinal nerve root  abutment/impingement..    T8-T9: No evidence of posterior disc contour abnormality, spinal canal stenosis, neural foraminal narrowing or spinal nerve root abutment/impingement..    T9-T10: No evidence of posterior disc contour abnormality, spinal canal stenosis, neural foraminal narrowing or spinal nerve root abutment/impingement..    T10-T11: No evidence of posterior disc contour abnormality, spinal canal stenosis, neural foraminal narrowing or spinal nerve root abutment/impingement..    T11-T12: Moderate circumferential disc bulge. No frank spinal canal stenosis.    MRI of the lumbar spine without contrast examination:  Overall examination is limited by motion artifact and lack of IV contrast administration.    No evidence of STIR hyperintensity to suggest acute fracture or ligamentous injury. Conus medullaris terminates at approximately the T12-L1 motion segment level. Possible right renal cyst. Other intra-abdominal organs are nondiagnostic. No evidence of an  epidural fluid collection or abscess is seen. No evidence of restricted diffusion on this severely degraded exam by motion artifact within the lumbar spine.    L1-L2: Mild to moderate circumferential disc bulge. Mild left and right neural foraminal narrowing. No evidence of spinal canal stenosis or spinal nerve abutment/impingement.    L2-L3: Mild to moderate circumferential disc bulge. Mild to moderate left  and right neural foraminal narrowing. No evidence of spinal canal stenosis or spinal nerve abutment/impingement.    L3-L4: Moderate moderate circumferential disc bulge, moderate moderate bilateral facet osteoarthropathy, moderate left and right neural foraminal narrowing. No evidence of spinal canal stenosis or spinal nerve abutment/impingement.    L4-L5: Moderate to moderately large circumferential disc bulge with moderate to moderately severe bilateral facet osteoarthropathy. Moderate left and right neural foraminal narrowing. No evidence of spinal  canal stenosis or spinal nerve   abutment/impingement. There is a left joint effusion at the L4-L5 articulating facet.    L5-S1: Moderate circumferential disc bulge with a superimposed central and left paracentral disc protrusion. Moderate left and moderate to moderately severe right neural foraminal narrowing with abutment of the exiting right L5 spinal nerve root by the   facet osteoarthropathy. Moderate bilateral facet osteoarthropathy. No frank spinal canal stenosis.    Impression:     Overall examination of the cervical, thoracic and lumbar spine is limited in interpretation evaluation by motion artifact and lack of IV contrast administration.    MRI of the cervical spine:  1. Limited examination secondary to motion artifact and lack of IV contrast administration.  2. There is STIR hyperintensity noted within the vertebral bodies of C5 and C6 with asymmetrically increased hyperintensity within the C5-C6 intervertebral disc space, findings are suspicious for osteomyelitis/discitis the C5-C6 intervertebral disc and   the C5 and C6 vertebral bodies.  3. Degenerative changes noted C3-C7, with neural foraminal narrowing ranging from moderate to moderately severe. Moderate spinal canal stenosis is noted at C6-C7. Please see above for level by level details.    MRI of the thoracic spine:  1. Limited examination secondary to motion artifact and lack of IV contrast administration.  2. There is no evidence of osteomyelitis, discitis or abscess within the MRI thoracic spine exam, given limitations imposed on interpretation and evaluation by lack of IV contrast administration.  3. Small T5-T6 right paracentral disc protrusion without frank spinal canal stenosis.  4. Central T6-T7 disc protrusion measuring approximately 0.6 cm anterior posterior by 0.56 cm transverse, with mild mass effect on the ventral thoracic spinal cord.  5. Moderate circumferential disc bulge at T11-T12. See above for other level by level  evaluation.    MRI of the lumbar spine:  1. Limited examination secondary to motion artifact and lack of IV contrast administration.  2. No evidence of osteomyelitis, discitis or abscess within the MRI lumbar spine exam, given limitations imposed on interpretation and evaluation by lack of IV contrast administration.  3. Multilevel degenerative disc disease and facet osteoarthropathy L1-S1, with possible abutment of the exiting right L5 spinal nerve root at L5-S1 level, by the facet osteoarthropathy. No spinal canal stenosis identified. Multilevel neural foraminal   narrowing noted ranging from mild to moderate to moderately severe, see above for level by level details.  4. Joint effusion at the left L4-L5 facet articulation.         ASSESSMENT & PLAN    Active Hospital Problems    Diagnosis Date Noted   . MSSA bacteremia [R78.81] 06/11/2018   . Osteomyelitis of cervical vertebra (HCC) [M46.22] 06/11/2018   . Cervicalgia [M54.2] 06/11/2018   . 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 Degeneration of thoracic intervertebral disc [M51.34] 06/11/2018   . 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 Muscle weakness of upper extremity [M62.81] 06/11/2018   . Generalized abdominal pain [R10.84] 06/08/2018   . Ascites of liver [R18.8] 06/08/2018   . Visceral hyperalgesia [R19.8] 06/08/2018   . Generalized muscle ache [M79.10] 06/08/2018   . 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  Patient appears to be have morbid liver disease, high comorbidity, high risk for surgical intervention, been developing severe intractable increasing pain, heart control with non-opioid, heart control with lower dose opioids.    At this point given patient rapidly changes of his condition, I would not believe that he might be an easy surgical candidate however appropriate pain control and physical therapy and antibiotic preparation and supplementation would be the best choice, discussed appropriate pain management protocol under close observation given his history would be the best choice for managing his pain should be able to develop some plan with physical therapy, however other opinion regarding surgical intervention will be up to the surgical team which will be seeing the patient tonight.      RECOMMENDATION:  SEE ORDERS    We will discontinue the Ultram due to lack of effect, patient is not candidate for nonsteroidal anti-inflammatory, patient is not candidate for Tylenol due to his office comorbidity.,  He is also high risk of bleeding will avoid all anti-inflammatory.    At this time will start oxycodone 5 mg every 4 hours for pain management which will be the only choice,    Topical application of lidocaine might be not helping but will leave it on place    Switch muscle relaxant from Norflex to Robaxin 500 every 6 hours    I will be adding doses of gabapentin 200 mg tonight and to be titrated to effect to help with his cervical radiculopathy    Aggressive intervention with physical therapy, early intervention will be better to avoid any muscle atrophy, will be providing appropriate pain control, will be also additional some Dilaudid for breakthrough pain.    Discussed case with hospital team and will continue to follow-up with patient plan based on his progress.    Cussed treatment plan with patient, answer all the  question related to the above care.    SIGNATURE: Earna CoderSameh R Dareon Nunziato, MD PATIENT NAME:  Travis Ortega   DATE: June 11, 2018 MRN: [id number]   TIME: 5:57 PM PAGER/CONTACT #: [phone]

## 2018-06-11 NOTE — Progress Notes (Signed)
Patient has walked the hall a few times tonight, steady gait with walker. Pt is alert and oriented x4, he becomes nauseated at times but he did have dinner tonight. Dilaudid 0.5 mg q4 prn, zofran being used for nausea.

## 2018-06-11 NOTE — Progress Notes (Signed)
Vascular and Interventional Radiology   Carin Hock. Floyde Parkins M.D. Joycelyn Das      Subjective: Travis Ortega is a 48 y.o. male with liver cirrhosis and post paracentesis. Was admited with fevers and chills. Feeling well, denies any fevers or chills. He feels his abdomen starting to fill with fluid, though is not uncomfortable, still not enough fluid to bother him.     Objective:   BP 113/75    Pulse 66    Temp 97.3 ??F (36.3 ??C)    Resp 18    Ht 6\' 1"  (1.854 m)    Wt 216 lb (98 kg)    SpO2 100%    BMI 28.50 kg/m??     Physical:   alert and  not in acute distress    Normocephalic, without obvious abnormality, atraumatic    Neck supple. No adenopathy. Thyroid symmetric, normal size, and without nodularity    normal rate, regular rhythm and no gallops    chest clear, no wheezing, rales, normal symmetric air entry    Abdomen: Slightly distended, soft, non tender, NBS.    Lab:  Recent Labs     06/10/18  0657   WBC 2.1*   RBC 3.31*   HGB 7.2*   HCT 23.0*   MCV 69.4*   MCH 21.6*   MCHC 31.2*   RDW 23.0*   PLT 129*       No results for input(s): INR, PROTIME in the last 72 hours.    Recent Labs     06/09/18  2778 06/10/18  0657   CREATININE 1.01 0.94       Assessment: Travis Ortega is a 48 y.o. male with liver cirrhosis, some mild accumulation os ascites, though not enough yet to bother him or warrant paracentesis.    Plan: If he is uncomfortable tomorrow or there is more fluid, will consider paracentesis if needed.     Carin Hock. Floyde Parkins M.D. Joycelyn Das  06/11/2018,  2:41 PM

## 2018-06-11 NOTE — Progress Notes (Addendum)
Physician Progress Note    06/11/2018   4:25 PM    Name:  Rodrigus Kilker  MRN:    [id number]     IP Day: 4     Admit Date: 06/07/2018  4:23 PM  PCP: Yaakov Guthrie, APRN - CNP    Code Status:  Full Code    Subjective:     He still complains of significant abdominal pain and is asking if his pain medicine frequency can be increased.  He also complains of significant pain in his neck and in his mid back.  He denies chest pain, dyspnea.    Current Facility-Administered Medications   Medication Dose Route Frequency Provider Last Rate Last Dose   ??? sodium chloride flush 0.9 % injection 10 mL  10 mL Intravenous 2 times per day Glenis Smoker Abbud, MD       ??? sodium chloride flush 0.9 % injection 10 mL  10 mL Intravenous PRN Glenis Smoker Abbud, MD       ??? furosemide (LASIX) tablet 40 mg  40 mg Oral Daily Lowry Bowl, DO   40 mg at 06/11/18 0945   ??? ferrous sulfate tablet 325 mg  325 mg Oral Daily with breakfast Lowry Bowl, DO       ??? traMADol (ULTRAM) tablet 50 mg  50 mg Oral Q6H PRN Earna Coder, MD       ??? orphenadrine (NORFLEX) extended release tablet 100 mg  100 mg Oral BID Earna Coder, MD   100 mg at 06/11/18 0945   ??? HYDROmorphone (DILAUDID) injection 0.5 mg  0.5 mg Intravenous Q4H PRN Earna Coder, MD   0.5 mg at 06/11/18 1427   ??? melatonin tablet 10 mg  10 mg Oral Daily Lowry Bowl, DO   10 mg at 06/10/18 1956   ??? hydrOXYzine (ATARAX) tablet 25 mg  25 mg Oral Q6H PRN Lowry Bowl, DO   25 mg at 06/10/18 1325   ??? spironolactone (ALDACTONE) tablet 100 mg  100 mg Oral Daily Mirian Capuchin, MD   100 mg at 06/11/18 0945   ??? pantoprazole (PROTONIX) tablet 40 mg  40 mg Oral QAM AC Yazid R Hussein, DO   40 mg at 06/11/18 2956   ??? lidocaine 4 % external patch 3 patch  3 patch Transdermal Daily Earna Coder, MD   3 patch at 06/11/18 0945   ??? vancomycin (VANCOCIN) intermittent dosing Leisure centre manager)   Other RX Placeholder Eudelia Bunch, MD       ??? vancomycin (VANCOCIN) 1,500 mg in dextrose 5 % 500 mL IVPB  1,500 mg  Intravenous Q12H Rita A Abbud, MD 250 mL/hr at 06/11/18 1623 1,500 mg at 06/11/18 1623   ??? sodium chloride flush 0.9 % injection 10 mL  10 mL Intravenous 2 times per day Rose Fillers, APRN - CNP   10 mL at 06/10/18 1958   ??? sodium chloride flush 0.9 % injection 10 mL  10 mL Intravenous PRN Rose Fillers, APRN - CNP       ??? polyethylene glycol (GLYCOLAX) packet 17 g  17 g Oral Daily PRN Rose Fillers, APRN - CNP       ??? promethazine (PHENERGAN) tablet 12.5 mg  12.5 mg Oral Q6H PRN Rose Fillers, APRN - CNP       ??? ondansetron (ZOFRAN) injection 4 mg  4 mg Intravenous Q6H PRN Rose Fillers, APRN - CNP   4 mg at 06/11/18 1623   ???  enoxaparin (LOVENOX) injection 40 mg  40 mg Subcutaneous Daily Rose Fillers, APRN - CNP   Stopped at 06/11/18 2100   ??? nicotine (NICODERM CQ) 21 MG/24HR 1 patch  1 patch Transdermal Daily Rose Fillers, APRN - CNP   1 patch at 06/11/18 0945   ??? lactulose (CHRONULAC) 10 GM/15ML solution 20 g  20 g Oral TID Rose Fillers, APRN - CNP   20 g at 06/11/18 0945   ??? tenofovir (VIREAD) tablet 300 mg  300 mg Oral Daily Rose Fillers, APRN - CNP           Physical Examination:      Vitals:  BP 113/75    Pulse 66    Temp 97.3 ??F (36.3 ??C)    Resp 18    Ht 6\' 1"  (1.854 m)    Wt 216 lb (98 kg)    SpO2 100%    BMI 28.50 kg/m??   Temp (24hrs), Avg:97.6 ??F (36.4 ??C), Min:97.3 ??F (36.3 ??C), Max:97.9 ??F (36.6 ??C)    General appearance: alert, cooperative and no distress  Mental Status: oriented to person, place and time and normal affect  Back: Point tenderness in C-spine and and T-spine.  Lungs: clear to auscultation bilaterally, normal effort  Heart: regular rate and rhythm, no murmur  Abdomen: Diffuse tenderness to palpation with guarding and mild rebound tenderness.  Soft.  Bowel sounds present.  Extremities: 1+ pitting edema bilaterally  Skin: no gross lesions, rashes  Neuro: Mild asterixis.  Weakness in right upper extremity and right lower extremity.    Data:     Labs:  Recent Labs     06/10/18  0657 06/11/18  1412   WBC  2.1* 2.8*   HGB 7.2* 7.7*   PLT 129* 141     Recent Labs     06/10/18  0657 06/11/18  1412   NA 129* 132*   K 3.9   3.9 4.8   CL 97 99   CO2 19* 22   BUN 15 19   CREATININE 0.94 1.07   GLUCOSE 132* 112*     Recent Labs     06/10/18  0657 06/11/18  1412   AST 51* 48*   ALT 24 27   BILITOT 0.6 0.6   ALKPHOS 125* 147*     MRI of the cervical spine:   1. Limited examination secondary to motion artifact and lack of IV contrast administration.   2. There is STIR hyperintensity noted within the vertebral bodies of C5 and C6 with asymmetrically increased hyperintensity within the C5-C6 intervertebral disc space, findings are suspicious for osteomyelitis/discitis the C5-C6 intervertebral disc and    the C5 and C6 vertebral bodies.   3. Degenerative changes noted C3-C7, with neural foraminal narrowing ranging from moderate to moderately severe. Moderate spinal canal stenosis is noted at C6-C7. Please see above for level by level details.   ??   MRI of the thoracic spine:   1. Limited examination secondary to motion artifact and lack of IV contrast administration.   2. There is no evidence of osteomyelitis, discitis or abscess within the MRI thoracic spine exam, given limitations imposed on interpretation and evaluation by lack of IV contrast administration.   3. Small T5-T6 right paracentral disc protrusion without frank spinal canal stenosis.   4. Central T6-T7 disc protrusion measuring approximately 0.6 cm anterior posterior by 0.56 cm transverse, with mild mass effect on the ventral thoracic spinal cord.   5. Moderate circumferential disc bulge  at T11-T12. See above for other level by level evaluation.   ??   MRI of the lumbar spine:   1. Limited examination secondary to motion artifact and lack of IV contrast administration.   2. No evidence of osteomyelitis, discitis or abscess within the MRI lumbar spine exam, given limitations imposed on interpretation and evaluation by lack of IV contrast administration.   3. Multilevel  degenerative disc disease and facet osteoarthropathy L1-S1, with possible abutment of the exiting right L5 spinal nerve root at L5-S1 level, by the facet osteoarthropathy. No spinal canal stenosis identified. Multilevel neural foraminal    narrowing noted ranging from mild to moderate to moderately severe, see above for level by level details.   4. Joint effusion at the left L4-L5 facet articulation.         Assessment and Plan:        48 year old male with cirrhosis (Hep B/C, and h/o heavy etOH use) presented with worsening abdominal pain and scrotal swelling.    1.  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  Possible trigger- MSSA infection  -Ascitic studies negative for SBP  -lasix 40 / aldactone 100 - will consider increasing dose if hemodynamics can tolerate  -Continue lactulose  -plan for repeat therapeutic paracentesis 2/27  -TIPS evaluation at [facility] after treatment of infection complete  -Pain management following    2. 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 No obvious vegetation on TTE. Repeat BCx clear  -will discuss Abx with ID- likely will need 6 week course for vertebral OM/discitis    3.  Iron deficiency anemia: Continue PPI.  Supplement iron    4. RUE and RLE weakness and sensory loss in setting of C5-6 OM, T5-7 disc protrusion with mild mass effect, and L5-S1 narrowing  - continue PT/OT, pain control  - will ask neuro opinion if there is any other intervention    Diet: DIET GENERAL; Low Sodium (2 GM)  Ppx: lovenox  Full Code    Dispo: [facility] when medically stable- anticipate tomorrow    Electronically signed by Lowry Bowl, DO on 06/11/2018 at 4:25 PM

## 2018-06-11 NOTE — Progress Notes (Signed)
Assessment documented. Medicated for 10/10 pain in neck and backl. Ambulating well in halls with walker. Denies n/v at this time. No other needs at this time. Will continue to monitor. Electronically signed by Merri Brunette, RN on 06/12/2018 at 12:01 AM

## 2018-06-11 NOTE — Discharge Instructions (Addendum)
Continuity of Care Form    Patient Name: Travis Ortega   DOB:  19-Jan-1971  MRN:  [id number]    Admit date:  06/07/2018    Discharge date:  *06/16/18**    Code Status Order: Full Code   Advance Directives:   Advance Care Flowsheet Documentation     Date/Time Healthcare Directive Type of Healthcare Directive Copy in Chart Healthcare Agent Appointed Healthcare Agent's Name Healthcare Agent's Phone Number    06/07/18 2227  No, patient does not have an advance directive for healthcare treatment -- -- -- -- --          Admitting Physician:  Pearletha Furl, DO  PCP: Yaakov Guthrie, APRN - CNP    Discharging Nurse: Lala Lund Unit/Room#: Z660/Y301-60  Discharging Unit Phone Number: [phone]    Emergency Contact:   Extended Emergency Contact Information  Primary Emergency Contact: Carolyn Stare [place]  Home Phone: [phone]  Relation: Other  Secondary Emergency Contact: Dionicio Stall [place]  Home Phone: [phone]  Relation: Parent    Past Surgical History:  Past Surgical History:   Procedure Laterality Date   . HERNIA REPAIR     . HERNIA REPAIR  07/09/2016   . KNEE SURGERY Right     arthroscopy    . PARACENTESIS Left 05/28/2018    total of 11,200 cc removed per Dr Floyde Parkins   . PARACENTESIS Left 06/04/2018    Total of per Dr. Floyde Parkins   . PR LAP, INCISIONAL HERNIA REPAIR,REDUCIBLE N/A 07/09/2016    LAPAROSCOPIC INCISIONAL HERNIA REPAIR WITH MESH. POSS OPEN. performed by Loreli Slot, MD at [facility] OR       Immunization History:     There is no immunization history on file for this patient.    Active Problems:  Patient Active Problem List   Diagnosis Code   . Recurrent incisional hernia K43.2   . Cirrhosis (HCC) K74.60   . Generalized abdominal pain R10.84   . Ascites of liver R18.8   . Visceral hyperalgesia R19.8   . Generalized muscle ache M79.10       Isolation/Infection:   Isolation          No Isolation        Patient Infection Status     None to  display          Nurse Assessment:  Last Vital Signs: BP 113/75   Pulse 66   Temp 97.3 F (36.3 C)   Resp 18   Ht 6\' 1"  (1.854 m)   Wt 216 lb (98 kg)   SpO2 100%   BMI 28.50 kg/m     Last documented pain score (0-10 scale): Pain Level: 9  Last Weight:   Wt Readings from Last 1 Encounters:   06/10/18 216 lb (98 kg)     Mental Status:  oriented and alert    IV Access:  - PICC - site  R Upper Arm, insertion date: 06/11/18    Nursing Mobility/ADLs:  Walking   Independent  Transfer  Independent  Bathing  Independent  Dressing  Independent  Toileting  Independent  Feeding  Independent  Med Admin  Assisted  Med Delivery   whole    Wound Care Documentation and Therapy:  Incision 07/09/16 Abdomen (Active)   Number of days: 702        Elimination:  Continence:    Bowel: Yes   Bladder: Yes  Urinary Catheter:  None   Colostomy/Ileostomy/Ileal Conduit: No       Date of Last BM: 06/16/18    Intake/Output Summary (Last 24 hours) at 06/11/2018 1245  Last data filed at 06/11/2018 0424  Gross per 24 hour   Intake 1450 ml   Output 700 ml   Net 750 ml     I/O last 3 completed shifts:  In: 1450 [P.O.:680; I.V.:770]  Out: 700 [Urine:700]    Safety Concerns:     None    Impairments/Disabilities:      None    Nutrition Therapy:  Current Nutrition Therapy:   - Oral Diet:  General    Routes of Feeding: Oral  Liquids: Thin Liquids  Daily Fluid Restriction: no  Last Modified Barium Swallow with Video (Video Swallowing Test): not done    Treatments at the Time of Hospital Discharge:   Respiratory Treatments: ***  Oxygen Therapy:  is not on home oxygen therapy.  Ventilator:    - No ventilator support    Rehab Therapies: {THERAPEUTIC INTERVENTION:8147567273}  Weight Bearing Status/Restrictions: No weight bearing restirctions  Other Medical Equipment (for information only, NOT a DME order):  {EQUIPMENT:304520077}  Other Treatments: ***    Patient's personal belongings (please select all that are sent with patient):  None    RN SIGNATURE:   Electronically signed by Lavella Lemons, RN on 06/16/18 at 1:45 PM    CASE MANAGEMENT/SOCIAL WORK SECTION    Inpatient Status Date: ***    Readmission Risk Assessment Score:  Readmission Risk              Risk of Unplanned Readmission:        11           Discharging to Facility/ Agency    Name: [facility]   Address: [address] [place], [place]  [address]   Phone: [phone]   Fax: [phone]    Dialysis Facility (if applicable)    Name:   Address:   Dialysis Schedule:   Phone:   Fax:    Case Manager/Social Worker signature: Electronically signed by Lelon Frohlich, RN on 06/11/18 at 12:47 PM    PHYSICIAN SECTION    Prognosis: Guarded    Condition at Discharge: Stable    Rehab Potential (if transferring to Rehab): Good    Recommended Labs or Other Treatments After Discharge: cbc and bmp q3d    Physician Certification: I certify the above information and transfer of Travis Ortega  is necessary for the continuing treatment of the diagnosis listed and that he requires Skilled Nursing Facility for less 30 days.     Update Admission H&P: No change in H&P    PHYSICIAN SIGNATURE:  Electronically signed by Lowry Bowl, DO on 06/16/18 at 2:00 PM

## 2018-06-12 ENCOUNTER — Inpatient Hospital Stay: Admit: 2018-06-12 | Discharge: 2018-06-16 | Payer: MEDICARE | Primary: Family

## 2018-06-12 ENCOUNTER — Inpatient Hospital Stay: Admit: 2018-06-12 | Payer: MEDICARE | Primary: Family

## 2018-06-12 DIAGNOSIS — M4622 Osteomyelitis of vertebra, cervical region: Secondary | ICD-10-CM

## 2018-06-12 LAB — COMPREHENSIVE METABOLIC PANEL W/ REFLEX TO MG FOR LOW K
ALT: 26 U/L (ref 0–41)
AST: 49 U/L — ABNORMAL HIGH (ref 0–40)
Albumin: 2.7 g/dL — ABNORMAL LOW (ref 3.5–4.6)
Alkaline Phosphatase: 130 U/L — ABNORMAL HIGH (ref 35–104)
Anion Gap: 12 mEq/L (ref 9–15)
BUN: 22 mg/dL — ABNORMAL HIGH (ref 6–20)
CO2: 21 mEq/L (ref 20–31)
Calcium: 8 mg/dL — ABNORMAL LOW (ref 8.5–9.9)
Chloride: 97 mEq/L (ref 95–107)
Creatinine: 1.32 mg/dL — ABNORMAL HIGH (ref 0.70–1.20)
GFR African American: >60 (ref >60)
GFR Non-African American: 58.5 — ABNORMAL LOW (ref >60)
Globulin: 3.5 g/dL (ref 2.3–3.5)
Glucose: 187 mg/dL — ABNORMAL HIGH (ref 70–99)
Potassium reflex Magnesium: 4.4 mEq/L (ref 3.4–4.9)
Sodium: 130 mEq/L — ABNORMAL LOW (ref 135–144)
Total Bilirubin: 0.6 mg/dL (ref 0.2–0.7)
Total Protein: 6.2 g/dL — ABNORMAL LOW (ref 6.3–8.0)

## 2018-06-12 LAB — CBC WITH AUTO DIFFERENTIAL
Basophils %: 1 %
Basophils Absolute: 0 10*3/uL (ref 0.0–0.2)
Eosinophils %: 2.6 %
Eosinophils Absolute: 0.1 10*3/uL (ref 0.0–0.7)
Hematocrit: 22 % — ABNORMAL LOW (ref 42.0–52.0)
Hemoglobin: 6.9 g/dL — CL (ref 14.0–18.0)
Lymphocytes %: 21.5 %
Lymphocytes Absolute: 0.6 10*3/uL — ABNORMAL LOW (ref 1.0–4.8)
MCH: 21.7 pg — ABNORMAL LOW (ref 27.0–31.3)
MCHC: 31.4 % — ABNORMAL LOW (ref 33.0–37.0)
MCV: 69.1 fL — ABNORMAL LOW (ref 80.0–100.0)
Monocytes %: 16.2 %
Monocytes Absolute: 0.4 10*3/uL (ref 0.2–0.8)
Neutrophils %: 58.7 %
Neutrophils Absolute: 1.5 10*3/uL (ref 1.4–6.5)
PLATELET SLIDE REVIEW: DECREASED
Platelets: 120 10*3/uL — ABNORMAL LOW (ref 130–400)
RBC: 3.19 M/uL — ABNORMAL LOW (ref 4.70–6.10)
RDW: 22.9 % — ABNORMAL HIGH (ref 11.5–14.5)
WBC: 2.6 10*3/uL — ABNORMAL LOW (ref 4.8–10.8)

## 2018-06-12 LAB — TYPE AND SCREEN
ABO/Rh: AB NEG
Antibody Screen: NEGATIVE

## 2018-06-12 LAB — VANCOMYCIN LEVEL, TROUGH: Vancomycin Tr: 23.6 ug/mL (ref 10.0–20.0)

## 2018-06-12 LAB — PREPARE RBC (CROSSMATCH): Dispense Status Blood Bank: TRANSFUSED

## 2018-06-12 LAB — CULTURE, BODY FLUID: Body Fluid Culture, Sterile: NO GROWTH

## 2018-06-12 MED ORDER — GADOTERIDOL 279.3 MG/ML IV SOLN
279.3 MG/ML | Freq: Once | INTRAVENOUS | Status: AC | PRN
Start: 2018-06-12 — End: 2018-06-12
  Administered 2018-06-12: 20:00:00 15 mL via INTRAVENOUS

## 2018-06-12 MED ORDER — VANCOMYCIN (VANCOCIN) INFUSION (OUTPATIENT ONLY)
Freq: Two times a day (BID) | 0 refills | Status: AC
Start: 2018-06-12 — End: 2018-07-24

## 2018-06-12 MED ORDER — VANCOMYCIN (VANCOCIN) INFUSION (OUTPATIENT ONLY)
Freq: Two times a day (BID) | 0 refills | Status: DC
Start: 2018-06-12 — End: 2018-06-12

## 2018-06-12 MED ORDER — LIDOCAINE HCL 2 % IJ SOLN
2 % | Freq: Once | INTRAMUSCULAR | Status: AC | PRN
Start: 2018-06-12 — End: 2018-06-12
  Administered 2018-06-12: 18:00:00 7 via INTRADERMAL

## 2018-06-12 MED ORDER — SODIUM CHLORIDE FLUSH 0.9 % IV SOLN
0.9 % | Freq: Once | INTRAVENOUS | Status: AC
Start: 2018-06-12 — End: 2018-06-12
  Administered 2018-06-12: 21:00:00 10 mL via INTRAVENOUS

## 2018-06-12 MED ORDER — SODIUM CHLORIDE 0.9 % IV BOLUS
0.9 % | Freq: Once | INTRAVENOUS | Status: AC
Start: 2018-06-12 — End: 2018-06-12
  Administered 2018-06-12: 21:00:00 20 mL via INTRAVENOUS

## 2018-06-12 MED ORDER — ALBUMIN HUMAN 25 % IV SOLN
25 % | Freq: Once | INTRAVENOUS | Status: AC
Start: 2018-06-12 — End: 2018-06-12
  Administered 2018-06-12: 23:00:00 50 g via INTRAVENOUS

## 2018-06-12 MED FILL — HYDROMORPHONE HCL 1 MG/ML IJ SOLN: 1 mg/mL | INTRAMUSCULAR | Qty: 1

## 2018-06-12 MED FILL — OXYCODONE HCL 5 MG PO TABS: 5 mg | ORAL | Qty: 1

## 2018-06-12 MED FILL — METHOCARBAMOL 500 MG PO TABS: 500 mg | ORAL | Qty: 1

## 2018-06-12 MED FILL — ALBUTEIN 25 % IV SOLN: 25 % | INTRAVENOUS | Qty: 200

## 2018-06-12 MED FILL — SODIUM CHLORIDE 0.9 % IV SOLN: 0.9 % | INTRAVENOUS | Qty: 250

## 2018-06-12 MED FILL — LACTULOSE 10 GM/15ML PO SOLN: 10 GM/15ML | ORAL | Qty: 30

## 2018-06-12 MED FILL — NICOTINE 21 MG/24HR TD PT24: 21 mg/(24.h) | TRANSDERMAL | Qty: 1

## 2018-06-12 MED FILL — VANCOMYCIN HCL 1 G IV SOLR: 1 g | INTRAVENOUS | Qty: 1500

## 2018-06-12 MED FILL — MONOJECT FLUSH SYRINGE 0.9 % IV SOLN: 0.9 % | INTRAVENOUS | Qty: 10

## 2018-06-12 MED FILL — FERROUS SULFATE 325 (65 FE) MG PO TABS: 325 (65 Fe) MG | ORAL | Qty: 1

## 2018-06-12 MED FILL — ONDANSETRON HCL 4 MG/2ML IJ SOLN: 4 MG/2ML | INTRAMUSCULAR | Qty: 2

## 2018-06-12 MED FILL — SPIRONOLACTONE 50 MG PO TABS: 50 mg | ORAL | Qty: 2

## 2018-06-12 MED FILL — PANTOPRAZOLE SODIUM 40 MG PO TBEC: 40 mg | ORAL | Qty: 1

## 2018-06-12 MED FILL — GABAPENTIN 100 MG PO CAPS: 100 mg | ORAL | Qty: 1

## 2018-06-12 MED FILL — LIDOCAINE PAIN RELIEF 4 % EX PTCH: 4 % | CUTANEOUS | Qty: 3

## 2018-06-12 MED FILL — FUROSEMIDE 40 MG PO TABS: 40 mg | ORAL | Qty: 1

## 2018-06-12 MED FILL — MELATONIN 1 MG PO TABS: 1 mg | ORAL | Qty: 1

## 2018-06-12 NOTE — Progress Notes (Signed)
Physical Therapy Missed Treatment   Facility/Department: [facility] M211/D552-08    NAME: Travis Ortega    DOB: 03-Aug-1970 (48 y.o.)  MRN: [id number]    Account: [id number]  Gender: male    [x]  Patient Unavailable:  Off unit for MRI     Will attempt PT treatment again at earliest convenience.        Electronically signed by Minda Ditto, PTA on 06/12/18 at 2:22 PM

## 2018-06-12 NOTE — Progress Notes (Signed)
Vascular and Interventional Radiology   Carin Hock. Floyde Parkins M.D. Joycelyn Das      Subjective: Travis Ortega is a 48 y.o. male with alcoholic cirrhosis and ascites. He is more distended today and will need a paracentesis. Being discharged home today.     Objective:   BP (!) 96/55    Pulse 69    Temp 98.2 ??F (36.8 ??C) (Oral)    Resp 14    Ht 6\' 1"  (1.854 m)    Wt 216 lb (98 kg)    SpO2 99%    BMI 28.50 kg/m??     Physical:   alert and  not in acute distress    Normocephalic, without obvious abnormality, atraumatic    Neck supple. No adenopathy. Thyroid symmetric, normal size, and without nodularity    normal rate and regular rhythm    chest clear, no wheezing, rales, normal symmetric air entry    Abdomen: Distended, slightly tender    Lab:  Recent Labs     06/12/18  0600   WBC 2.6*   RBC 3.19*   HGB 6.9*   HCT 22.0*   MCV 69.1*   MCH 21.7*   MCHC 31.4*   RDW 22.9*   PLT 120*       No results for input(s): INR, PROTIME in the last 72 hours.    Recent Labs     06/10/18  0657 06/11/18  1412 06/12/18  0600   CREATININE 0.94 1.07 1.32*       Assessment: Travis Ortega is a 48 y.o. male with liver cirrhosis and ascites    Plan: US guided paracentesis     Carin Hock. Floyde Parkins M.D. Joycelyn Das  06/12/2018,  1:27 PM

## 2018-06-12 NOTE — Other (Addendum)
NO SEDATION    U/S tech obtained images prior to start of procedure using U/S. P  VSS.      1232 Procedure explained, consent signed.  Timeout completed. Using U/S, Dr. Floyde Parkins marked, prepped LLQ with Chloraprep and draped with sterile drape and towels.     Site numbed with lidocaine.  One1step Centesis 80F catheter placed using Ultrasound guidance.  Fluid appears slightly cloudy. Catheter tubing connected to Neptune machine to remove fluid.    1305 Pt tolerated well. Total 5050 ml removed.     1310 Catheter removed, pressure held until hemostasis noted and bandaid applied. Verbal and tactile reassurance given throughout.     1312 Report called to [facility] RN on 4 WT, encouraged her to contact there primary physician about possible orders for albumin infusion.  Patient awaiting transport back to room # 478 via cart.

## 2018-06-12 NOTE — Progress Notes (Signed)
Patient return from special procedures. Pt had 5050 ml off. IV pain medication adminstered. MRI of brain ordered by neurology to r/o abscess. Pt taken to MRI at 1415. Electronically signed by Meryl Crutch, RN on 06/12/2018 at 2:26 PM

## 2018-06-12 NOTE — Progress Notes (Signed)
Assessment complete. Pt complaining of chronic back, leg, and neck pain. Pain meds administered per orders. Pt receiving 1 unit PRBC. Tolerating well. Pt had MRI of brain today and paracentesis. Tolerated well. Denies SOB. VSS. Safety maintained. Call light in reach. Electronically signed by Meryl Crutch, RN on 06/12/2018 at 6:10 PM

## 2018-06-12 NOTE — Consults (Signed)
[facility] Consult Note  Name: Travis Ortega  Age: 48 y.o.  Gender: male  CodeStatus: Full Code  Allergies: Nubain [Nalbuphine]  Morphine  Penicillin G  Tylenol [Acetaminophen]    Chief Complaint:Groin Pain (neck and back pain, emisis )    Primary Care Provider: Dillard Essex, APRN - CNP  InpatientTreatment Team: Treatment Team: Attending Provider: Tillman Sers, DO; Consulting Physician: Dalbert Garnet, MD; Consulting Physician: Edmonia Caprio, MD; Consulting Physician: Hilda Lias, MD; Utilization Reviewer: Racheal Patches, RN; Consulting Physician: Nani Ravens, MD; Consulting Physician: Verlin Fester, MD; Consulting Physician: Carolynn Comment, MD; Utilization Reviewer: Ileana Ladd, RN; Registered Nurse: Abel Presto, RN; Nursing Student: Lezlie Lye; Case Manager: Bernadene Bell, RN; Patient Care Tech: Riki Altes; Patient Care Tech: Orlene Plum; Social Worker: Everardo All, [place]  Admission Date: 06/07/2018      HPI   Pt seen and examined for neurology consult for right sided weakness. 48 yr old cauc male with PMH of Hep C, Hep B, Cirrhosis, ETOH abuse, esophageal varices, ascites requiring periodic paracentesis who presented to the ED on 2/22 with c/o scrotal swelling and abd pain with low grade temp. He was hyponatremic at 120 on presentation, K 5.2, BUN 21, cr 1.00, WBC 5.5, hgb 7.4, hct 23.7, platelets 123, BC +1/2 MSSA, CT a/p showed decompensated liver cirrhosis with large volume ascites, shrunken liver, splenomegaly, variceal formation.   Pt was noted to have right sided weakness 2 days ago. He reports that he developed sudden onset right sided weakness approx 1 month ago with associated dizziness and visual changes. He reports he has fallen several times since then due to left leg dragging. Dizziness is now occasional. Vision back to normal. No hx CVA. No recent or remote head trauma  Currently A&O x3, NAD, cooperative. Ill appearing. Noted right sided weakness 3-4/5  upper >lower with noted hyperreflexic throughout. He has had MRI of cervical, thoracic and lumbar spine, which revealed C5-6 suspicion of osteomyelitis, mod canal stenosis c6-7, small T5-6 disc protrusion, central T6-7 disc protrusion with mild mass effect, mod circumferential disc bulge T11-12, multilevel DDD of lumbar spine.   NO Headache, double vision, blurry vision, difficulty with speech, difficulty with swallowing, numbness, pain, nausea, vomiting, choking, neck pain, dizziness  Breast Cancer-related family history is not on file.  Social History     Socioeconomic History   ??? Marital status: Single     Spouse name: Not on file   ??? Number of children: Not on file   ??? Years of education: Not on file   ??? Highest education level: Not on file   Occupational History   ??? Not on file   Social Needs   ??? Financial resource strain: Not on file   ??? Food insecurity:     Worry: Not on file     Inability: Not on file   ??? Transportation needs:     Medical: Not on file     Non-medical: Not on file   Tobacco Use   ??? Smoking status: Current Every Day Smoker     Packs/day: 0.50     Types: Cigarettes   ??? Smokeless tobacco: Never Used   Substance and Sexual Activity   ??? Alcohol use: No   ??? Drug use: No   ??? Sexual activity: Not on file   Lifestyle   ??? Physical activity:     Days per week: Not on file     Minutes per session:  Not on file   ??? Stress: Not on file   Relationships   ??? Social connections:     Talks on phone: Not on file     Gets together: Not on file     Attends religious service: Not on file     Active member of club or organization: Not on file     Attends meetings of clubs or organizations: Not on file     Relationship status: Not on file   ??? Intimate partner violence:     Fear of current or ex partner: Not on file     Emotionally abused: Not on file     Physically abused: Not on file     Forced sexual activity: Not on file   Other Topics Concern   ??? Not on file   Social History Narrative   ??? Not on file       Vitals:     06/12/18 1305   BP: (!) 96/55   Pulse: 69   Resp: 14   Temp:    SpO2: 99%      Review of Systems   Constitutional: Positive for fatigue. Negative for appetite change, chills and fever.   HENT: Negative for hearing loss and trouble swallowing.    Eyes: Negative for visual disturbance.   Respiratory: Negative for cough, chest tightness, shortness of breath and wheezing.    Cardiovascular: Positive for leg swelling. Negative for chest pain and palpitations.   Gastrointestinal: Positive for abdominal distention. Negative for abdominal pain, nausea and vomiting.   Musculoskeletal: Positive for gait problem.   Skin: Negative for color change and rash.   Neurological: Positive for dizziness and weakness. Negative for tremors, seizures, syncope, facial asymmetry, speech difficulty, light-headedness, numbness and headaches.   Psychiatric/Behavioral: Negative for agitation, confusion and hallucinations. The patient is not nervous/anxious.      Physical Exam  Vitals signs and nursing note reviewed.   Constitutional:       General: He is not in acute distress.     Appearance: He is ill-appearing. He is not diaphoretic.   HENT:      Head: Normocephalic and atraumatic.   Eyes:      General: No visual field deficit.     Extraocular Movements: Extraocular movements intact.      Pupils: Pupils are equal, round, and reactive to light.   Cardiovascular:      Rate and Rhythm: Normal rate and regular rhythm.   Pulmonary:      Effort: Pulmonary effort is normal. No respiratory distress.      Breath sounds: Normal breath sounds.   Abdominal:      General: Bowel sounds are normal. There is distension.      Palpations: Abdomen is soft.      Tenderness: There is no abdominal tenderness.   Skin:     General: Skin is warm and dry.   Neurological:      Mental Status: He is alert and oriented to person, place, and time.      Cranial Nerves: No cranial nerve deficit, dysarthria or facial asymmetry.      Motor: Weakness present. No tremor or  seizure activity.      Coordination: Coordination abnormal (RUERUE). Finger-Nose-Finger Test abnormal (right).      Gait: Gait abnormal.      Deep Tendon Reflexes: Reflexes abnormal.      Reflex Scores:       Bicep reflexes are 3+ on the right side and 3+  on the left side.       Brachioradialis reflexes are 3+ on the right side and 3+ on the left side.       Patellar reflexes are 3+ on the right side and 3+ on the left side.        Medications:  Reviewed    Infusion Medications:   Scheduled Medications:   ??? sodium chloride  20 mL Intravenous Once   ??? albumin human  50 g Intravenous Once   ??? sodium chloride flush  10 mL Intravenous Once   ??? sodium chloride flush  10 mL Intravenous 2 times per day   ??? gabapentin  100 mg Oral TID   ??? [Held by provider] furosemide  40 mg Oral Daily   ??? ferrous sulfate  325 mg Oral Daily with breakfast   ??? melatonin  10 mg Oral Daily   ??? [Held by provider] spironolactone  100 mg Oral Daily   ??? pantoprazole  40 mg Oral QAM AC   ??? lidocaine  3 patch Transdermal Daily   ??? vancomycin (VANCOCIN) intermittent dosing Best boy)   Other RX Placeholder   ??? sodium chloride flush  10 mL Intravenous 2 times per day   ??? enoxaparin  40 mg Subcutaneous Daily   ??? nicotine  1 patch Transdermal Daily   ??? lactulose  20 g Oral TID   ??? tenofovir  300 mg Oral Daily     PRN Meds: sodium chloride flush, oxyCODONE, HYDROmorphone **OR** [DISCONTINUED] HYDROmorphone, methocarbamol, hydrOXYzine, sodium chloride flush, polyethylene glycol, promethazine, ondansetron    Labs:   Recent Labs     06/10/18  0657 06/11/18  1412 06/12/18  0600   WBC 2.1* 2.8* 2.6*   HGB 7.2* 7.7* 6.9*   HCT 23.0* 24.3* 22.0*   PLT 129* 141 120*     Recent Labs     06/10/18  0657 06/11/18  1412 06/12/18  0600   NA 129* 132* 130*   K 3.9   3.9 4.8 4.4   CL 97 99 97   CO2 19* 22 21   BUN 15 19 22*   CREATININE 0.94 1.07 1.32*   CALCIUM 8.3* 8.2* 8.0*     Recent Labs     06/10/18  0657 06/11/18  1412 06/12/18  0600   AST 51* 48* 49*   ALT '24  27 26   ' BILITOT 0.6 0.6 0.6   ALKPHOS 125* 147* 130*     No results for input(s): INR in the last 72 hours.  No results for input(s): CKTOTAL, TROPONINI in the last 72 hours.    Urinalysis:   Lab Results   Component Value Date    NITRU Negative 06/10/2018    WBCUA 0-2 07/10/2015    BACTERIA None 07/10/2015    RBCUA 3-5 07/10/2015    BLOODU Negative 06/10/2018    SPECGRAV 1.016 06/10/2018    GLUCOSEU Negative 06/10/2018       Radiology:   Most recent    EEG No procedure found.    MRI of Brain No results found for this or any previous visit.No results found for this or any previous visit.                          MRA of the Head and Neck: No results found for this or any previous visit.No results found for this or any previous visit.No results found for this or any previous visit.  CT of the Head: No results found for this or any previous visit.No results found for this or any previous visit.No results found for this or any previous visit.    Carotid duplex: No results found for this or any previous visit.No results found for this or any previous visit.No results found for this or any previous visit.    Echo No results found for this or any previous visit.          Assessment/Plan:    Right sided weakness  Hyperreflexia  Mild cervical myelopathy  Sepsis due to MSSA bactremia complicated by osteomyelitis/discits C5-6  Decompensated liver cirrhosis  Pancytopenia  MRI cervical, thoracic , lumbar spine reviewed by Dr Posey Pronto. No concerning spinal cord compression noted.   MRI brain no acute findings  Check sed rate, CRP, procalcitonin to conclude a diagnosis of osteomyelitis  Will follow  Would be difficult to believe its osteomyelitis without ESR and CRP as MRI findings are very subtle  And no other explanation for right side  Findings    Chrystal Zeimet R. Posey Pronto, MD, [place], American Board of Psychiatry & Neurology  Board Certified in Vascular Neurology  Board Certified in Neuromuscular  Medicine  Certified in Neurorehabilitation       Collaborating physicians: Dr Posey Pronto    Electronically signed by Maisie Fus, APRN - CNP on 06/12/2018 at 3:37 PM

## 2018-06-12 NOTE — Progress Notes (Signed)
Assessment documented. Tolerated infusions well. Medicated per MAR. Medicated for 10/10 pain per MAR this shift. VSS. No other needs at this time. Will continue to monitor. Electronically signed by Merri Brunette, RN on 06/12/2018 at 11:33 PM

## 2018-06-12 NOTE — Progress Notes (Addendum)
Infectious Diseases Inpatient Progress Note          HISTORY OF PRESENT ILLNESS:  Follow up MSSA bacteremia with Flu like sxs and decompensated liver cirrhosis. Had 9 lit paracentesis, has abdominal soreness, cough.  on IV Vanco, well tolerated. Patient had remarkable clinical improvement with decreased fevers. Blood Cx + MSSA.  MRI C spine with possible C5 discitis. Has decreased neck pain.     Current Medications:    ??? sodium chloride  20 mL Intravenous Once   ??? sodium chloride flush  10 mL Intravenous 2 times per day   ??? gabapentin  100 mg Oral TID   ??? [Held by provider] furosemide  40 mg Oral Daily   ??? ferrous sulfate  325 mg Oral Daily with breakfast   ??? melatonin  10 mg Oral Daily   ??? [Held by provider] spironolactone  100 mg Oral Daily   ??? pantoprazole  40 mg Oral QAM AC   ??? lidocaine  3 patch Transdermal Daily   ??? vancomycin (VANCOCIN) intermittent dosing [employer] manager)   Other RX Placeholder   ??? vancomycin  1,500 mg Intravenous Q12H   ??? sodium chloride flush  10 mL Intravenous 2 times per day   ??? enoxaparin  40 mg Subcutaneous Daily   ??? nicotine  1 patch Transdermal Daily   ??? lactulose  20 g Oral TID   ??? tenofovir  300 mg Oral Daily       Allergies:  Nubain [nalbuphine]; Morphine; Penicillin g; and Tylenol [acetaminophen]      Review of Systems  14 system review is negative other than HPI    Physical Exam   Vitals:    06/10/18 2005 06/11/18 0047 06/11/18 2011 06/12/18 0745   BP: (!) 96/53 113/75 (!) 99/52 116/68   Pulse: 68 66 67 73   Resp: 18  18 16    Temp: 97.9 ??F (36.6 ??C) 97.3 ??F (36.3 ??C) 98.6 ??F (37 ??C) 98.2 ??F (36.8 ??C)   TempSrc: Oral  Oral Oral   SpO2: 100% 100% 100% 99%   Weight:       Height:           General Appearance: alert and oriented to person, place and time, well-developed and well-nourished, in no acute distress  Skin: warm and dry, no rash.   Head: normocephalic and atraumatic  Eyes: extraocular eye movements intact, conjunctivae normal, icteric sclerae  ENT: oropharynx clear and moist  with normal mucous membranes. No thrush  Lungs: normal respiratory effort, Clear Lungs, no rhonchi, no crackles, no wheezes  Heart:RRR, nl S1/S2, no murmur  Abdomen: soft, + diffuse tenderness, distended with ascites, + BS  NEUROLOGICAL: alert and oriented x 3, no focal deficits  +2 B leg edema  No erythema, no warmth, no tenderness    DATA:    Lab Results   Component Value Date    WBC 2.6 (L) 06/12/2018    HGB 6.9 (LL) 06/12/2018    HCT 22.0 (L) 06/12/2018    MCV 69.1 (L) 06/12/2018    PLT 120 (L) 06/12/2018     Lab Results   Component Value Date    CREATININE 1.32 (H) 06/12/2018    BUN 22 (H) 06/12/2018    NA 130 (L) 06/12/2018    K 4.4 06/12/2018    CL 97 06/12/2018    CO2 21 06/12/2018       Hepatic Function Panel:  Lab Results   Component Value Date    ALKPHOS 130 06/12/2018  ALT 26 06/12/2018    AST 49 06/12/2018    PROT 6.2 06/12/2018    BILITOT 0.6 06/12/2018    LABALBU 2.7 06/12/2018    LABALBU 3.1 05/31/2016       Microbiology:   No results for input(s): BC in the last 72 hours.  Recent Labs     06/09/18  1423   BLOODCULT2 No Growth to date.  Any change in status will be called.   Summary   Limited echo for evaluation for endocarditis.   No obvious valve vegetations or regurgitation.   Only fair quality study, could miss small vegetation.   Consider alternate modality if clinically indicated like TEE.   Signature   MRI of the cervical spine: 1. Limited examination secondary to motion artifact and lack of IV contrast administration. 2. There is STIR hyperintensity noted within the vertebral bodies of C5 and C6 with asymmetrically increased hyperintensity within the C5-C6 intervertebral disc space, findings are suspicious for osteomyelitis/discitis the C5-C6 intervertebral disc and the C5 and C6 vertebral bodies. 3. Degenerative changes noted C3-C7, with neural foraminal narrowing ranging from moderate to moderately severe. Moderate spinal canal stenosis is noted at C6-C7. Please see above for level by  level details.       IMPRESSION:    ?? 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 AKI  ?? Decompensated liver cirrhosis with reaccumulating fluid  ?? Acute bronchitis, Flu like illness, no pneumonia    Patient Active Problem List   Diagnosis   ??? Recurrent incisional hernia   ??? Cirrhosis (HCC)   ??? Generalized abdominal pain   ??? Ascites of liver   ??? Visceral hyperalgesia   ??? Generalized muscle ache   ??? MSSA bacteremia   ??? Osteomyelitis of cervical vertebra (HCC)   ??? Cervicalgia   ??? 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 Displacement of thoracic intervertebral disc without myelopathy   ??? Degeneration of thoracic intervertebral disc   ??? Degeneration of lumbar intervertebral disc   ??? Muscle weakness of upper extremity       PLAN:  ?? PICC  ?? Change to 6 weeks IV Vanco and PO antibiotics for 6 weeks afterward for discitis  ?? Spoke to pharmacy to decrease Vanco dose  ?? F/U Vanco trough CBC BMP CRP   ?? F/U with Hepatology    Discussed with patient    Eudelia Bunch, MD

## 2018-06-12 NOTE — Progress Notes (Signed)
Pharmacy Vancomycin Consult     Vancomycin Day: 6  Current Dosing: 1500 mg IV q12h    Temp max:  98.68F    Recent Labs     06/11/18  1412 06/12/18  0600   BUN 19 22*       Recent Labs     06/11/18  1412 06/12/18  0600   CREATININE 1.07 1.32*       Recent Labs     06/11/18  1412 06/12/18  0600   WBC 2.8* 2.6*         Intake/Output Summary (Last 24 hours) at 06/12/2018 1736  Last data filed at 06/11/2018 2240  Gross per 24 hour   Intake 10 ml   Output --   Net 10 ml       Culture Date      Source                       Results    No results for input(s): BC in the last 72 hours.  No results for input(s): BLOODCULT2 in the last 72 hours.  No results for input(s): WNDABS in the last 72 hours.      Ht Readings from Last 1 Encounters:   06/07/18 6\' 1"  (1.854 m)        Wt Readings from Last 1 Encounters:   06/10/18 216 lb (98 kg)         Body mass index is 28.5 kg/m??.    Estimated Creatinine Clearance: 87 mL/min (A) (based on SCr of 1.32 mg/dL (H)).    Trough:   Recent Labs     06/12/18  1627   VANCOTROUGH 23.6*       Assessment/Plan:  Vanco level is 23.6 which is above target range. Anticipated higher level post slight renal function decrease. Dr Ruben Reason paged, recommend decreasing dose to vancomycin 1000 mg IV q12h, she agreed, holding dose this evening, beginning decreased dose 2/28 in am

## 2018-06-12 NOTE — Progress Notes (Addendum)
Physician Progress Note    06/12/2018   4:04 PM    Name:  Travis Ortega  MRN:    [id number]     IP Day: 5     Admit Date: 06/07/2018  4:23 PM  PCP: Yaakov Guthrie, APRN - CNP    Code Status:  Full Code    Subjective:     Seen after paracentesis. Still with same abdominal pain, neck pain, RUE weakness, RLE weakness and tingling. Nausea relieved with zofran. No cp, dyspnea.     Current Facility-Administered Medications   Medication Dose Route Frequency Provider Last Rate Last Dose   ??? 0.9 % sodium chloride bolus  20 mL Intravenous Once Colon Flattery, MD       ??? albumin human 25 % IV solution 50 g  50 g Intravenous Once Lowry Bowl, DO       ??? sodium chloride flush 0.9 % injection 10 mL  10 mL Intravenous Once Emmaline Life, APRN - CNP       ??? sodium chloride flush 0.9 % injection 10 mL  10 mL Intravenous 2 times per day Eudelia Bunch, MD   10 mL at 06/12/18 0813   ??? sodium chloride flush 0.9 % injection 10 mL  10 mL Intravenous PRN Glenis Smoker Abbud, MD       ??? oxyCODONE (ROXICODONE) immediate release tablet 5 mg  5 mg Oral Q4H PRN Earna Coder, MD   5 mg at 06/12/18 1059   ??? HYDROmorphone (DILAUDID) injection 1 mg  1 mg Intravenous Q4H PRN Earna Coder, MD   1 mg at 06/12/18 1329   ??? methocarbamol (ROBAXIN) tablet 500 mg  500 mg Oral Q6H PRN Earna Coder, MD   500 mg at 06/12/18 9509   ??? gabapentin (NEURONTIN) capsule 100 mg  100 mg Oral TID Earna Coder, MD   100 mg at 06/12/18 1328   ??? [Held by provider] furosemide (LASIX) tablet 40 mg  40 mg Oral Daily Lowry Bowl, DO   40 mg at 06/12/18 3267   ??? ferrous sulfate tablet 325 mg  325 mg Oral Daily with breakfast Lowry Bowl, DO   325 mg at 06/12/18 1245   ??? melatonin tablet 10 mg  10 mg Oral Daily Lowry Bowl, DO   10 mg at 06/11/18 2240   ??? hydrOXYzine (ATARAX) tablet 25 mg  25 mg Oral Q6H PRN Lowry Bowl, DO   25 mg at 06/10/18 1325   ??? [Held by provider] spironolactone (ALDACTONE) tablet 100 mg  100 mg Oral Daily Mirian Capuchin, MD   100 mg  at 06/12/18 8099   ??? pantoprazole (PROTONIX) tablet 40 mg  40 mg Oral QAM AC Yazid R Hussein, DO   40 mg at 06/12/18 0533   ??? lidocaine 4 % external patch 3 patch  3 patch Transdermal Daily Earna Coder, MD   3 patch at 06/12/18 8338   ??? vancomycin (VANCOCIN) intermittent dosing [employer])   Other RX Placeholder Rita A Abbud, MD       ??? sodium chloride flush 0.9 % injection 10 mL  10 mL Intravenous 2 times per day Rose Fillers, APRN - CNP   10 mL at 06/12/18 0814   ??? sodium chloride flush 0.9 % injection 10 mL  10 mL Intravenous PRN Rose Fillers, APRN - CNP       ??? polyethylene glycol (GLYCOLAX) packet 17 g  17 g  Oral Daily PRN Rose Fillers, APRN - CNP       ??? promethazine (PHENERGAN) tablet 12.5 mg  12.5 mg Oral Q6H PRN Rose Fillers, APRN - CNP       ??? ondansetron (ZOFRAN) injection 4 mg  4 mg Intravenous Q6H PRN Rose Fillers, APRN - CNP   4 mg at 06/12/18 0533   ??? enoxaparin (LOVENOX) injection 40 mg  40 mg Subcutaneous Daily Rose Fillers, APRN - CNP   Stopped at 06/11/18 2100   ??? nicotine (NICODERM CQ) 21 MG/24HR 1 patch  1 patch Transdermal Daily Rose Fillers, APRN - CNP   1 patch at 06/12/18 0834   ??? lactulose (CHRONULAC) 10 GM/15ML solution 20 g  20 g Oral TID Rose Fillers, APRN - CNP   20 g at 06/11/18 0945   ??? tenofovir (VIREAD) tablet 300 mg  300 mg Oral Daily Rose Fillers, APRN - CNP           Physical Examination:      Vitals:  BP (!) 96/55    Pulse 69    Temp 98.2 ??F (36.8 ??C) (Oral)    Resp 14    Ht 6\' 1"  (1.854 m)    Wt 216 lb (98 kg)    SpO2 99%    BMI 28.50 kg/m??   Temp (24hrs), Avg:98.4 ??F (36.9 ??C), Min:98.2 ??F (36.8 ??C), Max:98.6 ??F (37 ??C)    General appearance: alert, cooperative and no distress  Mental Status: oriented to person, place and time and normal affect  Back: Point tenderness in C-spine and and T-spine.  Lungs: clear to auscultation bilaterally, normal effort  Heart: regular rate and rhythm, no murmur  Abdomen: Diffuse tenderness to palpation with guarding and mild rebound tenderness.   Soft.  Bowel sounds present.  Extremities: 1+ pitting edema bilaterally  Skin: no gross lesions, rashes  Neuro: Mild asterixis.  Weakness in right upper extremity and right lower extremity.    Data:     Labs:  Recent Labs     06/11/18  1412 06/12/18  0600   WBC 2.8* 2.6*   HGB 7.7* 6.9*   PLT 141 120*     Recent Labs     06/11/18  1412 06/12/18  0600   NA 132* 130*   K 4.8 4.4   CL 99 97   CO2 22 21   BUN 19 22*   CREATININE 1.07 1.32*   GLUCOSE 112* 187*     Recent Labs     06/11/18  1412 06/12/18  0600   AST 48* 49*   ALT 27 26   BILITOT 0.6 0.6   ALKPHOS 147* 130*     MRI of the cervical spine:   1. Limited examination secondary to motion artifact and lack of IV contrast administration.   2. There is STIR hyperintensity noted within the vertebral bodies of C5 and C6 with asymmetrically increased hyperintensity within the C5-C6 intervertebral disc space, findings are suspicious for osteomyelitis/discitis the C5-C6 intervertebral disc and    the C5 and C6 vertebral bodies.   3. Degenerative changes noted C3-C7, with neural foraminal narrowing ranging from moderate to moderately severe. Moderate spinal canal stenosis is noted at C6-C7. Please see above for level by level details.   ??   MRI of the thoracic spine:   1. Limited examination secondary to motion artifact and lack of IV contrast administration.   2. There is no evidence of osteomyelitis, discitis or abscess within the MRI thoracic  spine exam, given limitations imposed on interpretation and evaluation by lack of IV contrast administration.   3. Small T5-T6 right paracentral disc protrusion without frank spinal canal stenosis.   4. Central T6-T7 disc protrusion measuring approximately 0.6 cm anterior posterior by 0.56 cm transverse, with mild mass effect on the ventral thoracic spinal cord.   5. Moderate circumferential disc bulge at T11-T12. See above for other level by level evaluation.   ??   MRI of the lumbar spine:   1. Limited examination secondary to  motion artifact and lack of IV contrast administration.   2. No evidence of osteomyelitis, discitis or abscess within the MRI lumbar spine exam, given limitations imposed on interpretation and evaluation by lack of IV contrast administration.   3. Multilevel degenerative disc disease and facet osteoarthropathy L1-S1, with possible abutment of the exiting right L5 spinal nerve root at L5-S1 level, by the facet osteoarthropathy. No spinal canal stenosis identified. Multilevel neural foraminal    narrowing noted ranging from mild to moderate to moderately severe, see above for level by level details.   4. Joint effusion at the left L4-L5 facet articulation.         Assessment and Plan:        48 year old male with cirrhosis (Hep B/C, and h/o heavy etOH use) presented with worsening abdominal pain and scrotal swelling.    1.  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  Possible trigger- MSSA infection  -Ascitic studies negative for SBP  -lasix 40 / aldactone 100 - will need to hold with new AKI  -Continue lactulose  -50g albumin today  -TIPS evaluation at [facility] after treatment of infection complete  -Pain management following    2. 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 No obvious vegetation on TTE. Repeat BCx clear  -will discuss Abx with ID- likely will need 6 week course for vertebral OM/discitis    3.  Iron deficiency anemia / Pancytopenia: Continue PPI.  Supplement iron. 1u pRBC ordered.    4. RUE and RLE weakness and sensory loss in setting of C5-6 OM, T5-7 disc protrusion with mild mass effect, and L5-S1 narrowing. MRI head negative  - continue PT/OT, pain control  - will ask neuro opinion if there is any other intervention    5. AKI suspect hemodynamically mediated: hold diuretics for now    Diet: DIET GENERAL; Low Sodium (2 GM)  Ppx: lovenox  Full Code    Dispo: [facility] when medically  stable. Will need to keep in-patient for new AKI    Electronically signed by Lowry Bowl, DO on 06/12/2018 at 4:04 PM

## 2018-06-12 NOTE — Progress Notes (Signed)
Vancomycin Day: 5    Recent Labs     06/12/18  0600   CREATININE 1.32*    Estimated Creatinine Clearance: 87 mL/min (A) (based on SCr of 1.32 mg/dL (H)).    Renal function has decreased today.  Will order vancomycin trough prior to next scheduled dose (level at 1615 today) . Per Dr Ruben Reason : hold further vanco doses today until after results reviewed by pharmacy for dose adjustment.    Leilani Merl RPh  06/12/18  10:49 AM

## 2018-06-12 NOTE — Plan of Care (Signed)
Problem: Pain:  Goal: Pain level will decrease  Description  Pain level will decrease  Outcome: Ongoing  Goal: Control of acute pain  Description  Control of acute pain  Outcome: Ongoing  Goal: Control of chronic pain  Description  Control of chronic pain  Outcome: Ongoing     Problem: Falls - Risk of:  Goal: Will remain free from falls  Description  Will remain free from falls  Outcome: Ongoing  Goal: Absence of physical injury  Description  Absence of physical injury  Outcome: Ongoing     Problem: Activity:  Goal: Fatigue will decrease  Description  Fatigue will decrease  Outcome: Ongoing  Goal: Ability to tolerate increased activity will improve  Description  Ability to tolerate increased activity will improve  Outcome: Ongoing  Goal: Ability to maintain optimal joint mobility will improve  Description  Ability to maintain optimal joint mobility will improve  Outcome: Ongoing     Problem: Bowel/Gastric:  Goal: Ability to achieve a regular elimination pattern will improve  Description  Ability to achieve a regular elimination pattern will improve  Outcome: Ongoing     Problem: Cardiac:  Goal: Ability to maintain an adequate cardiac output will improve  Description  Ability to maintain an adequate cardiac output will improve  Outcome: Ongoing  Goal: Ability to maintain adequate ventilation will improve  Description  Ability to maintain adequate ventilation will improve  Outcome: Ongoing  Goal: Ability to achieve and maintain adequate cardiopulmonary perfusion will improve  Description  Ability to achieve and maintain adequate cardiopulmonary perfusion will improve  Outcome: Ongoing     Problem: Mobility - Impaired:  Goal: Mobility will improve  Description  Therapy evaluation completed.  Please see daily notes and/or progress notes for details related to planned treatment interventions, goals and functional abilities.     Outcome: Ongoing     Problem: IP BALANCE  Goal: LTG - patient will maintain standing balance  to allow for completion of daily activities  Outcome: Ongoing

## 2018-06-13 LAB — COMPREHENSIVE METABOLIC PANEL W/ REFLEX TO MG FOR LOW K
ALT: 26 U/L (ref 0–41)
AST: 51 U/L — ABNORMAL HIGH (ref 0–40)
Albumin: 2.8 g/dL — ABNORMAL LOW (ref 3.5–4.6)
Alkaline Phosphatase: 132 U/L — ABNORMAL HIGH (ref 35–104)
Anion Gap: 10 mEq/L (ref 9–15)
BUN: 22 mg/dL — ABNORMAL HIGH (ref 6–20)
CO2: 22 mEq/L (ref 20–31)
Calcium: 8.2 mg/dL — ABNORMAL LOW (ref 8.5–9.9)
Chloride: 100 mEq/L (ref 95–107)
Creatinine: 1.35 mg/dL — ABNORMAL HIGH (ref 0.70–1.20)
GFR African American: >60 (ref >60)
GFR Non-African American: 57 — ABNORMAL LOW (ref >60)
Globulin: 3.6 g/dL — ABNORMAL HIGH (ref 2.3–3.5)
Glucose: 98 mg/dL (ref 70–99)
Potassium reflex Magnesium: 5 mEq/L — ABNORMAL HIGH (ref 3.4–4.9)
Sodium: 132 mEq/L — ABNORMAL LOW (ref 135–144)
Total Bilirubin: 0.6 mg/dL (ref 0.2–0.7)
Total Protein: 6.4 g/dL (ref 6.3–8.0)

## 2018-06-13 LAB — CBC WITH AUTO DIFFERENTIAL
Atypical Lymphocytes Relative: 2 %
Bands Relative: 8 %
Basophils %: 1 %
Basophils Absolute: 0 10*3/uL (ref 0.0–0.2)
Eosinophils %: 2 %
Eosinophils Absolute: 0.1 10*3/uL (ref 0.0–0.7)
Hematocrit: 23 % — ABNORMAL LOW (ref 42.0–52.0)
Hemoglobin: 7.3 g/dL — ABNORMAL LOW (ref 14.0–18.0)
Lymphocytes %: 21 %
Lymphocytes Absolute: 0.6 10*3/uL — ABNORMAL LOW (ref 1.0–4.8)
MCH: 21.9 pg — ABNORMAL LOW (ref 27.0–31.3)
MCHC: 31.7 % — ABNORMAL LOW (ref 33.0–37.0)
MCV: 69 fL — ABNORMAL LOW (ref 80.0–100.0)
Metamyelocytes Relative: 2 %
Monocytes %: 9.5 %
Monocytes Absolute: 0.3 10*3/uL (ref 0.2–0.8)
Neutrophils %: 53 %
Neutrophils Absolute: 1.7 10*3/uL (ref 1.4–6.5)
PLATELET SLIDE REVIEW: DECREASED
Platelets: 93 10*3/uL — ABNORMAL LOW (ref 130–400)
Promonocytes: 2 % — AB
RBC: 3.33 M/uL — ABNORMAL LOW (ref 4.70–6.10)
RDW: 23.4 % — ABNORMAL HIGH (ref 11.5–14.5)
WBC: 2.7 10*3/uL — ABNORMAL LOW (ref 4.8–10.8)

## 2018-06-13 LAB — CULTURE, BLOOD 1: Blood Culture, Routine: NO GROWTH

## 2018-06-13 LAB — PROCALCITONIN: Procalcitonin: 0.38 ng/mL — ABNORMAL HIGH (ref 0.00–0.15)

## 2018-06-13 LAB — SEDIMENTATION RATE: Sed Rate: 31 mm — ABNORMAL HIGH (ref 0–10)

## 2018-06-13 LAB — HIGH SENSITIVITY CRP: CRP High Sensitivity: 17.6 mg/L — ABNORMAL HIGH (ref 0.0–5.0)

## 2018-06-13 MED ORDER — ALBUMIN HUMAN 25 % IV SOLN
25 % | Freq: Once | INTRAVENOUS | Status: AC
Start: 2018-06-13 — End: 2018-06-13
  Administered 2018-06-13: 23:00:00 25 g via INTRAVENOUS

## 2018-06-13 MED ORDER — DEXTROSE 5 % IV SOLN (MINI-BAG)
5 % | Freq: Two times a day (BID) | INTRAVENOUS | Status: DC
Start: 2018-06-13 — End: 2018-06-16
  Administered 2018-06-13 – 2018-06-16 (×8): 1000 mg via INTRAVENOUS

## 2018-06-13 MED ORDER — METHOCARBAMOL 750 MG PO TABS
750 MG | Freq: Three times a day (TID) | ORAL | Status: DC
Start: 2018-06-13 — End: 2018-06-16
  Administered 2018-06-13 – 2018-06-16 (×12): 750 mg via ORAL

## 2018-06-13 MED ORDER — DIAZEPAM 5 MG/ML IJ SOLN
5 MG/ML | Freq: Once | INTRAMUSCULAR | Status: AC
Start: 2018-06-13 — End: 2018-06-13
  Administered 2018-06-13: 08:00:00 5 mg via INTRAVENOUS

## 2018-06-13 MED FILL — HYDROXYZINE HCL 25 MG PO TABS: 25 mg | ORAL | Qty: 1

## 2018-06-13 MED FILL — OXYCODONE HCL 5 MG PO TABS: 5 mg | ORAL | Qty: 1

## 2018-06-13 MED FILL — HYDROMORPHONE HCL 1 MG/ML IJ SOLN: 1 mg/mL | INTRAMUSCULAR | Qty: 1

## 2018-06-13 MED FILL — ALBUTEIN 25 % IV SOLN: 25 % | INTRAVENOUS | Qty: 100

## 2018-06-13 MED FILL — PANTOPRAZOLE SODIUM 40 MG PO TBEC: 40 mg | ORAL | Qty: 1

## 2018-06-13 MED FILL — ONDANSETRON HCL 4 MG/2ML IJ SOLN: 4 MG/2ML | INTRAMUSCULAR | Qty: 2

## 2018-06-13 MED FILL — METHOCARBAMOL 750 MG PO TABS: 750 mg | ORAL | Qty: 1

## 2018-06-13 MED FILL — GABAPENTIN 100 MG PO CAPS: 100 mg | ORAL | Qty: 1

## 2018-06-13 MED FILL — VANCOMYCIN HCL 1 G IV SOLR: 1 g | INTRAVENOUS | Qty: 1000

## 2018-06-13 MED FILL — DIAZEPAM 5 MG/ML IJ SOLN: 5 mg/mL | INTRAMUSCULAR | Qty: 2

## 2018-06-13 MED FILL — LIDOCAINE PAIN RELIEF 4 % EX PTCH: 4 % | CUTANEOUS | Qty: 3

## 2018-06-13 MED FILL — MELATONIN 1 MG PO TABS: 1 mg | ORAL | Qty: 1

## 2018-06-13 MED FILL — LACTULOSE 10 GM/15ML PO SOLN: 10 GM/15ML | ORAL | Qty: 30

## 2018-06-13 MED FILL — LOVENOX 40 MG/0.4ML SC SOLN: 40 MG/0.4ML | SUBCUTANEOUS | Qty: 0.4

## 2018-06-13 MED FILL — NICOTINE 21 MG/24HR TD PT24: 21 mg/(24.h) | TRANSDERMAL | Qty: 1

## 2018-06-13 MED FILL — FERROUS SULFATE 325 (65 FE) MG PO TABS: 325 (65 Fe) MG | ORAL | Qty: 1

## 2018-06-13 NOTE — Progress Notes (Signed)
Physician Progress Note    06/13/2018   4:38 PM    Name:  Travis Ortega  MRN:    [id number]     IP Day: 6     Admit Date: 06/07/2018  4:23 PM  PCP: Yaakov Guthrie, APRN - CNP    Code Status:  Full Code    Subjective:     Last night he had significant neck pain and stiffness.  He still has right upper extremity weakness and significant abdominal pain.  Denies nausea, chest pain, dyspnea.  Moving bowels.    Current Facility-Administered Medications   Medication Dose Route Frequency Provider Last Rate Last Dose   ??? methocarbamol (ROBAXIN) tablet 750 mg  750 mg Oral TID Colon Flattery, MD   750 mg at 06/13/18 1307   ??? vancomycin 1000 mg IVPB in 250 mL D5W addavial  1,000 mg Intravenous Q12H Rita A Abbud, MD 250 mL/hr at 06/13/18 1623 1,000 mg at 06/13/18 1623   ??? sodium chloride flush 0.9 % injection 10 mL  10 mL Intravenous 2 times per day Eudelia Bunch, MD   10 mL at 06/13/18 0842   ??? sodium chloride flush 0.9 % injection 10 mL  10 mL Intravenous PRN Glenis Smoker Abbud, MD   10 mL at 06/13/18 0313   ??? oxyCODONE (ROXICODONE) immediate release tablet 5 mg  5 mg Oral Q4H PRN Earna Coder, MD   5 mg at 06/13/18 1553   ??? HYDROmorphone (DILAUDID) injection 1 mg  1 mg Intravenous Q4H PRN Earna Coder, MD   1 mg at 06/13/18 1245   ??? gabapentin (NEURONTIN) capsule 100 mg  100 mg Oral TID Earna Coder, MD   100 mg at 06/13/18 1307   ??? [Held by provider] furosemide (LASIX) tablet 40 mg  40 mg Oral Daily Lowry Bowl, DO   40 mg at 06/12/18 6948   ??? ferrous sulfate tablet 325 mg  325 mg Oral Daily with breakfast Lowry Bowl, DO   325 mg at 06/13/18 0736   ??? melatonin tablet 10 mg  10 mg Oral Daily Lowry Bowl, DO   10 mg at 06/12/18 2157   ??? hydrOXYzine (ATARAX) tablet 25 mg  25 mg Oral Q6H PRN Lowry Bowl, DO   25 mg at 06/10/18 1325   ??? [Held by provider] spironolactone (ALDACTONE) tablet 100 mg  100 mg Oral Daily Mirian Capuchin, MD   100 mg at 06/12/18 5462   ??? pantoprazole (PROTONIX) tablet 40 mg  40 mg Oral QAM  AC Yazid R Hussein, DO   40 mg at 06/12/18 0533   ??? lidocaine 4 % external patch 3 patch  3 patch Transdermal Daily Earna Coder, MD   3 patch at 06/13/18 [phone]   ??? vancomycin (VANCOCIN) intermittent dosing [employer] manager)   Other RX Placeholder Rita A Abbud, MD       ??? sodium chloride flush 0.9 % injection 10 mL  10 mL Intravenous 2 times per day Rose Fillers, APRN - CNP   10 mL at 06/13/18 0093   ??? sodium chloride flush 0.9 % injection 10 mL  10 mL Intravenous PRN Rose Fillers, APRN - CNP       ??? polyethylene glycol (GLYCOLAX) packet 17 g  17 g Oral Daily PRN Rose Fillers, APRN - CNP       ??? promethazine (PHENERGAN) tablet 12.5 mg  12.5 mg Oral Q6H PRN Rose Fillers, APRN - CNP       ???  ondansetron (ZOFRAN) injection 4 mg  4 mg Intravenous Q6H PRN Rose Fillers, APRN - CNP   4 mg at 06/12/18 2007   ??? [Held by provider] enoxaparin (LOVENOX) injection 40 mg  40 mg Subcutaneous Daily Rose Fillers, APRN - CNP   Stopped at 06/11/18 2100   ??? nicotine (NICODERM CQ) 21 MG/24HR 1 patch  1 patch Transdermal Daily Rose Fillers, APRN - CNP   1 patch at 06/13/18 [phone]   ??? lactulose (CHRONULAC) 10 GM/15ML solution 20 g  20 g Oral TID Rose Fillers, APRN - CNP   20 g at 06/11/18 0945   ??? tenofovir (VIREAD) tablet 300 mg  300 mg Oral Daily Rose Fillers, APRN - CNP           Physical Examination:      Vitals:  BP (!) 93/46 Comment: notified nurse Kasey RN   Pulse 72    Temp 98.2 ??F (36.8 ??C) (Oral)    Resp 16    Ht 6\' 1"  (1.854 m)    Wt 216 lb (98 kg)    SpO2 100%    BMI 28.50 kg/m??   Temp (24hrs), Avg:98 ??F (36.7 ??C), Min:97.3 ??F (36.3 ??C), Max:98.6 ??F (37 ??C)    General appearance: alert, cooperative and no distress  Mental Status: oriented to person, place and time and normal affect  Back: Point tenderness in C-spine and and T-spine.  Lungs: clear to auscultation bilaterally, normal effort  Heart: regular rate and rhythm, no murmur  Abdomen: Diffuse tenderness to palpation with guarding and mild rebound tenderness.  Soft.  Bowel sounds  present.  Extremities: 1+ pitting edema bilaterally  Skin: no gross lesions, rashes  Neuro: Mild asterixis.  Weakness in right upper extremity and right lower extremity.    Data:     Labs:  Recent Labs     06/12/18  0600 06/13/18  0542   WBC 2.6* 2.7*   HGB 6.9* 7.3*   PLT 120* 93*     Recent Labs     06/12/18  0600 06/13/18  0545   NA 130* 132*   K 4.4 5.0*   CL 97 100   CO2 21 22   BUN 22* 22*   CREATININE 1.32* 1.35*   GLUCOSE 187* 98     Recent Labs     06/12/18  0600 06/13/18  0545   AST 49* 51*   ALT 26 26   BILITOT 0.6 0.6   ALKPHOS 130* 132*     MRI of the cervical spine:   1. Limited examination secondary to motion artifact and lack of IV contrast administration.   2. There is STIR hyperintensity noted within the vertebral bodies of C5 and C6 with asymmetrically increased hyperintensity within the C5-C6 intervertebral disc space, findings are suspicious for osteomyelitis/discitis the C5-C6 intervertebral disc and    the C5 and C6 vertebral bodies.   3. Degenerative changes noted C3-C7, with neural foraminal narrowing ranging from moderate to moderately severe. Moderate spinal canal stenosis is noted at C6-C7. Please see above for level by level details.   ??   MRI of the thoracic spine:   1. Limited examination secondary to motion artifact and lack of IV contrast administration.   2. There is no evidence of osteomyelitis, discitis or abscess within the MRI thoracic spine exam, given limitations imposed on interpretation and evaluation by lack of IV contrast administration.   3. Small T5-T6 right paracentral disc protrusion without frank spinal canal stenosis.  4. Central T6-T7 disc protrusion measuring approximately 0.6 cm anterior posterior by 0.56 cm transverse, with mild mass effect on the ventral thoracic spinal cord.   5. Moderate circumferential disc bulge at T11-T12. See above for other level by level evaluation.   ??   MRI of the lumbar spine:   1. Limited examination secondary to motion artifact and  lack of IV contrast administration.   2. No evidence of osteomyelitis, discitis or abscess within the MRI lumbar spine exam, given limitations imposed on interpretation and evaluation by lack of IV contrast administration.   3. Multilevel degenerative disc disease and facet osteoarthropathy L1-S1, with possible abutment of the exiting right L5 spinal nerve root at L5-S1 level, by the facet osteoarthropathy. No spinal canal stenosis identified. Multilevel neural foraminal    narrowing noted ranging from mild to moderate to moderately severe, see above for level by level details.   4. Joint effusion at the left L4-L5 facet articulation.         Assessment and Plan:        48 year old male with cirrhosis (Hep B/C, and h/o heavy etOH use) presented with worsening abdominal pain and scrotal swelling.    1.  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 after treatment of infection complete  -Pain management following    2. 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 No obvious vegetation on TTE. Repeat BCx clear  -6 weeks IV vancomycin    3.  Iron deficiency anemia / Pancytopenia: Continue PPI.  Supplement iron. 1u pRBC given this admission    4. RUE and RLE weakness and sensory loss in setting of C5-6 OM, T5-7 disc protrusion with mild mass effect, and L5-S1 narrowing. MRI head negative  - continue PT/OT, pain control  -Appreciate neurology assistance    5. AKI suspect hemodynamically mediated from diuretics and paracenteses: hold diuretics for now.  Nephrology consulted    Diet: DIET GENERAL; Low Sodium (2 GM)  Ppx: lovenox  Full Code    Dispo: [facility] when medically stable. Will need to keep in-patient for new AKI    Electronically signed  by Lowry Bowl, DO on 06/13/2018 at 4:38 PM

## 2018-06-13 NOTE — Progress Notes (Signed)
[facility] Daily Progress Note  Name: Travis Ortega  Age: 48 y.o.  Gender: male  CodeStatus: Full Code  Allergies: Nubain [Nalbuphine]  Morphine  Penicillin G  Tylenol [Acetaminophen]    Chief Complaint:Groin Pain (neck and back pain, emisis )    Primary Care Provider: Yaakov Guthrie, APRN - CNP  InpatientTreatment Team: Treatment Team: Attending Provider: Lowry Bowl, DO; Consulting Physician: Thamas Jaegers, MD; Consulting Physician: Mirian Capuchin, MD; Consulting Physician: Eudelia Bunch, MD; Utilization Reviewer: Marsa Aris, RN; Consulting Physician: Earna Coder, MD; Consulting Physician: Windy Fast, MD; Consulting Physician: Wynelle Casselberry, MD; Utilization Reviewer: Tacey Heap, RN; Nursing Student: Tyna Jaksch; Registered Nurse: Meryl Crutch, RN; Case Manager: Doylene Canard, RN; Nursing Student: Bethann Punches; Social Worker: Nathaneil Canary, LSW; Patient Care Tech: Jonni Sanger; Consulting Physician: Synetta Shadow, MD  Admission Date: 06/07/2018      HPI   Pt seen and examined for neuro follow up for right sided weakness. Currently A&O x3, NAD, cooperative. Still with right sided weakness upper > lower. Pt with c/o pain down right arm that radiates from neck/shoulder and tingling to right hand. Also with limited ROM, up to about  90 degrees than becomes too painful.   NO Headache, double vision, blurry vision, difficulty with speech, difficulty with swallowing, weakness, numbness, pain, nausea, vomiting, choking, neck pain, dizziness  Vitals:    06/13/18 0752   BP: (!) 97/48   Pulse: 66   Resp: 18   Temp: 97.3 ??F (36.3 ??C)   SpO2:       Review of Systems   Constitutional: Positive for fatigue. Negative for appetite change, chills and fever.   HENT: Negative for hearing loss and trouble swallowing.    Eyes: Negative for visual disturbance.   Respiratory: Negative for cough, chest tightness, shortness of breath and wheezing.    Cardiovascular: Positive for leg  swelling. Negative for chest pain and palpitations.   Gastrointestinal: Positive for abdominal distention. Negative for abdominal pain, constipation, diarrhea, nausea and vomiting.   Musculoskeletal: Positive for gait problem.   Skin: Negative for color change and rash.   Neurological: Positive for weakness. Negative for dizziness, tremors, seizures, syncope, facial asymmetry, speech difficulty, light-headedness, numbness and headaches.   Psychiatric/Behavioral: Negative for agitation, confusion and hallucinations. The patient is not nervous/anxious.      Physical Exam  Vitals signs and nursing note reviewed.   Constitutional:       General: He is not in acute distress.     Appearance: He is not diaphoretic.   HENT:      Head: Normocephalic and atraumatic.   Eyes:      General: No visual field deficit.     Extraocular Movements: Extraocular movements intact.      Pupils: Pupils are equal, round, and reactive to light.   Cardiovascular:      Rate and Rhythm: Normal rate and regular rhythm.   Pulmonary:      Effort: Pulmonary effort is normal. No respiratory distress.      Breath sounds: Normal breath sounds.   Abdominal:      General: Bowel sounds are normal. There is distension.      Palpations: Abdomen is soft.   Skin:     General: Skin is warm and dry.   Neurological:      Mental Status: He is alert and oriented to person, place, and time.      Cranial Nerves: No cranial nerve deficit,  dysarthria or facial asymmetry.      Motor: Weakness present. No tremor or seizure activity.      Coordination: Coordination abnormal (RUE).      Gait: Gait abnormal.      Deep Tendon Reflexes: Reflexes abnormal.       Medications:  Reviewed    Infusion Medications:   Scheduled Medications:   ??? methocarbamol  750 mg Oral TID   ??? vancomycin  1,000 mg Intravenous Q12H   ??? sodium chloride flush  10 mL Intravenous 2 times per day   ??? gabapentin  100 mg Oral TID   ??? [Held by provider] furosemide  40 mg Oral Daily   ??? ferrous sulfate  325  mg Oral Daily with breakfast   ??? melatonin  10 mg Oral Daily   ??? [Held by provider] spironolactone  100 mg Oral Daily   ??? pantoprazole  40 mg Oral QAM AC   ??? lidocaine  3 patch Transdermal Daily   ??? vancomycin (VANCOCIN) intermittent dosing Leisure centre manager)   Other RX Placeholder   ??? sodium chloride flush  10 mL Intravenous 2 times per day   ??? [Held by provider] enoxaparin  40 mg Subcutaneous Daily   ??? nicotine  1 patch Transdermal Daily   ??? lactulose  20 g Oral TID   ??? tenofovir  300 mg Oral Daily     PRN Meds: sodium chloride flush, oxyCODONE, HYDROmorphone **OR** [DISCONTINUED] HYDROmorphone, hydrOXYzine, sodium chloride flush, polyethylene glycol, promethazine, ondansetron    Labs:   Recent Labs     06/11/18  1412 06/12/18  0600 06/13/18  0542   WBC 2.8* 2.6* 2.7*   HGB 7.7* 6.9* 7.3*   HCT 24.3* 22.0* 23.0*   PLT 141 120* 93*     Recent Labs     06/11/18  1412 06/12/18  0600 06/13/18  0545   NA 132* 130* 132*   K 4.8 4.4 5.0*   CL 99 97 100   CO2 22 21 22    BUN 19 22* 22*   CREATININE 1.07 1.32* 1.35*   CALCIUM 8.2* 8.0* 8.2*     Recent Labs     06/11/18  1412 06/12/18  0600 06/13/18  0545   AST 48* 49* 51*   ALT 27 26 26    BILITOT 0.6 0.6 0.6   ALKPHOS 147* 130* 132*     No results for input(s): INR in the last 72 hours.  No results for input(s): CKTOTAL, TROPONINI in the last 72 hours.    Urinalysis:   Lab Results   Component Value Date    NITRU Negative 06/10/2018    WBCUA 0-2 07/10/2015    BACTERIA None 07/10/2015    RBCUA 3-5 07/10/2015    BLOODU Negative 06/10/2018    SPECGRAV 1.016 06/10/2018    GLUCOSEU Negative 06/10/2018       Radiology:   Most recent    EEG No procedure found.    MRI of Brain   Results for orders placed during the hospital encounter of 06/07/18   MRI BRAIN W WO CONTRAST    Narrative MRI BRAIN W WO CONTRAST : 06/12/2018 1:15 PM    CLINICAL HISTORY:  right sided weakness in the setting of OM, r/o abscess .    COMPARISON: Cervical, thoracic and lumbar spine MRIs 06/10/2018.    TECHNIQUE:  Multiplanar MR imaging of the head was performed before and after intravenous administration of approximately 15 mL of ProHance gadolinium contrast.  FINDINGS:     Motion artifact limits some sequences.    There is no infarct, intracranial hemorrhage, mass effect, midline shift, extra-axial collection, hydrocephalus, or abnormal enhancement.      Minimal to mild nonspecific predominantly subcortical supratentorial white matter changes are most likely chronic small vessel ischemic disease.          Impression NO ACUTE INTRACRANIAL PROCESS IDENTIFIED.             No results found for this or any previous visit.                          MRA of the Head and Neck: No results found for this or any previous visit.No results found for this or any previous visit.No results found for this or any previous visit.                          CT of the Head: No results found for this or any previous visit.No results found for this or any previous visit.No results found for this or any previous visit.    Carotid duplex: No results found for this or any previous visit.No results found for this or any previous visit.No results found for this or any previous visit.    Echo No results found for this or any previous visit.          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 No concerning spinal cord compression noted.   MRI brain no acute findings  Check sed rate, CRP, procalcitonin to conclude a diagnosis of osteomyelitis  Will follow  MRI findings are very subtle for OM, sed rate, CRP, procalcitonin mildly elevated, ID following  May need EMG/further imaging as OP for right arm weakness    Adaya Garmany R. Allena Katz, MD, FAHA  Diplomate, American Board of Psychiatry & Neurology  Board Certified in Vascular Neurology  Board  Certified in Neuromuscular Medicine  Certified in Neurorehabilitation       Collaborating physicians: Dr Allena Katz    Electronically signed by Emmaline Life, APRN - CNP on 06/13/2018 at 11:21 AM

## 2018-06-13 NOTE — Progress Notes (Signed)
Head to toe assessment complted. Vitals Taken x2. B/p 97/48 in am, Pt stated sharp neck  pain was a 9/10 during assessment. BP 93/46 at 1627.  Social research officer, government.notified of low BP. Hourly Patient rounds completed. Pt stated no pain at end of shift. Pain status has improved. End of shift given to [place], Charity fundraiser

## 2018-06-13 NOTE — Consults (Signed)
[facility] Nephrology  Consult Note           Reason for Consult:  AKI  Requesting Physician:  Dr. Pricilla Riffle    Chief Complaint:  Abdominal pain/scrotal pain  History Obtained From:  patient, electronic medical record    History of Present Ilness:    48 y.o. year old male with history s/f HBV/HCV cirrhosis c/b portal HTN, ascites who was admitted on 06/07/18 for abdominal pain and scrotal swelling in setting of decompensated cirrhosis. Course c/b recurrent ascites requiring paracenteses (02/24 and 02/27, 9 and 5.5 L respectively, no SBP), MSSA bacteremia c/b C5-C6 OM/discites on abx, RUE/RLE weakness and sensory loss in setting of disc protrusions w/ mass effect and now AKI for which nephrology has been consulted. Scr nml prior to yesterday at which point started trending up. Pt has been hypotensive w/ SBP prominently in the 90s. Was on lasix and aldactone and last paracentesis was yesterday. Got 50 g of albumin yesterday.    Past Medical History:        Diagnosis Date   ??? Cirrhosis (HCC)    ??? Hepatitis B    ??? Hepatitis C        Past Surgical History:        Procedure Laterality Date   ??? HERNIA REPAIR     ??? HERNIA REPAIR  07/09/2016   ??? KNEE SURGERY Right     arthroscopy    ??? PARACENTESIS Left 05/28/2018    total of 11,200 cc removed per Dr Floyde Parkins   ??? PARACENTESIS Left 06/04/2018    Total of per Dr. Floyde Parkins   ??? PARACENTESIS Left 06/09/2018    9350cc removed by Dr Ranelle Oyster [phone]   ??? PARACENTESIS Left 06/12/2018    Dr Floyde Parkins 5050 ml of fluid removed.   ??? PR LAP, INCISIONAL HERNIA REPAIR,REDUCIBLE N/A 07/09/2016    LAPAROSCOPIC INCISIONAL HERNIA REPAIR WITH MESH. POSS OPEN. performed by Loreli Slot, MD at [facility] OR       Home Medications:    No current facility-administered medications on file prior to encounter.      Current Outpatient Medications on File Prior to Encounter   Medication Sig Dispense Refill   ??? 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 ipratropium-albuterol (DUONEB) 0.5-2.5 (3) MG/3ML SOLN nebulizer solution Inhale 1 vial into the lungs every 6 hours as needed for Shortness of Breath     ??? melatonin 3 MG TABS tablet Take 6 mg by mouth daily     ??? omeprazole (PRILOSEC) 20 MG delayed release capsule Take 20 mg by mouth daily     ??? oxyCODONE (ROXICODONE) 5 MG immediate release tablet Take 15 mg by mouth every 4 hours as needed.      ??? spironolactone (ALDACTONE) 100 MG tablet Take 100 mg by mouth daily     ??? ibuprofen (ADVIL;MOTRIN) 800 MG tablet Take 1 tablet by mouth every 6 hours as needed for Pain 20 tablet 0   ??? triamcinolone (KENALOG) 0.025 % cream APP EXT AA BID  0   ??? polyethylene glycol (GLYCOLAX) powder take 17GM (DISSOLVED IN WATER) by mouth once daily  0   ??? ondansetron (ZOFRAN) 4 MG tablet Take 4 mg by mouth Daily      ??? baclofen (LIORESAL) 10 MG tablet Take 10 mg by mouth twice a week      ??? tenofovir (VIREAD)  300 MG tablet TK 1 T PO Daily  0   ??? furosemide (LASIX) 20 MG tablet Take 60 mg by mouth 2 times daily          Allergies:  Nubain [nalbuphine]; Morphine; Penicillin g; and Tylenol [acetaminophen]    Social History:    Social History     Socioeconomic History   ??? Marital status: Single     Spouse name: Not on file   ??? Number of children: Not on file   ??? Years of education: Not on file   ??? Highest education level: Not on file   Occupational History   ??? Not on file   Social Needs   ??? Financial resource strain: Not on file   ??? Food insecurity:     Worry: Not on file     Inability: Not on file   ??? Transportation needs:     Medical: Not on file     Non-medical: Not on file   Tobacco Use   ??? Smoking status: Current Every Day Smoker     Packs/day: 0.50     Types: Cigarettes   ??? Smokeless tobacco: Never Used   Substance and Sexual Activity   ??? Alcohol use: No   ??? Drug use: No   ??? Sexual activity: Not on file   Lifestyle   ??? Physical activity:     Days per week: Not on file     Minutes per session: Not  on file   ??? Stress: Not on file   Relationships   ??? Social connections:     Talks on phone: Not on file     Gets together: Not on file     Attends religious service: Not on file     Active member of club or organization: Not on file     Attends meetings of clubs or organizations: Not on file     Relationship status: Not on file   ??? Intimate partner violence:     Fear of current or ex partner: Not on file     Emotionally abused: Not on file     Physically abused: Not on file     Forced sexual activity: Not on file   Other Topics Concern   ??? Not on file   Social History Narrative   ??? Not on file       Family History:   Family History   Problem Relation Age of Onset   ??? Cancer Mother        Review of Systems:   Positives in bold  Constitutional: fever, chills, fatigue, malaise   HENT:  rhinorrhea, sinus pain, sore throat, epistaxis    Eyes:  photophobia, visual disturbance, eye redness  Respiratory: shortness of breath, cough, hemoptysis    Cardiovascular: chest pain, palpitations, orthopnea, leg swelling   Gastrointestinal: abdominal pain, nausea, vomiting, diarrhea, constipation   Endocrine: polydipsia, polyphagia, cold intolerance, heat intolerance  Genitourinary: dysuria, flank pain, frequency, urgency, scrotal swelling  Hematologic: easy bruising, easy bleeding  Musculoskeletal: back pain, neck pain, myalgias, arthalgias  Neurological: syncope, lightheadedness, dizziness, seizures, tremors, weakness  Psychiatric/Behavioral: anxiety, depression, hallucinations  Skin: rash, itching    Physical exam:   Constitutional:  VITALS:  BP (!) 93/46 Comment: notified nurse Kasey RN   Pulse 72    Temp 98.2 ??F (36.8 ??C) (Oral)    Resp 16    Ht 6\' 1"  (1.854 m)    Wt 216 lb (98 kg)    SpO2  100%    BMI 28.50 kg/m??     General: alert, in no apparent distress  HEENT: normocephalic, atraumatic, anicteric  Neck: supple, no mass  Lungs: non-labored respirations, clear to auscultation bilaterally  Heart: regular rate and rhythm, no  murmurs or rubs  Abdomen: soft, non-tender, non-distended  MSK: no joint swelling or tenderness  Ext: no cyanosis, no peripheral edema  Neuro: alert and oriented, no gross abnormalities  Psych: normal mood and affect    Data/  CBC:   Lab Results   Component Value Date    WBC 2.7 06/13/2018    RBC 3.33 06/13/2018    RBC 5.17 05/31/2016    HGB 7.3 06/13/2018    HCT 23.0 06/13/2018    MCV 69.0 06/13/2018    MCH 21.9 06/13/2018    MCHC 31.7 06/13/2018    RDW 23.4 06/13/2018    PLT 93 06/13/2018    MPV 9.6 06/27/2016     BMP:    Lab Results   Component Value Date    NA 132 06/13/2018    K 5.0 06/13/2018    CL 100 06/13/2018    CO2 22 06/13/2018    BUN 22 06/13/2018    LABALBU 2.8 06/13/2018    LABALBU 3.1 05/31/2016    CREATININE 1.35 06/13/2018    CALCIUM 8.2 06/13/2018    GFRAA >60.0 06/13/2018    LABGLOM 57.0 06/13/2018    GLUCOSE 98 06/13/2018    GLUCOSE 102 05/31/2016     Xr Chest Standard (2 Vw)    Result Date: 06/08/2018  Patient MRN: [id number] DOB: 07/17/1970 Age:  4 years Gender: Male Order Date: 06/08/2018 1:30 PM. Exam: XR CHEST (2 VW) Number of Views: 2 Indication:  High-grade fever, sore throat cough and severe neck pain x3 days. Cough and fever. Comparison: None Findings: Cardiomediastinal silhouette is within normal limits. Lungs are clear without evidence of infiltrate/pneumonia, pneumothorax or pleural effusion     Impression:  No radiographic evidence of acute cardiopulmonary disease     Mri Cervical Spine Wo Contrast    Result Date: 06/10/2018  Patient MRN: [id number] DOB: 1971-02-10 Age:  52 years Gender: Male Order Date: 06/10/2018 5:15 PM. Exam: MRI CERVICAL SPINE WO CONTRAST, MRI THORACIC SPINE WO CONTRAST, MRI LUMBAR SPINE WO CONTRAST Number of Views: 788 Indication:  Evaluate for osteomyelitis/discitis/abscess. Neck pain, mid back pain, neck and back pain x1 week. Comparison: CT abdomen and pelvis 06/07/2018 Findings: MRI of the cervical spine without contrast examination: Overall examination is limited  by motion artifact and lack of IV contrast administration. There is STIR hyperintensity noted at the vertebral bodies of C5 and C6. There is normal sagittal alignment cervical spine. C2-C3: No evidence of posterior disc contour abnormality, spinal canal stenosis, neural foraminal narrowing or spinal nerve root abutment/impingement.. C3-C4: Posterior disc osteophyte complex, bilateral uncinate hypertrophy, moderate bilateral neural foraminal narrowing. No frank spinal canal stenosis. C4-C5: Posterior disc osteophyte complex, intervertebral disc space narrowing, with bilateral uncinate hypertrophy. Moderate to moderately severe bilateral neural foraminal narrowing. No frank spinal canal stenosis. C5-C6: Intervertebral disc space narrowing. There is mildly elevated signal intensity at the intervertebral disc which appears asymmetric. Again, the examination is limited by the motion artifact. Posterior disc osteophyte complex, bilateral uncinate hypertrophy with moderate to moderately severe bilateral neural foraminal narrowing. No frank spinal canal stenosis. C6-C7: Posterior disc osteophyte complex, bilateral uncinate hypertrophy, moderately severe bilateral neural foraminal narrowing. Moderate spinal canal stenosis. C7-T1: Asymmetric disc osteophyte complex the right neural foraminal region limited  in evaluation... ` Moderate to moderately severe right neural foraminal narrowing. No frank spinal canal stenosis. There is no evidence of an epidural fluid collection or abscess. MRI of the thoracic spine without contrast examination: Overall examination is limited by motion artifact and lack of IV contrast administration. No STIR hyperintensity to just acute fracture or ligamentous injury. Normal sagittal alignment thoracic spine. Bone marrow signal intensity appears relatively homogeneous. Intrathoracic structures are nondiagnostic. Spleen appears to be enlarged but is incompletely evaluated. Liver is nondiagnostic.  Adrenal glands grossly unremarkable. Suspected right renal cyst incompletely characterized. Aorta is grossly unremarkable limited visualization. No evidence of an epidural fluid collection or abscess. No definitive evidence of restricted diffusion on this severely motion degraded exam within the thoracic spine. T1-T2: No evidence of posterior disc contour abnormality, spinal canal stenosis, neural foraminal narrowing or spinal nerve root abutment/impingement.. T2-T3: No evidence of posterior disc contour abnormality, spinal canal stenosis, neural foraminal narrowing or spinal nerve root abutment/impingement.. T3-T4: No evidence of posterior disc contour abnormality, spinal canal stenosis, neural foraminal narrowing or spinal nerve root abutment/impingement.. T4-T5: No evidence of posterior disc contour abnormality, spinal canal stenosis, neural foraminal narrowing or spinal nerve root abutment/impingement.. T5-T6: Small right paracentral disc protrusion is noted. No frank spinal canal stenosis. No neural foraminal narrowing. T6-T7: There is a central disc protrusion that measures approximately 0.6 cm anterior posterior by 0.56 cm transverse with mild mass effect on the ventral thoracic spinal cord. No neural foraminal narrowing. T7-T8: No evidence of posterior disc contour abnormality, spinal canal stenosis, neural foraminal narrowing or spinal nerve root abutment/impingement.. T8-T9: No evidence of posterior disc contour abnormality, spinal canal stenosis, neural foraminal narrowing or spinal nerve root abutment/impingement.. T9-T10: No evidence of posterior disc contour abnormality, spinal canal stenosis, neural foraminal narrowing or spinal nerve root abutment/impingement.. T10-T11: No evidence of posterior disc contour abnormality, spinal canal stenosis, neural foraminal narrowing or spinal nerve root abutment/impingement.. T11-T12: Moderate circumferential disc bulge. No frank spinal canal stenosis. MRI of the  lumbar spine without contrast examination: Overall examination is limited by motion artifact and lack of IV contrast administration. No evidence of STIR hyperintensity to suggest acute fracture or ligamentous injury. Conus medullaris terminates at approximately the T12-L1 motion segment level. Possible right renal cyst. Other intra-abdominal organs are nondiagnostic. No evidence of an  epidural fluid collection or abscess is seen. No evidence of restricted diffusion on this severely degraded exam by motion artifact within the lumbar spine. L1-L2: Mild to moderate circumferential disc bulge. Mild left and right neural foraminal narrowing. No evidence of spinal canal stenosis or spinal nerve abutment/impingement. L2-L3: Mild to moderate circumferential disc bulge. Mild to moderate left and right neural foraminal narrowing. No evidence of spinal canal stenosis or spinal nerve abutment/impingement. L3-L4: Moderate moderate circumferential disc bulge, moderate moderate bilateral facet osteoarthropathy, moderate left and right neural foraminal narrowing. No evidence of spinal canal stenosis or spinal nerve abutment/impingement. L4-L5: Moderate to moderately large circumferential disc bulge with moderate to moderately severe bilateral facet osteoarthropathy. Moderate left and right neural foraminal narrowing. No evidence of spinal canal stenosis or spinal nerve abutment/impingement. There is a left joint effusion at the L4-L5 articulating facet. L5-S1: Moderate circumferential disc bulge with a superimposed central and left paracentral disc protrusion. Moderate left and moderate to moderately severe right neural foraminal narrowing with abutment of the exiting right L5 spinal nerve root by the facet osteoarthropathy. Moderate bilateral facet osteoarthropathy. No frank spinal canal stenosis.     Overall examination of the cervical,  thoracic and lumbar spine is limited in interpretation evaluation by motion artifact and lack  of IV contrast administration. MRI of the cervical spine: 1. Limited examination secondary to motion artifact and lack of IV contrast administration. 2. There is STIR hyperintensity noted within the vertebral bodies of C5 and C6 with asymmetrically increased hyperintensity within the C5-C6 intervertebral disc space, findings are suspicious for osteomyelitis/discitis the C5-C6 intervertebral disc and the C5 and C6 vertebral bodies. 3. Degenerative changes noted C3-C7, with neural foraminal narrowing ranging from moderate to moderately severe. Moderate spinal canal stenosis is noted at C6-C7. Please see above for level by level details. MRI of the thoracic spine: 1. Limited examination secondary to motion artifact and lack of IV contrast administration. 2. There is no evidence of osteomyelitis, discitis or abscess within the MRI thoracic spine exam, given limitations imposed on interpretation and evaluation by lack of IV contrast administration. 3. Small T5-T6 right paracentral disc protrusion without frank spinal canal stenosis. 4. Central T6-T7 disc protrusion measuring approximately 0.6 cm anterior posterior by 0.56 cm transverse, with mild mass effect on the ventral thoracic spinal cord. 5. Moderate circumferential disc bulge at T11-T12. See above for other level by level evaluation. MRI of the lumbar spine: 1. Limited examination secondary to motion artifact and lack of IV contrast administration. 2. No evidence of osteomyelitis, discitis or abscess within the MRI lumbar spine exam, given limitations imposed on interpretation and evaluation by lack of IV contrast administration. 3. Multilevel degenerative disc disease and facet osteoarthropathy L1-S1, with possible abutment of the exiting right L5 spinal nerve root at L5-S1 level, by the facet osteoarthropathy. No spinal canal stenosis identified. Multilevel neural foraminal narrowing noted ranging from mild to moderate to moderately severe, see above for level  by level details. 4. Joint effusion at the left L4-L5 facet articulation.    Mri Thoracic Spine Wo Contrast    Result Date: 06/10/2018  Patient MRN: [id number] DOB: 1970/10/23 Age:  62 years Gender: Male Order Date: 06/10/2018 5:15 PM. Exam: MRI CERVICAL SPINE WO CONTRAST, MRI THORACIC SPINE WO CONTRAST, MRI LUMBAR SPINE WO CONTRAST Number of Views: 788 Indication:  Evaluate for osteomyelitis/discitis/abscess. Neck pain, mid back pain, neck and back pain x1 week. Comparison: CT abdomen and pelvis 06/07/2018 Findings: MRI of the cervical spine without contrast examination: Overall examination is limited by motion artifact and lack of IV contrast administration. There is STIR hyperintensity noted at the vertebral bodies of C5 and C6. There is normal sagittal alignment cervical spine. C2-C3: No evidence of posterior disc contour abnormality, spinal canal stenosis, neural foraminal narrowing or spinal nerve root abutment/impingement.. C3-C4: Posterior disc osteophyte complex, bilateral uncinate hypertrophy, moderate bilateral neural foraminal narrowing. No frank spinal canal stenosis. C4-C5: Posterior disc osteophyte complex, intervertebral disc space narrowing, with bilateral uncinate hypertrophy. Moderate to moderately severe bilateral neural foraminal narrowing. No frank spinal canal stenosis. C5-C6: Intervertebral disc space narrowing. There is mildly elevated signal intensity at the intervertebral disc which appears asymmetric. Again, the examination is limited by the motion artifact. Posterior disc osteophyte complex, bilateral uncinate hypertrophy with moderate to moderately severe bilateral neural foraminal narrowing. No frank spinal canal stenosis. C6-C7: Posterior disc osteophyte complex, bilateral uncinate hypertrophy, moderately severe bilateral neural foraminal narrowing. Moderate spinal canal stenosis. C7-T1: Asymmetric disc osteophyte complex the right neural foraminal region limited in evaluation... ` Moderate  to moderately severe right neural foraminal narrowing. No frank spinal canal stenosis. There is no evidence of an epidural fluid collection or abscess. MRI of the  thoracic spine without contrast examination: Overall examination is limited by motion artifact and lack of IV contrast administration. No STIR hyperintensity to just acute fracture or ligamentous injury. Normal sagittal alignment thoracic spine. Bone marrow signal intensity appears relatively homogeneous. Intrathoracic structures are nondiagnostic. Spleen appears to be enlarged but is incompletely evaluated. Liver is nondiagnostic. Adrenal glands grossly unremarkable. Suspected right renal cyst incompletely characterized. Aorta is grossly unremarkable limited visualization. No evidence of an epidural fluid collection or abscess. No definitive evidence of restricted diffusion on this severely motion degraded exam within the thoracic spine. T1-T2: No evidence of posterior disc contour abnormality, spinal canal stenosis, neural foraminal narrowing or spinal nerve root abutment/impingement.. T2-T3: No evidence of posterior disc contour abnormality, spinal canal stenosis, neural foraminal narrowing or spinal nerve root abutment/impingement.. T3-T4: No evidence of posterior disc contour abnormality, spinal canal stenosis, neural foraminal narrowing or spinal nerve root abutment/impingement.. T4-T5: No evidence of posterior disc contour abnormality, spinal canal stenosis, neural foraminal narrowing or spinal nerve root abutment/impingement.. T5-T6: Small right paracentral disc protrusion is noted. No frank spinal canal stenosis. No neural foraminal narrowing. T6-T7: There is a central disc protrusion that measures approximately 0.6 cm anterior posterior by 0.56 cm transverse with mild mass effect on the ventral thoracic spinal cord. No neural foraminal narrowing. T7-T8: No evidence of posterior disc contour abnormality, spinal canal stenosis, neural foraminal  narrowing or spinal nerve root abutment/impingement.. T8-T9: No evidence of posterior disc contour abnormality, spinal canal stenosis, neural foraminal narrowing or spinal nerve root abutment/impingement.. T9-T10: No evidence of posterior disc contour abnormality, spinal canal stenosis, neural foraminal narrowing or spinal nerve root abutment/impingement.. T10-T11: No evidence of posterior disc contour abnormality, spinal canal stenosis, neural foraminal narrowing or spinal nerve root abutment/impingement.. T11-T12: Moderate circumferential disc bulge. No frank spinal canal stenosis. MRI of the lumbar spine without contrast examination: Overall examination is limited by motion artifact and lack of IV contrast administration. No evidence of STIR hyperintensity to suggest acute fracture or ligamentous injury. Conus medullaris terminates at approximately the T12-L1 motion segment level. Possible right renal cyst. Other intra-abdominal organs are nondiagnostic. No evidence of an  epidural fluid collection or abscess is seen. No evidence of restricted diffusion on this severely degraded exam by motion artifact within the lumbar spine. L1-L2: Mild to moderate circumferential disc bulge. Mild left and right neural foraminal narrowing. No evidence of spinal canal stenosis or spinal nerve abutment/impingement. L2-L3: Mild to moderate circumferential disc bulge. Mild to moderate left and right neural foraminal narrowing. No evidence of spinal canal stenosis or spinal nerve abutment/impingement. L3-L4: Moderate moderate circumferential disc bulge, moderate moderate bilateral facet osteoarthropathy, moderate left and right neural foraminal narrowing. No evidence of spinal canal stenosis or spinal nerve abutment/impingement. L4-L5: Moderate to moderately large circumferential disc bulge with moderate to moderately severe bilateral facet osteoarthropathy. Moderate left and right neural foraminal narrowing. No evidence of spinal  canal stenosis or spinal nerve abutment/impingement. There is a left joint effusion at the L4-L5 articulating facet. L5-S1: Moderate circumferential disc bulge with a superimposed central and left paracentral disc protrusion. Moderate left and moderate to moderately severe right neural foraminal narrowing with abutment of the exiting right L5 spinal nerve root by the facet osteoarthropathy. Moderate bilateral facet osteoarthropathy. No frank spinal canal stenosis.     Overall examination of the cervical, thoracic and lumbar spine is limited in interpretation evaluation by motion artifact and lack of IV contrast administration. MRI of the cervical spine: 1. Limited examination secondary to motion artifact  and lack of IV contrast administration. 2. There is STIR hyperintensity noted within the vertebral bodies of C5 and C6 with asymmetrically increased hyperintensity within the C5-C6 intervertebral disc space, findings are suspicious for osteomyelitis/discitis the C5-C6 intervertebral disc and the C5 and C6 vertebral bodies. 3. Degenerative changes noted C3-C7, with neural foraminal narrowing ranging from moderate to moderately severe. Moderate spinal canal stenosis is noted at C6-C7. Please see above for level by level details. MRI of the thoracic spine: 1. Limited examination secondary to motion artifact and lack of IV contrast administration. 2. There is no evidence of osteomyelitis, discitis or abscess within the MRI thoracic spine exam, given limitations imposed on interpretation and evaluation by lack of IV contrast administration. 3. Small T5-T6 right paracentral disc protrusion without frank spinal canal stenosis. 4. Central T6-T7 disc protrusion measuring approximately 0.6 cm anterior posterior by 0.56 cm transverse, with mild mass effect on the ventral thoracic spinal cord. 5. Moderate circumferential disc bulge at T11-T12. See above for other level by level evaluation. MRI of the lumbar spine: 1. Limited  examination secondary to motion artifact and lack of IV contrast administration. 2. No evidence of osteomyelitis, discitis or abscess within the MRI lumbar spine exam, given limitations imposed on interpretation and evaluation by lack of IV contrast administration. 3. Multilevel degenerative disc disease and facet osteoarthropathy L1-S1, with possible abutment of the exiting right L5 spinal nerve root at L5-S1 level, by the facet osteoarthropathy. No spinal canal stenosis identified. Multilevel neural foraminal narrowing noted ranging from mild to moderate to moderately severe, see above for level by level details. 4. Joint effusion at the left L4-L5 facet articulation.    Mri Lumbar Spine Wo Contrast    Result Date: 06/10/2018  Patient MRN: [id number] DOB: [birth year] Age:  70 years Gender: Male Order Date: 06/10/2018 5:15 PM. Exam: MRI CERVICAL SPINE WO CONTRAST, MRI THORACIC SPINE WO CONTRAST, MRI LUMBAR SPINE WO CONTRAST Number of Views: [id number] Indication:  Evaluate for osteomyelitis/discitis/abscess. Neck pain, mid back pain, neck and back pain x1 week. Comparison: CT abdomen and pelvis 06/07/2018 Findings: MRI of the cervical spine without contrast examination: Overall examination is limited by motion artifact and lack of IV contrast administration. There is STIR hyperintensity noted at the vertebral bodies of C5 and C6. There is normal sagittal alignment cervical spine. C2-C3: No evidence of posterior disc contour abnormality, spinal canal stenosis, neural foraminal narrowing or spinal nerve root abutment/impingement.. C3-C4: Posterior disc osteophyte complex, bilateral uncinate hypertrophy, moderate bilateral neural foraminal narrowing. No frank spinal canal stenosis. C4-C5: Posterior disc osteophyte complex, intervertebral disc space narrowing, with bilateral uncinate hypertrophy. Moderate to moderately severe bilateral neural foraminal narrowing. No frank spinal canal stenosis. C5-C6: Intervertebral disc space  narrowing. There is mildly elevated signal intensity at the intervertebral disc which appears asymmetric. Again, the examination is limited by the motion artifact. Posterior disc osteophyte complex, bilateral uncinate hypertrophy with moderate to moderately severe bilateral neural foraminal narrowing. No frank spinal canal stenosis. C6-C7: Posterior disc osteophyte complex, bilateral uncinate hypertrophy, moderately severe bilateral neural foraminal narrowing. Moderate spinal canal stenosis. C7-T1: Asymmetric disc osteophyte complex the right neural foraminal region limited in evaluation... ` Moderate to moderately severe right neural foraminal narrowing. No frank spinal canal stenosis. There is no evidence of an epidural fluid collection or abscess. MRI of the thoracic spine without contrast examination: Overall examination is limited by motion artifact and lack of IV contrast administration. No STIR hyperintensity to just acute fracture or ligamentous injury. Normal sagittal alignment  thoracic spine. Bone marrow signal intensity appears relatively homogeneous. Intrathoracic structures are nondiagnostic. Spleen appears to be enlarged but is incompletely evaluated. Liver is nondiagnostic. Adrenal glands grossly unremarkable. Suspected right renal cyst incompletely characterized. Aorta is grossly unremarkable limited visualization. No evidence of an epidural fluid collection or abscess. No definitive evidence of restricted diffusion on this severely motion degraded exam within the thoracic spine. T1-T2: No evidence of posterior disc contour abnormality, spinal canal stenosis, neural foraminal narrowing or spinal nerve root abutment/impingement.. T2-T3: No evidence of posterior disc contour abnormality, spinal canal stenosis, neural foraminal narrowing or spinal nerve root abutment/impingement.. T3-T4: No evidence of posterior disc contour abnormality, spinal canal stenosis, neural foraminal narrowing or spinal nerve  root abutment/impingement.. T4-T5: No evidence of posterior disc contour abnormality, spinal canal stenosis, neural foraminal narrowing or spinal nerve root abutment/impingement.. T5-T6: Small right paracentral disc protrusion is noted. No frank spinal canal stenosis. No neural foraminal narrowing. T6-T7: There is a central disc protrusion that measures approximately 0.6 cm anterior posterior by 0.56 cm transverse with mild mass effect on the ventral thoracic spinal cord. No neural foraminal narrowing. T7-T8: No evidence of posterior disc contour abnormality, spinal canal stenosis, neural foraminal narrowing or spinal nerve root abutment/impingement.. T8-T9: No evidence of posterior disc contour abnormality, spinal canal stenosis, neural foraminal narrowing or spinal nerve root abutment/impingement.. T9-T10: No evidence of posterior disc contour abnormality, spinal canal stenosis, neural foraminal narrowing or spinal nerve root abutment/impingement.. T10-T11: No evidence of posterior disc contour abnormality, spinal canal stenosis, neural foraminal narrowing or spinal nerve root abutment/impingement.. T11-T12: Moderate circumferential disc bulge. No frank spinal canal stenosis. MRI of the lumbar spine without contrast examination: Overall examination is limited by motion artifact and lack of IV contrast administration. No evidence of STIR hyperintensity to suggest acute fracture or ligamentous injury. Conus medullaris terminates at approximately the T12-L1 motion segment level. Possible right renal cyst. Other intra-abdominal organs are nondiagnostic. No evidence of an  epidural fluid collection or abscess is seen. No evidence of restricted diffusion on this severely degraded exam by motion artifact within the lumbar spine. L1-L2: Mild to moderate circumferential disc bulge. Mild left and right neural foraminal narrowing. No evidence of spinal canal stenosis or spinal nerve abutment/impingement. L2-L3: Mild to  moderate circumferential disc bulge. Mild to moderate left and right neural foraminal narrowing. No evidence of spinal canal stenosis or spinal nerve abutment/impingement. L3-L4: Moderate moderate circumferential disc bulge, moderate moderate bilateral facet osteoarthropathy, moderate left and right neural foraminal narrowing. No evidence of spinal canal stenosis or spinal nerve abutment/impingement. L4-L5: Moderate to moderately large circumferential disc bulge with moderate to moderately severe bilateral facet osteoarthropathy. Moderate left and right neural foraminal narrowing. No evidence of spinal canal stenosis or spinal nerve abutment/impingement. There is a left joint effusion at the L4-L5 articulating facet. L5-S1: Moderate circumferential disc bulge with a superimposed central and left paracentral disc protrusion. Moderate left and moderate to moderately severe right neural foraminal narrowing with abutment of the exiting right L5 spinal nerve root by the facet osteoarthropathy. Moderate bilateral facet osteoarthropathy. No frank spinal canal stenosis.     Overall examination of the cervical, thoracic and lumbar spine is limited in interpretation evaluation by motion artifact and lack of IV contrast administration. MRI of the cervical spine: 1. Limited examination secondary to motion artifact and lack of IV contrast administration. 2. There is STIR hyperintensity noted within the vertebral bodies of C5 and C6 with asymmetrically increased hyperintensity within the C5-C6 intervertebral disc space, findings  are suspicious for osteomyelitis/discitis the C5-C6 intervertebral disc and the C5 and C6 vertebral bodies. 3. Degenerative changes noted C3-C7, with neural foraminal narrowing ranging from moderate to moderately severe. Moderate spinal canal stenosis is noted at C6-C7. Please see above for level by level details. MRI of the thoracic spine: 1. Limited examination secondary to motion artifact and lack of  IV contrast administration. 2. There is no evidence of osteomyelitis, discitis or abscess within the MRI thoracic spine exam, given limitations imposed on interpretation and evaluation by lack of IV contrast administration. 3. Small T5-T6 right paracentral disc protrusion without frank spinal canal stenosis. 4. Central T6-T7 disc protrusion measuring approximately 0.6 cm anterior posterior by 0.56 cm transverse, with mild mass effect on the ventral thoracic spinal cord. 5. Moderate circumferential disc bulge at T11-T12. See above for other level by level evaluation. MRI of the lumbar spine: 1. Limited examination secondary to motion artifact and lack of IV contrast administration. 2. No evidence of osteomyelitis, discitis or abscess within the MRI lumbar spine exam, given limitations imposed on interpretation and evaluation by lack of IV contrast administration. 3. Multilevel degenerative disc disease and facet osteoarthropathy L1-S1, with possible abutment of the exiting right L5 spinal nerve root at L5-S1 level, by the facet osteoarthropathy. No spinal canal stenosis identified. Multilevel neural foraminal narrowing noted ranging from mild to moderate to moderately severe, see above for level by level details. 4. Joint effusion at the left L4-L5 facet articulation.    Ct Abdomen Pelvis W Iv Contrast Additional Contrast? None    Result Date: 06/08/2018  Patient MRN: [id number] DOB: 1970/08/17 Age:  3 years Order Date: 06/07/2018 4:30 PM. Gender: Male Exam: CT ABDOMEN PELVIS W IV CONTRAST Number of Images: [id number] Indication:   Abdominal pain, swelling, testicular swelling, history of cirrhosis Contrast dosage: Isovue-370, 100 mL, IV Comparison: None Findings: This examination was performed on a CT scanner with dose reduction. Technique: Low-dose CT  acquisition technique included one of following options; 1 . Automated exposure control, 2. Adjustment of MA and or KV according to patient's size or 3. Use of iterative  reconstruction. Visualized lung bases demonstrate no evidence of noncalcified pulmonary nodule, spiculated mass, pneumothorax, hemothorax, pleural effusion or focal areas of airspace consolidation.. Large volume intraperitoneal ascites with a nodular contour of the shrunken appearing liver. Splenomegaly. Mild sized hiatal hernia. Esophageal varices noted. Pancreas, adrenal glands and kidneys unremarkable. Appendix not identified. Fluid extends into a large right inguinal hernia and into the scrotum where there are large bilateral fluid collections. No definitive evidence of bowel obstruction or free intraperitoneal air. Delayed imaging sequences demonstrate there is partial opacification of the renal, ureteral or bladder areas. No large filling defect or mass is seen. No contrast extravasation outside normal renal, ureteral or bladder confines. There are portions of the  renal, ureteral or bladder systems are not opacified and are therefore not evaluated.. Vertebral body heights and intervertebral disc spaces are well-preserved. There is normal sagittal alignment of the visualized spine. No lytic or blastic bony lesions..     1. Findings consistent with decompensated cirrhosis with large volume intraperitoneal ascites, a shrunken liver with nodular contour, splenomegaly and esophageal variceal formation. 2. No evidence of bowel obstruction. 3. There is a large fluid-filled right inguinal hernia with large amounts of fluid extending within the scrotum.    Ir Picc Equal Or Greater Than 5 Years    Result Date: 06/11/2018  PERIPHERALLY INSERTED CENTRAL CATHETER (PICC) PLACEMENT WITH ULTRASOUND GUIDANCE CLINICAL HISTORY:  PATIENT WITH MRSA BACTEREMIA, PHYSICIAN REQUESTS PLACEMENT OF PICC LINE FOR LONG-TERM IV ANTIBIOTIC THERAPY After discussing the procedure and possible complications with the patient, informed consent was obtained.  The patient was placed on the Special Procedures table.  The right upper extremity was  sterilely prepared using 2% chlorhexidine. Central venous catheter was inserted using a maximal sterile barrier technique which includes cap, mask, sterile gown, sterile gloves, sterile full-body drape, hand hygiene and 2% chlorhexidine for skin antisepsis. A sterile ultrasound technique with sterile gel and sterile probe covers was also utilized.  A pre-procedure time out was performed in order to assure the correct patient and procedure.  Local anesthetic was administered.   Utilizing sterile gel and sterile probe covers, a peripheral vein was accessed with sonographic guidance.  A sonographic spot image was obtained for documentation.  A guidewire was advanced into the vein with fluoroscopic guidance and a sheath was placed over the guidewire.  A 5-French dual-lumen PICC was advanced through the sheath, up the arm and into the central vasculature.  It was positioned appropriately.  The sheath was removed.  The catheter was shown to aspirate and infuse properly.  The flange of the catheter was affixed to the arm using a PICC securement device.  A spot image of the chest showed the tip of the PICC line to lie in the superior vena cava.   The patient tolerated the procedure well and without complications.  Number of films: 1 Fluoroscopy time: 122.6 seconds seconds CONCLUSION: SUCCESSFUL RUE PICC PLACEMENT WITHOUT IMMEDIATE COMPLICATIONS.     US Guided Paracentesis    1.  Status post technically successful ultrasound-guided paracentesis.  CLINICAL HISTORY:Sandip BORNTREGER is a Male of 18 years age, referred for Ultrasound Guided Paracentesis.  PROCEDURE: Survey of the abdomen showed large amount of ascites fluid. After obtaining informed consent, the patient was positioned supine on the sonography table. Using ultrasound, the skin over the left hemiabdomen was locally anesthetized with 1% lidocaine.  Following that, a Yueh needle was advanced into the fluid pocket using ultrasound visualization.  5500cc, of slightly  cloudy fluid were aspirated and sent for cytology, and pathology.  The needle was removed, and hemostasis was obtained with pressure.  A Band-Aid was placed.  Post procedure images did not demonstrate hemorrhage at the target site. The patient tolerated the procedure well. The patient left the department in good condition. A radiology nurse was in presence monitoring vital signs, assisting throughout the procedure.     US Guided Paracentesis    1.  Status post technically successful ultrasound-guided paracentesis.  CLINICAL HISTORY:Dylan GOSWAMI is a Male of 71 years age, referred for Ultrasound Guided Paracentesis.  PROCEDURE: Survey of the abdomen showed large amount of ascites fluid. After obtaining informed consent, the patient was positioned supine on the sonography table. Using ultrasound, the skin over the left hemiabdomen was locally anesthetized with 1% lidocaine.  Following that, a Yueh needle was advanced into the fluid pocket using ultrasound visualization.  9340cc, of turbid white fluid were aspirated and sent for cytology, and pathology.  The needle was removed, and hemostasis was obtained with pressure.  A Band-Aid was placed.  Post procedure images did not demonstrate hemorrhage at the target site. The patient tolerated the procedure well. The patient left the department in good condition. A radiology nurse was in presence monitoring vital signs, assisting throughout the procedure.     US Guided Paracentesis    1.  Status post technically successful ultrasound-guided paracentesis.  CLINICAL  HISTORY:Finnean Christell FaithRCHER is a Male of [age] years age, referred for Ultrasound Guided Paracentesis.  PROCEDURE: Survey of the abdomen showed large amount of ascites fluid. After obtaining informed consent, the patient was positioned supine on the sonography table. Using ultrasound, the skin over the left hemiabdomen was locally anesthetized with 1% lidocaine.  Following that, a Yueh needle was advanced into the fluid  pocket using ultrasound visualization.  11,550cc, of slightly turbid fluid were aspirated and sent for cytology, and pathology.  The needle was removed, and hemostasis was obtained with pressure.  A Band-Aid was placed.  Post procedure images did not demonstrate hemorrhage at the target site. The patient tolerated the procedure well. The patient left the department in good condition. A radiology nurse was in presence monitoring vital signs, assisting throughout the procedure.     [place] Guided Paracentesis    1.  Status post technically successful ultrasound-guided paracentesis.  CLINICAL HISTORY:Maclean Christell FaithRCHER is a Male of [age] years age, referred for Ultrasound Guided Paracentesis.  PROCEDURE: Survey of the abdomen showed large amount of ascites fluid. After obtaining informed consent, the patient was positioned supine on the sonography table. Using ultrasound, the skin over the left hemiabdomen was locally anesthetized with 1% lidocaine.  Following that, a Yueh needle was advanced into the fluid pocket using ultrasound visualization.  11,200cc, of slightly turbid fluid were aspirated and sent for cytology, and pathology.  The needle was removed, and hemostasis was obtained with pressure.  A Band-Aid was placed.  Post procedure images did not demonstrate hemorrhage at the target site. The patient tolerated the procedure well. The patient left the department in good condition. A radiology nurse was in presence monitoring vital signs, assisting throughout the procedure.     Mri Brain W Wo Contrast    Result Date: 06/12/2018  MRI BRAIN W WO CONTRAST : 06/12/2018 1:15 PM CLINICAL HISTORY:  right sided weakness in the setting of OM, r/o abscess . COMPARISON: Cervical, thoracic and lumbar spine MRIs 06/10/2018. TECHNIQUE: Multiplanar MR imaging of the head was performed before and after intravenous administration of approximately 15 mL of ProHance gadolinium contrast. FINDINGS: Motion artifact limits some sequences. There is no  infarct, intracranial hemorrhage, mass effect, midline shift, extra-axial collection, hydrocephalus, or abnormal enhancement.  Minimal to mild nonspecific predominantly subcortical supratentorial white matter changes are most likely chronic small vessel ischemic disease.     NO ACUTE INTRACRANIAL PROCESS IDENTIFIED.     [place] Chest Including Mediastinum    1.  No evidence of pleural effusion in the bilateral thoracic cavities. When compared to thoracic ultrasound dating May 27, 2018, what was seen is probable pleural effusion was most likely extensive ascites. COMPARISON: May 27, 2018 DIAGNOSIS: Ascites and alcoholic liver cirrhosis COMMENTS: None TECHNIQUE: Limited bilateral thoracic ultrasound FINDINGS: Limited bilateral thoracic ultrasound demonstrates no evidence of pleural effusion. When compared to prior study, what was seen is pleural effusion was most likely the extensively large ascites from the abdomen which was under the diaphragm.     [place] Chest Including Mediastinum    1.  Limited ultrasound of the bilateral thoracic cavities demonstrates bilateral moderate to large pleural effusions with some septations and loculations. COMPARISON:No prior studies are available for comparison. DIAGNOSIS: Pleural effusion COMMENTS: None TECHNIQUE: Limited ultrasound of the bilateral thoracic cavities FINDINGS: Ultrasound of the bilateral thoracic cavities demonstrates moderate to large bilateral pleural effusions. The effusions demonstrate some septations and loculations.       Assessment:  48 y.o. year old male with  history s/f HBV/HCV cirrhosis c/b portal HTN, ascites who was admitted on 06/07/18 for abdominal pain and scrotal swelling in setting of decompensated cirrhosis. Course c/b recurrent ascites requiring paracenteses (02/24 and 02/27, 9 and 5.5 L respectively, no SBP), MSSA bacteremia c/b C5-C6 OM/discites on abx, RUE/RLE weakness and sensory loss in setting of disc protrusions w/ mass effect and now AKI  for which nephrology has been consulted.    1. AKI: most likely 2/2 hypotension in setting of paracenteses and diuretics nml renal function at baseline   2. Decompensated liver cirrhosis (HBV/HCV)  3. Hyponatremia: likely hypervolemic hyponatremia in setting of liver disease, stable in the high 120s/low 130s  4. MSSA C5,6 OM/discitis    Plan:  - agree w/ stopping diuretics  - will give another dose of albumin 25 g today x1   - checking urine studies   - adding fluid restriction of 1.5L (pt drinks a lot of water, possibly contributing to large volume paracenteses)    Thank you for the consultation. Will continue to follow  Please do not hesitate to call with questions.    Alex Gardener, MD

## 2018-06-13 NOTE — Progress Notes (Signed)
Infectious Diseases Inpatient Progress Note          HISTORY OF PRESENT ILLNESS:  Follow up MSSA bacteremia with Flu like sxs and decompensated liver cirrhosis. Had 5.5 lit paracentesis yesterday, has abdominal soreness, cough.  on IV Vanco, well tolerated. Patient had remarkable clinical improvement with decreased fevers. Blood Cx + MSSA.  MRI C spine with possible C5 discitis. Had anxiety attack last night. + neck pain.     Current Medications:    ??? methocarbamol  750 mg Oral TID   ??? vancomycin  1,000 mg Intravenous Q12H   ??? sodium chloride flush  10 mL Intravenous 2 times per day   ??? gabapentin  100 mg Oral TID   ??? [Held by provider] furosemide  40 mg Oral Daily   ??? ferrous sulfate  325 mg Oral Daily with breakfast   ??? melatonin  10 mg Oral Daily   ??? [Held by provider] spironolactone  100 mg Oral Daily   ??? pantoprazole  40 mg Oral QAM AC   ??? lidocaine  3 patch Transdermal Daily   ??? vancomycin (VANCOCIN) intermittent dosing Leisure centre manager)   Other RX Placeholder   ??? sodium chloride flush  10 mL Intravenous 2 times per day   ??? [Held by provider] enoxaparin  40 mg Subcutaneous Daily   ??? nicotine  1 patch Transdermal Daily   ??? lactulose  20 g Oral TID   ??? tenofovir  300 mg Oral Daily       Allergies:  Nubain [nalbuphine]; Morphine; Penicillin g; and Tylenol [acetaminophen]      Review of Systems  14 system review is negative other than HPI    Physical Exam   Vitals:    06/13/18 0026 06/13/18 0428 06/13/18 0746 06/13/18 0752   BP: (!) 96/59 (!) 97/54  (!) 97/48   Pulse: 76 68  66   Resp: 18 18 18 18    Temp: 97.3 ??F (36.3 ??C) 98.6 ??F (37 ??C)  97.3 ??F (36.3 ??C)   TempSrc: Oral Oral Oral Oral   SpO2: 100% 100%     Weight:       Height:           General Appearance: alert and oriented to person, place and time, well-developed and well-nourished, in no acute distress  Skin: warm and dry, no rash.   Head: normocephalic and atraumatic  Eyes: extraocular eye movements intact, conjunctivae normal, icteric sclerae  ENT:  oropharynx clear and moist with normal mucous membranes. No thrush  Lungs: normal respiratory effort, Clear Lungs, no rhonchi, no crackles, no wheezes  Heart:RRR, nl S1/S2, no murmur  Abdomen: soft, + diffuse tenderness, less distended with ascites, + BS  NEUROLOGICAL: alert and oriented x 3, no focal deficits  trace B leg edema  No erythema, no warmth, no tenderness    DATA:    Lab Results   Component Value Date    WBC 2.7 (L) 06/13/2018    HGB 7.3 (L) 06/13/2018    HCT 23.0 (L) 06/13/2018    MCV 69.0 (L) 06/13/2018    PLT 93 (L) 06/13/2018     Lab Results   Component Value Date    CREATININE 1.35 (H) 06/13/2018    BUN 22 (H) 06/13/2018    NA 132 (L) 06/13/2018    K 5.0 (H) 06/13/2018    CL 100 06/13/2018    CO2 22 06/13/2018       Hepatic Function Panel:  Lab Results   Component Value  Date    ALKPHOS 132 06/13/2018    ALT 26 06/13/2018    AST 51 06/13/2018    PROT 6.4 06/13/2018    BILITOT 0.6 06/13/2018    LABALBU 2.8 06/13/2018    LABALBU 3.1 05/31/2016       Microbiology:   No results for input(s): BC in the last 72 hours.  No results for input(s): BLOODCULT2 in the last 72 hours.Summary   Limited echo for evaluation for endocarditis.   No obvious valve vegetations or regurgitation.   Only fair quality study, could miss small vegetation.   Consider alternate modality if clinically indicated like TEE.   Signature   MRI of the cervical spine: 1. Limited examination secondary to motion artifact and lack of IV contrast administration. 2. There is STIR hyperintensity noted within the vertebral bodies of C5 and C6 with asymmetrically increased hyperintensity within the C5-C6 intervertebral disc space, findings are suspicious for osteomyelitis/discitis the C5-C6 intervertebral disc and the C5 and C6 vertebral bodies. 3. Degenerative changes noted C3-C7, with neural foraminal narrowing ranging from moderate to moderately severe. Moderate spinal canal stenosis is noted at C6-C7. Please see above for level by level  details.       IMPRESSION:    ?? 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 AKI  ?? Decompensated liver cirrhosis with reaccumulating fluid  ?? Acute bronchitis, Flu like illness, no pneumonia    Patient Active Problem List   Diagnosis   ??? Recurrent incisional hernia   ??? Cirrhosis (HCC)   ??? Abdominal pain   ??? Ascites of liver   ??? Visceral hyperalgesia   ??? Generalized muscle ache   ??? MSSA bacteremia   ??? Osteomyelitis of cervical vertebra (HCC)   ??? Cervicalgia   ??? 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 Displacement of thoracic intervertebral disc without myelopathy   ??? Degeneration of thoracic intervertebral disc   ??? Degeneration of lumbar intervertebral disc   ??? Muscle weakness of upper extremity       PLAN:  ?? SNF for 6 weeks IV Vanco  ?? PO antibiotics for 6 weeks afterward for discitis  ?? F/U Vanco trough CBC BMP CRP   ?? F/U with Hepatology    Discussed with patient    Eudelia Bunch, MD

## 2018-06-13 NOTE — Plan of Care (Signed)
Problem: Pain:  Goal: Pain level will decrease  Description  Pain level will decrease  06/13/2018 0119 by Merri Brunette, RN  Outcome: Ongoing  06/12/2018 1808 by Meryl Crutch, RN  Outcome: Ongoing  Goal: Control of acute pain  Description  Control of acute pain  06/13/2018 0119 by Merri Brunette, RN  Outcome: Ongoing  06/12/2018 1808 by Meryl Crutch, RN  Outcome: Ongoing  Goal: Control of chronic pain  Description  Control of chronic pain  06/13/2018 0119 by Merri Brunette, RN  Outcome: Ongoing  06/12/2018 1808 by Meryl Crutch, RN  Outcome: Ongoing     Problem: Falls - Risk of:  Goal: Will remain free from falls  Description  Will remain free from falls  06/13/2018 0119 by Merri Brunette, RN  Outcome: Ongoing  06/12/2018 1808 by Meryl Crutch, RN  Outcome: Ongoing  Goal: Absence of physical injury  Description  Absence of physical injury  06/13/2018 0119 by Merri Brunette, RN  Outcome: Ongoing  06/12/2018 1808 by Meryl Crutch, RN  Outcome: Ongoing     Problem: Activity:  Goal: Fatigue will decrease  Description  Fatigue will decrease  06/13/2018 0119 by Merri Brunette, RN  Outcome: Ongoing  06/12/2018 1808 by Meryl Crutch, RN  Outcome: Ongoing  Goal: Ability to tolerate increased activity will improve  Description  Ability to tolerate increased activity will improve  06/13/2018 0119 by Merri Brunette, RN  Outcome: Ongoing  06/12/2018 1808 by Meryl Crutch, RN  Outcome: Ongoing  Goal: Ability to maintain optimal joint mobility will improve  Description  Ability to maintain optimal joint mobility will improve  06/13/2018 0119 by Merri Brunette, RN  Outcome: Ongoing  06/12/2018 1808 by Meryl Crutch, RN  Outcome: Ongoing     Problem: Bowel/Gastric:  Goal: Ability to achieve a regular elimination pattern will improve  Description  Ability to achieve a regular elimination pattern will improve  06/13/2018 0119 by Merri Brunette, RN  Outcome: Ongoing  06/12/2018 1808 by Meryl Crutch, RN  Outcome:  Ongoing     Problem: Cardiac:  Goal: Ability to maintain an adequate cardiac output will improve  Description  Ability to maintain an adequate cardiac output will improve  06/13/2018 0119 by Merri Brunette, RN  Outcome: Ongoing  06/12/2018 1808 by Meryl Crutch, RN  Outcome: Ongoing  Goal: Ability to maintain adequate ventilation will improve  Description  Ability to maintain adequate ventilation will improve  06/13/2018 0119 by Merri Brunette, RN  Outcome: Ongoing  06/12/2018 1808 by Meryl Crutch, RN  Outcome: Ongoing  Goal: Ability to achieve and maintain adequate cardiopulmonary perfusion will improve  Description  Ability to achieve and maintain adequate cardiopulmonary perfusion will improve  06/13/2018 0119 by Merri Brunette, RN  Outcome: Ongoing  06/12/2018 1808 by Meryl Crutch, RN  Outcome: Ongoing     Problem: Mobility - Impaired:  Goal: Mobility will improve  Description  Therapy evaluation completed.  Please see daily notes and/or progress notes for details related to planned treatment interventions, goals and functional abilities.     06/13/2018 0119 by Merri Brunette, RN  Outcome: Ongoing  06/12/2018 1808 by Meryl Crutch, RN  Outcome: Ongoing     Problem: IP BALANCE  Goal: LTG - patient will maintain standing balance to allow for completion of daily activities  06/13/2018 0119 by Merri Brunette, RN  Outcome: Ongoing  06/12/2018 1808 by Meryl Crutch, RN  Outcome: Ongoing

## 2018-06-13 NOTE — Care Coordination-Inpatient (Signed)
Met with pt who confirmed DC to [facility], although he would consider DC to [facility] if that LOC is indicated.  Reviewed IMM.

## 2018-06-13 NOTE — Progress Notes (Signed)
Pt c/o neck pain and unable to move it. Notified Dr. Durwin Reges who came and seen him. New orders given and medicated per MAR. Pt asleep with respiration greater than 10. Will continue to monitor. Electronically signed by Merri Brunette, RN on 06/13/2018 at 6:05 AM

## 2018-06-14 LAB — COMPREHENSIVE METABOLIC PANEL W/ REFLEX TO MG FOR LOW K
ALT: 28 U/L (ref 0–41)
AST: 60 U/L — ABNORMAL HIGH (ref 0–40)
Albumin: 2.9 g/dL — ABNORMAL LOW (ref 3.5–4.6)
Alkaline Phosphatase: 134 U/L — ABNORMAL HIGH (ref 35–104)
Anion Gap: 12 mEq/L (ref 9–15)
BUN: 23 mg/dL — ABNORMAL HIGH (ref 6–20)
CO2: 21 mEq/L (ref 20–31)
Calcium: 8.3 mg/dL — ABNORMAL LOW (ref 8.5–9.9)
Chloride: 99 mEq/L (ref 95–107)
Creatinine: 1.19 mg/dL (ref 0.70–1.20)
GFR African American: >60 (ref >60)
GFR Non-African American: >60 (ref >60)
Globulin: 3.6 g/dL — ABNORMAL HIGH (ref 2.3–3.5)
Glucose: 191 mg/dL — ABNORMAL HIGH (ref 70–99)
Potassium reflex Magnesium: 4.1 mEq/L (ref 3.4–4.9)
Sodium: 132 mEq/L — ABNORMAL LOW (ref 135–144)
Total Bilirubin: 0.5 mg/dL (ref 0.2–0.7)
Total Protein: 6.5 g/dL (ref 6.3–8.0)

## 2018-06-14 LAB — CBC WITH AUTO DIFFERENTIAL
Bands Relative: 4 %
Basophils %: 2 %
Basophils Absolute: 0 10*3/uL (ref 0.0–0.2)
Eosinophils %: 7 %
Eosinophils Absolute: 0.1 10*3/uL (ref 0.0–0.7)
Hematocrit: 24.9 % — ABNORMAL LOW (ref 42.0–52.0)
Hemoglobin: 7.8 g/dL — ABNORMAL LOW (ref 14.0–18.0)
Lymphocytes %: 26 %
Lymphocytes Absolute: 0.5 10*3/uL — ABNORMAL LOW (ref 1.0–4.8)
MCH: 21.9 pg — ABNORMAL LOW (ref 27.0–31.3)
MCHC: 31.2 % — ABNORMAL LOW (ref 33.0–37.0)
MCV: 70.3 fL — ABNORMAL LOW (ref 80.0–100.0)
Monocytes %: 14.1 %
Monocytes Absolute: 0.3 10*3/uL (ref 0.2–0.8)
Neutrophils %: 47 %
Neutrophils Absolute: 1.1 10*3/uL — ABNORMAL LOW (ref 1.4–6.5)
PLATELET SLIDE REVIEW: ADEQUATE
Platelets: 142 10*3/uL (ref 130–400)
RBC: 3.54 M/uL — ABNORMAL LOW (ref 4.70–6.10)
RDW: 23.3 % — ABNORMAL HIGH (ref 11.5–14.5)
WBC: 2.1 10*3/uL — ABNORMAL LOW (ref 4.8–10.8)

## 2018-06-14 LAB — SODIUM, URINE, RANDOM: Sodium, Ur: <20 mEq/L

## 2018-06-14 LAB — OSMOLALITY, URINE: Osmolality, Ur: 461 mOsm/kg (ref 300–900)

## 2018-06-14 LAB — CULTURE, BLOOD 2: Culture, Blood 2: NO GROWTH

## 2018-06-14 LAB — CULTURE, BLOOD 1: Blood Culture, Routine: NO GROWTH

## 2018-06-14 LAB — CREATININE, RANDOM URINE: Creatinine, Ur: 104.2 mg/dL

## 2018-06-14 LAB — HIV-1,2 COMBO AG/AB BY CIA, REFLEXIVE PANEL: HIV 1,2 Combo Antigen/Antibody: NEGATIVE

## 2018-06-14 MED ORDER — SPIRONOLACTONE 50 MG PO TABS
50 MG | Freq: Every day | ORAL | Status: DC
Start: 2018-06-14 — End: 2018-06-16
  Administered 2018-06-14 – 2018-06-16 (×3): 100 mg via ORAL

## 2018-06-14 MED ORDER — FUROSEMIDE 40 MG PO TABS
40 MG | Freq: Every day | ORAL | Status: DC
Start: 2018-06-14 — End: 2018-06-16
  Administered 2018-06-14 – 2018-06-16 (×3): 40 mg via ORAL

## 2018-06-14 MED FILL — METHOCARBAMOL 750 MG PO TABS: 750 mg | ORAL | Qty: 1

## 2018-06-14 MED FILL — OXYCODONE HCL 5 MG PO TABS: 5 mg | ORAL | Qty: 1

## 2018-06-14 MED FILL — VANCOMYCIN HCL 1 G IV SOLR: 1 g | INTRAVENOUS | Qty: 1000

## 2018-06-14 MED FILL — LIDOCAINE PAIN RELIEF 4 % EX PTCH: 4 % | CUTANEOUS | Qty: 3

## 2018-06-14 MED FILL — GABAPENTIN 100 MG PO CAPS: 100 mg | ORAL | Qty: 1

## 2018-06-14 MED FILL — HYDROMORPHONE HCL 1 MG/ML IJ SOLN: 1 mg/mL | INTRAMUSCULAR | Qty: 1

## 2018-06-14 MED FILL — FERROUS SULFATE 325 (65 FE) MG PO TABS: 325 (65 Fe) MG | ORAL | Qty: 1

## 2018-06-14 MED FILL — SPIRONOLACTONE 50 MG PO TABS: 50 mg | ORAL | Qty: 2

## 2018-06-14 MED FILL — LACTULOSE 10 GM/15ML PO SOLN: 10 GM/15ML | ORAL | Qty: 30

## 2018-06-14 MED FILL — MELATONIN 1 MG PO TABS: 1 mg | ORAL | Qty: 1

## 2018-06-14 MED FILL — NICOTINE 21 MG/24HR TD PT24: 21 mg/(24.h) | TRANSDERMAL | Qty: 1

## 2018-06-14 MED FILL — PANTOPRAZOLE SODIUM 40 MG PO TBEC: 40 mg | ORAL | Qty: 1

## 2018-06-14 MED FILL — FUROSEMIDE 40 MG PO TABS: 40 mg | ORAL | Qty: 1

## 2018-06-14 NOTE — Progress Notes (Signed)
Renal Progress Note    Assessment and Plan:      48 y.o. year old male with history s/f HBV/HCV cirrhosis c/b portal HTN, ascites who was admitted on 06/07/18 for abdominal pain and scrotal swelling in setting of decompensated cirrhosis. Course c/b recurrent ascites requiring paracenteses (02/24 and 02/27, 9 and 5.5 L respectively, no SBP), MSSA bacteremia c/b C5-C6 OM/discites on abx, RUE/RLE weakness and sensory loss in setting of disc protrusions w/ mass effect and now AKI for which nephrology has been consulted.  ??  1. AKI: most likely 2/2 hypotension in setting of paracenteses and diuretics nml renal function at baseline   2. Decompensated liver cirrhosis (HBV/HCV)  3. Hyponatremia: likely hypervolemic hyponatremia in setting of liver disease, stable in the high 120s/low 130s  4. MSSA C5,6 OM/discitis  ??  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  Got albumin and diuretics.  On abx.  No complaints.  Says he has been requiring frequent paracentesis.        Medications:   Scheduled Meds:  ??? methocarbamol  750 mg Oral TID   ??? vancomycin  1,000 mg Intravenous Q12H   ??? sodium chloride flush  10 mL Intravenous 2 times per day   ??? gabapentin  100 mg Oral TID   ??? [Held by provider] furosemide  40 mg Oral Daily   ??? ferrous sulfate  325 mg Oral  Daily with breakfast   ??? melatonin  10 mg Oral Daily   ??? [Held by provider] spironolactone  100 mg Oral Daily   ??? pantoprazole  40 mg Oral QAM AC   ??? lidocaine  3 patch Transdermal Daily   ??? vancomycin (VANCOCIN) intermittent dosing Leisure centre manager)   Other RX Placeholder   ??? sodium chloride flush  10 mL Intravenous 2 times per day   ??? [Held by provider] enoxaparin  40 mg Subcutaneous Daily   ??? nicotine  1 patch Transdermal Daily   ??? lactulose  20 g Oral TID   ??? tenofovir  300 mg Oral Daily     Continuous Infusions:    CBC:   Recent Labs     06/13/18  0542 06/14/18  0600   WBC 2.7* 2.1*   HGB 7.3* 7.8*   PLT 93* 142     CMP:    Recent Labs     06/12/18  0600 06/13/18  0545 06/14/18  0600   NA 130* 132* 132*   K 4.4 5.0* 4.1   CL 97 100 99   CO2 21 22 21    BUN 22* 22* 23*   CREATININE 1.32*  1.35* 1.19   GLUCOSE 187* 98 191*   CALCIUM 8.0* 8.2* 8.3*   LABGLOM 58.5* 57.0* >60.0     Troponin: No results for input(s): TROPONINI in the last 72 hours.  BNP: No results for input(s): BNP in the last 72 hours.  INR: No results for input(s): INR in the last 72 hours.  Lipids: No results for input(s): CHOL, LDLDIRECT, TRIG, HDL, AMYLASE, LIPASE in the last 72 hours.  Liver:   Recent Labs     06/14/18  0600   AST 60*   ALT 28   ALKPHOS 134*   PROT 6.5   LABALBU 2.9*   BILITOT 0.5     Iron:  No results for input(s): IRONS, FERRITIN in the last 72 hours.    Invalid input(s): LABIRONS  Urinalysis: No results for input(s): UA in the last 72 hours.    Objective:   Vitals: BP (!) 102/59    Pulse 63    Temp 98.1 ??F (36.7 ??C) (Oral)    Resp 17    Ht 6\' 1"  (1.854 m)    Wt 215 lb 14.4 oz (97.9 kg)    SpO2 100%    BMI 28.48 kg/m??    Wt Readings from Last 3 Encounters:   06/14/18 215 lb 14.4 oz (97.9 kg)   05/27/18 220 lb (99.8 kg)   07/09/16 211 lb (95.7 kg)      24HR INTAKE/OUTPUT:      Intake/Output Summary (Last 24 hours) at 06/14/2018 1022  Last data filed at 06/14/2018 0837  Gross per 24 hour   Intake 1710 ml   Output 900 ml   Net 810 ml        Constitutional:  Alert, awake, no apparent distress   Skin:normal, no rash  HEENT:sclera anicteric.  Head atraumatic normocephalic  Neck:supple with no thyromegally  Cardiovascular:  S1, S2 without m/r/g   Respiratory:  CTA B without w/r/r   Abdomen: +bs, soft, nt.  Slight distention  Ext: trace LE edema  Musculoskeletal:Intact  Neuro:Alert and oriented with no deficit      Electronically signed by Synetta Shadow, MD on 06/14/2018 at 10:22 AM

## 2018-06-14 NOTE — Care Coordination-Inpatient (Signed)
Noted plan for discharge tomorrow or Monday if stable. Continues to have pain.

## 2018-06-14 NOTE — Progress Notes (Signed)
Physical Therapy Missed Treatment   Facility/Department: [facility] [id number]    NAME: Travis Ortega  Patient Status:   DOB: Jun 21, 1970 (48 y.o.)  MRN: [id number]  Account: [id number]  Gender: male        [x]  Patient Declines PT Treatment            []  Patient Unavailable:     Pt states he is having pain and is not going to get up and walk until later on. Pt has been ambulating ad lib when he is able to. Pt reports he might be dc tomorrow.   Will attempt PT Treatment again at earliest convenience.        Electronically signed by Toy Cookey, PTA on 06/14/18 at 8:56 AM

## 2018-06-14 NOTE — Progress Notes (Signed)
Pt assessment complete. Pt alert and oriented. Medicated for pain as ordered. States he is having pain in neck and back. Lidocaine patches placed. Denies nausea, shortness of breath. Up independently. Took shower. Normal sinus rhythm on tele.

## 2018-06-14 NOTE — Progress Notes (Signed)
[facility] Daily Progress Note  Name: Travis Ortega  Age: 48 y.o.  Gender: male  CodeStatus: Full Code  Allergies: Nubain [Nalbuphine]  Morphine  Penicillin G  Tylenol [Acetaminophen]    Chief Complaint:Groin Pain (neck and back pain, emisis )    Primary Care Provider: Yaakov Guthrie, APRN - CNP  InpatientTreatment Team: Treatment Team: Attending Provider: Lowry Bowl, DO; Consulting Physician: Thamas Jaegers, MD; Consulting Physician: Mirian Capuchin, MD; Consulting Physician: Eudelia Bunch, MD; Utilization Reviewer: Marsa Aris, RN; Consulting Physician: Earna Coder, MD; Consulting Physician: Windy Fast, MD; Consulting Physician: Wynelle Byram, MD; Utilization Reviewer: Tacey Heap, RN; Nursing Student: Tyna Jaksch; Registered Nurse: Meryl Crutch, RN; Case Manager: Doylene Canard, RN; Nursing Student: Bethann Punches; Social Worker: Nathaneil Canary, LSW; Patient Care Tech: Jonni Sanger; Consulting Physician: Synetta Shadow, MD  Admission Date: 06/07/2018      HPI   Pt seen and examined for neuro follow up for right sided weakness. Currently A&O x3, NAD, cooperative. Still with right sided weakness upper > lower. Pt with c/o pain down right arm that radiates from neck/shoulder and tingling to right hand. Also with limited ROM, up to about  90 degrees than becomes too painful.   NO Headache, double vision, blurry vision, difficulty with speech, difficulty with swallowing, weakness, numbness, pain, nausea, vomiting, choking, neck pain, dizziness  BP (!) 102/59   Pulse 63   Temp 98.1 F (36.7 C) (Oral)   Resp 17   Ht 6\' 1"  (1.854 m)   Wt 215 lb 14.4 oz (97.9 kg)   SpO2 100%   BMI 28.48 kg/m                                 Review of Systems   Constitutional: Positive for fatigue. Negative for appetite change, chills and fever.   HENT: Negative for hearing loss and trouble swallowing.    Eyes: Negative for visual disturbance.   Respiratory: Negative for cough,  chest tightness, shortness of breath and wheezing.    Cardiovascular: Positive for leg swelling. Negative for chest pain and palpitations.   Gastrointestinal: Positive for abdominal distention. Negative for abdominal pain, constipation, diarrhea, nausea and vomiting.   Musculoskeletal: Positive for gait problem.   Skin: Negative for color change and rash.   Neurological: Positive for weakness. Negative for dizziness, tremors, seizures, syncope, facial asymmetry, speech difficulty, light-headedness, numbness and headaches.   Psychiatric/Behavioral: Negative for agitation, confusion and hallucinations. The patient is not nervous/anxious.      Physical Exam  Vitals signs and nursing note reviewed.   Constitutional:       General: He is not in acute distress.     Appearance: He is not diaphoretic.   HENT:      Head: Normocephalic and atraumatic.   Eyes:      General: No visual field deficit.     Extraocular Movements: Extraocular movements intact.      Pupils: Pupils are equal, round, and reactive to light.   Cardiovascular:      Rate and Rhythm: Normal rate and regular rhythm.   Pulmonary:      Effort: Pulmonary effort is normal. No respiratory distress.      Breath sounds: Normal breath sounds.   Abdominal:      General: Bowel sounds are normal. There is distension.      Palpations: Abdomen is soft.   Skin:  General: Skin is warm and dry.   Neurological:      Mental Status: He is alert and oriented to person, place, and time.      Cranial Nerves: No cranial nerve deficit, dysarthria or facial asymmetry.      Motor: Weakness present. No tremor or seizure activity.      Coordination: Coordination abnormal (RUE).      Gait: Gait abnormal.      Deep Tendon Reflexes: Reflexes abnormal.       Medications:  Reviewed    Infusion Medications:   Scheduled Medications:   . methocarbamol  750 mg Oral TID   . vancomycin  1,000 mg Intravenous Q12H   . sodium chloride flush  10 mL Intravenous 2 times per day   . gabapentin  100  mg Oral TID   . [Held by provider] furosemide  40 mg Oral Daily   . ferrous sulfate  325 mg Oral Daily with breakfast   . melatonin  10 mg Oral Daily   . [Held by provider] spironolactone  100 mg Oral Daily   . pantoprazole  40 mg Oral QAM AC   . lidocaine  3 patch Transdermal Daily   . vancomycin (VANCOCIN) intermittent dosing Leisure centre manager)   Other Control and instrumentation engineer   . sodium chloride flush  10 mL Intravenous 2 times per day   . [Held by provider] enoxaparin  40 mg Subcutaneous Daily   . nicotine  1 patch Transdermal Daily   . lactulose  20 g Oral TID   . tenofovir  300 mg Oral Daily     PRN Meds: sodium chloride flush, oxyCODONE, HYDROmorphone **OR** [DISCONTINUED] HYDROmorphone, hydrOXYzine, sodium chloride flush, polyethylene glycol, promethazine, ondansetron    Labs:   Recent Labs     06/11/18  1412 06/12/18  0600 06/13/18  0542   WBC 2.8* 2.6* 2.7*   HGB 7.7* 6.9* 7.3*   HCT 24.3* 22.0* 23.0*   PLT 141 120* 93*     Recent Labs     06/11/18  1412 06/12/18  0600 06/13/18  0545   NA 132* 130* 132*   K 4.8 4.4 5.0*   CL 99 97 100   CO2 22 21 22    BUN 19 22* 22*   CREATININE 1.07 1.32* 1.35*   CALCIUM 8.2* 8.0* 8.2*     Recent Labs     06/11/18  1412 06/12/18  0600 06/13/18  0545   AST 48* 49* 51*   ALT 27 26 26    BILITOT 0.6 0.6 0.6   ALKPHOS 147* 130* 132*     No results for input(s): INR in the last 72 hours.  No results for input(s): CKTOTAL, TROPONINI in the last 72 hours.    Urinalysis:   Lab Results   Component Value Date    NITRU Negative 06/10/2018    WBCUA 0-2 07/10/2015    BACTERIA None 07/10/2015    RBCUA 3-5 07/10/2015    BLOODU Negative 06/10/2018    SPECGRAV 1.016 06/10/2018    GLUCOSEU Negative 06/10/2018       Radiology:   Most recent    EEG No procedure found.    MRI of Brain   Results for orders placed during the hospital encounter of 06/07/18   MRI BRAIN W WO CONTRAST    Narrative MRI BRAIN W WO CONTRAST : 06/12/2018 1:15 PM    CLINICAL HISTORY:  right sided weakness in the setting of OM, r/o  abscess .  COMPARISON: Cervical, thoracic and lumbar spine MRIs 06/10/2018.    TECHNIQUE: Multiplanar MR imaging of the head was performed before and after intravenous administration of approximately 15 mL of ProHance gadolinium contrast.      FINDINGS:     Motion artifact limits some sequences.    There is no infarct, intracranial hemorrhage, mass effect, midline shift, extra-axial collection, hydrocephalus, or abnormal enhancement.      Minimal to mild nonspecific predominantly subcortical supratentorial white matter changes are most likely chronic small vessel ischemic disease.          Impression NO ACUTE INTRACRANIAL PROCESS IDENTIFIED.             No results found for this or any previous visit.                          MRA of the Head and Neck: No results found for this or any previous visit.No results found for this or any previous visit.No results found for this or any previous visit.                          CT of the Head: No results found for this or any previous visit.No results found for this or any previous visit.No results found for this or any previous visit.    Carotid duplex: No results found for this or any previous visit.No results found for this or any previous visit.No results found for this or any previous visit.    Echo No results found for this or any previous visit.          Assessment/Plan:    Right sided weakness  Reflexes are normal  No focal findings  Mild cervical myelopathy  Sepsis due to MSSA bactremia complicated by osteomyelitis/discits C5-6  Decompensated liver cirrhosis  Pancytopenia  MRI cervical, thoracic , lumbar spine reviewed by Dr Allena Katz. No concerning spinal cord compression noted.   MRI brain no acute findings  Check sed rate, CRP, procalcitonin to conclude a diagnosis of osteomyelitis  Will follow  MRI findings are very subtle for OM, sed rate, CRP, procalcitonin mildly elevated, ID following  May need EMG/further imaging as OP for right arm weakness    Joniah Bednarski R. Allena Katz,  MD, FAHA  Diplomate, American Board of Psychiatry & Neurology  Board Certified in Vascular Neurology  Board Certified in Neuromuscular Medicine  Certified in Neurorehabilitation

## 2018-06-14 NOTE — Plan of Care (Signed)
Problem: Pain:  Goal: Pain level will decrease  Description  Pain level will decrease  Outcome: Ongoing  Goal: Control of acute pain  Description  Control of acute pain  Outcome: Ongoing  Goal: Control of chronic pain  Description  Control of chronic pain  Outcome: Ongoing     Problem: Falls - Risk of:  Goal: Will remain free from falls  Description  Will remain free from falls  Outcome: Ongoing  Goal: Absence of physical injury  Description  Absence of physical injury  Outcome: Ongoing     Problem: Activity:  Goal: Fatigue will decrease  Description  Fatigue will decrease  Outcome: Ongoing  Goal: Ability to tolerate increased activity will improve  Description  Ability to tolerate increased activity will improve  Outcome: Ongoing  Goal: Ability to maintain optimal joint mobility will improve  Description  Ability to maintain optimal joint mobility will improve  Outcome: Ongoing     Problem: Bowel/Gastric:  Goal: Ability to achieve a regular elimination pattern will improve  Description  Ability to achieve a regular elimination pattern will improve  Outcome: Ongoing     Problem: Cardiac:  Goal: Ability to maintain an adequate cardiac output will improve  Description  Ability to maintain an adequate cardiac output will improve  Outcome: Ongoing  Goal: Ability to maintain adequate ventilation will improve  Description  Ability to maintain adequate ventilation will improve  Outcome: Ongoing  Goal: Ability to achieve and maintain adequate cardiopulmonary perfusion will improve  Description  Ability to achieve and maintain adequate cardiopulmonary perfusion will improve  Outcome: Ongoing     Problem: Mobility - Impaired:  Goal: Mobility will improve  Description  Therapy evaluation completed.  Please see daily notes and/or progress notes for details related to planned treatment interventions, goals and functional abilities.     Outcome: Ongoing     Problem: IP BALANCE  Goal: LTG - patient will maintain standing balance  to allow for completion of daily activities  Outcome: Ongoing

## 2018-06-14 NOTE — Progress Notes (Signed)
Nutrition Assessment (Low Risk)    Type and Reason for Visit: Initial, RD Nutrition Re-Screen(LOS Day 7)    Nutrition Recommendations: Continue current diet(2 gm Sodium)     Nutrition Assessment:  Patient assessed for nutritional risk.  Deemed to be at low risk at this time.  Will continue to monitor for changes in status. Pt admitted with cirrhosis related to ETOH abuse with complaint of poor intake x2 days pta. Intake has improved to 100% of meals with medical treatment. Will continue to monitor adequacy of intake.    Malnutrition Assessment:  ?? Malnutrition Status: No malnutrition    Nutrition Risk Level  ??? Risk Level: Low    Nutrition Diagnosis:   ?? Problem: Inadequate oral intake((improved))  ?? Etiology: (acute illness)   ??? Signs and symptoms: Diet history of poor intake    Nutrition Intervention:  Food and/or Delivery: Continue current diet(2 gm Sodium)  Nutrition Education/Counseling/Coordination of Care:  Continued Inpatient Monitoring, Education not appropriate at this time      Electronically signed by Lovie Chol, RD, LD on 06/14/18 at 12:55 PM

## 2018-06-14 NOTE — Progress Notes (Signed)
Infectious Disease Progress Note       06/14/2018    Patient is a hospital followup regarding MSSA bacteremia with Flu like sxs and decompensated liver cirrhosis.   S/p paracentesis   IV Vanco ( PCN allergy )  Blood Cx + MSSA.  MRI C spine with possible C5 discitis.     Subjectively, complains of generalized pain, nausea, and lack of sleep overnight.     General: Patient in no acute distress, cooperative  Skin: no new rashes   HEENT: EOMI, MMM, Neck is supple  Heart: S1 S2  Lungs:bilaterally clear  Abdomen: soft, ND, mild tenderness diffusely to palpation  Extrem:+pulses, no calf pain  Neuro exam: CN II-XII intact      Lab Results   Component Value Date    WBC 2.1 (L) 06/14/2018    HGB 7.8 (L) 06/14/2018    HCT 24.9 (L) 06/14/2018    MCV 70.3 (L) 06/14/2018    PLT 142 06/14/2018     Lab Results   Component Value Date    NA 132 06/14/2018    K 4.1 06/14/2018    CL 99 06/14/2018    CO2 21 06/14/2018    BUN 23 06/14/2018    CREATININE 1.19 06/14/2018    GLUCOSE 191 06/14/2018    GLUCOSE 102 05/31/2016    CALCIUM 8.3 06/14/2018        WBC trends are being monitored. Antibiotic doses are being adjusted per most recent renal labs.     Vitals:    06/14/18 0750   BP: (!) 102/59   Pulse: 63   Resp: 17   Temp: 98.1 ??F (36.7 ??C)   SpO2: 100%           Patient Active Problem List   Diagnosis   ??? Recurrent incisional hernia   ??? Cirrhosis (HCC)   ??? Abdominal pain   ??? Ascites of liver   ??? Visceral hyperalgesia   ??? Generalized muscle ache   ??? MSSA bacteremia   ??? Osteomyelitis of cervical vertebra (HCC)   ??? Cervicalgia   ??? 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 Displacement of thoracic intervertebral disc without myelopathy   ??? Degeneration of thoracic intervertebral disc   ??? Degeneration of lumbar intervertebral disc   ??? Muscle weakness of upper extremity           ASSESSMENT:  ?? 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 AKI  ?? Decompensated  liver cirrhosis with reaccumulating fluid  ?? Acute bronchitis, Flu like illness, no pneumonia  ??  PLAN:  ?? SNF for 6 weeks IV Vanco- may possibly extend to 8 weeks depending on patient's clinical picture on follow up.   ?? PO antibiotics for 6 weeks afterward for discitis  ?? F/U Vanco trough CBC BMP CRP weekly  ?? F/U with Hepatology    FU with ID in 3 weeks.   Med rec done    Imaging and labs were reviewed per medical records and any ID pertinent labs were addressed with the patient.       Jennalyn Cawley P Mila Pair DO

## 2018-06-14 NOTE — Progress Notes (Signed)
Physician Progress Note    06/14/2018   3:39 PM    Name:  Travis Ortega  MRN:    [id number]     IP Day: 7     Admit Date: 06/07/2018  4:23 PM  PCP: Yaakov Guthrie, APRN - CNP    Code Status:  Full Code    Subjective:     Unchanged symptoms-still with significant neck pain and right upper extremity numbness and weakness.  Still with significant abdominal pain especially at site of paracentesis.  No nausea, chest pain, dyspnea.  Moving bowels.    Current Facility-Administered Medications   Medication Dose Route Frequency Provider Last Rate Last Dose   ??? furosemide (LASIX) tablet 40 mg  40 mg Oral Daily Synetta Shadow, MD   40 mg at 06/14/18 1036   ??? spironolactone (ALDACTONE) tablet 100 mg  100 mg Oral Daily Synetta Shadow, MD   100 mg at 06/14/18 1036   ??? methocarbamol (ROBAXIN) tablet 750 mg  750 mg Oral TID Colon Flattery, MD   750 mg at 06/14/18 1315   ??? vancomycin 1000 mg IVPB in 250 mL D5W addavial  1,000 mg Intravenous Q12H Eudelia Bunch, MD   Stopped at 06/14/18 0620   ??? sodium chloride flush 0.9 % injection 10 mL  10 mL Intravenous 2 times per day Eudelia Bunch, MD   10 mL at 06/14/18 0906   ??? sodium chloride flush 0.9 % injection 10 mL  10 mL Intravenous PRN Glenis Smoker Abbud, MD   10 mL at 06/13/18 0313   ??? oxyCODONE (ROXICODONE) immediate release tablet 5 mg  5 mg Oral Q4H PRN Earna Coder, MD   5 mg at 06/14/18 1315   ??? HYDROmorphone (DILAUDID) injection 1 mg  1 mg Intravenous Q4H PRN Earna Coder, MD   1 mg at 06/14/18 1434   ??? gabapentin (NEURONTIN) capsule 100 mg  100 mg Oral TID Earna Coder, MD   100 mg at 06/14/18 1315   ??? ferrous sulfate tablet 325 mg  325 mg Oral Daily with breakfast Lowry Bowl, DO   325 mg at 06/14/18 1537   ??? melatonin tablet 10 mg  10 mg Oral Daily Lowry Bowl, DO   10 mg at 06/13/18 2000   ??? hydrOXYzine (ATARAX) tablet 25 mg  25 mg Oral Q6H PRN Lowry Bowl, DO   25 mg at 06/10/18 1325   ??? pantoprazole (PROTONIX) tablet 40 mg  40 mg Oral QAM AC Yazid R Hussein, DO    40 mg at 06/14/18 0510   ??? lidocaine 4 % external patch 3 patch  3 patch Transdermal Daily Earna Coder, MD   3 patch at 06/14/18 9432   ??? vancomycin (VANCOCIN) intermittent dosing [employer])   Other RX Placeholder Rita A Abbud, MD       ??? sodium chloride flush 0.9 % injection 10 mL  10 mL Intravenous 2 times per day Rose Fillers, APRN - CNP   10 mL at 06/14/18 0908   ??? sodium chloride flush 0.9 % injection 10 mL  10 mL Intravenous PRN Rose Fillers, APRN - CNP       ??? polyethylene glycol (GLYCOLAX) packet 17 g  17 g Oral Daily PRN Rose Fillers, APRN - CNP       ??? promethazine (PHENERGAN) tablet 12.5 mg  12.5 mg Oral Q6H PRN Rose Fillers, APRN - CNP       ??? ondansetron [facility]) injection  4 mg  4 mg Intravenous Q6H PRN Rose Fillers, APRN - CNP   4 mg at 06/13/18 1711   ??? [Held by provider] enoxaparin (LOVENOX) injection 40 mg  40 mg Subcutaneous Daily Rose Fillers, APRN - CNP   Stopped at 06/11/18 2100   ??? nicotine (NICODERM CQ) 21 MG/24HR 1 patch  1 patch Transdermal Daily Rose Fillers, APRN - CNP   1 patch at 06/14/18 6948   ??? lactulose (CHRONULAC) 10 GM/15ML solution 20 g  20 g Oral TID Rose Fillers, APRN - CNP   20 g at 06/14/18 1316   ??? tenofovir (VIREAD) tablet 300 mg  300 mg Oral Daily Rose Fillers, APRN - CNP           Physical Examination:      Vitals:  BP (!) 102/59    Pulse 63    Temp 98.1 ??F (36.7 ??C) (Oral)    Resp 17    Ht 6\' 1"  (1.854 m)    Wt 215 lb 14.4 oz (97.9 kg)    SpO2 100%    BMI 28.48 kg/m??   Temp (24hrs), Avg:98.3 ??F (36.8 ??C), Min:98.1 ??F (36.7 ??C), Max:98.6 ??F (37 ??C)    General appearance: alert, cooperative and no distress  Mental Status: oriented to person, place and time and normal affect  Back: Point tenderness in C-spine and and T-spine.  Lungs: clear to auscultation bilaterally, normal effort  Heart: regular rate and rhythm, no murmur  Abdomen: Diffuse tenderness to palpation with guarding and mild rebound tenderness.  Soft.  Bowel sounds present.  Extremities: Trace pitting edema  bilaterally  Skin: no gross lesions, rashes  Neuro: Mild asterixis.  Weakness in right upper extremity and right lower extremity.    Data:     Labs:  Recent Labs     06/13/18  0542 06/14/18  0600   WBC 2.7* 2.1*   HGB 7.3* 7.8*   PLT 93* 142     Recent Labs     06/13/18  0545 06/14/18  0600   NA 132* 132*   K 5.0* 4.1   CL 100 99   CO2 22 21   BUN 22* 23*   CREATININE 1.35* 1.19   GLUCOSE 98 191*     Recent Labs     06/13/18  0545 06/14/18  0600   AST 51* 60*   ALT 26 28   BILITOT 0.6 0.5   ALKPHOS 132* 134*     MRI of the cervical spine:   1. Limited examination secondary to motion artifact and lack of IV contrast administration.   2. There is STIR hyperintensity noted within the vertebral bodies of C5 and C6 with asymmetrically increased hyperintensity within the C5-C6 intervertebral disc space, findings are suspicious for osteomyelitis/discitis the C5-C6 intervertebral disc and    the C5 and C6 vertebral bodies.   3. Degenerative changes noted C3-C7, with neural foraminal narrowing ranging from moderate to moderately severe. Moderate spinal canal stenosis is noted at C6-C7. Please see above for level by level details.   ??   MRI of the thoracic spine:   1. Limited examination secondary to motion artifact and lack of IV contrast administration.   2. There is no evidence of osteomyelitis, discitis or abscess within the MRI thoracic spine exam, given limitations imposed on interpretation and evaluation by lack of IV contrast administration.   3. Small T5-T6 right paracentral disc protrusion without frank spinal canal stenosis.   4. Central T6-T7 disc protrusion  measuring approximately 0.6 cm anterior posterior by 0.56 cm transverse, with mild mass effect on the ventral thoracic spinal cord.   5. Moderate circumferential disc bulge at T11-T12. See above for other level by level evaluation.   ??   MRI of the lumbar spine:   1. Limited examination secondary to motion artifact and lack of IV contrast administration.   2.  No evidence of osteomyelitis, discitis or abscess within the MRI lumbar spine exam, given limitations imposed on interpretation and evaluation by lack of IV contrast administration.   3. Multilevel degenerative disc disease and facet osteoarthropathy L1-S1, with possible abutment of the exiting right L5 spinal nerve root at L5-S1 level, by the facet osteoarthropathy. No spinal canal stenosis identified. Multilevel neural foraminal    narrowing noted ranging from mild to moderate to moderately severe, see above for level by level details.   4. Joint effusion at the left L4-L5 facet articulation.         Assessment and Plan:        48 year old male with cirrhosis (Hep B/C, and h/o heavy etOH use) presented with worsening abdominal pain and scrotal swelling.    1.  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 after treatment of infection complete  -Pain management following    2. 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 No obvious vegetation on TTE. Repeat BCx clear  -6 weeks IV vancomycin    3.  Iron deficiency anemia / Pancytopenia: Continue PPI.  Supplement iron. 1u pRBC given this admission    4. RUE and RLE weakness and sensory loss in setting of C5-6 OM, T5-7 disc protrusion with mild mass effect, and L5-S1 narrowing. MRI head negative  - continue PT/OT, pain control  -Appreciate neurology assistance    5. AKI suspect hemodynamically mediated from diuretics and paracenteses: Improving.  Additional albumin given yesterday.  Appreciate nephrology assistance    Diet: DIET GENERAL; Low Sodium (2 GM)  Ppx: lovenox  Full Code    Dispo: [facility] when medically stable-tomorrow or Monday    Electronically signed by Lowry Bowl, DO on 06/14/2018 at 3:39  PM

## 2018-06-15 LAB — CBC WITH AUTO DIFFERENTIAL
Basophils %: 2 %
Basophils Absolute: 0 10*3/uL (ref 0.0–0.2)
Eosinophils %: 3.8 %
Eosinophils Absolute: 0.1 10*3/uL (ref 0.0–0.7)
Hematocrit: 24.8 % — ABNORMAL LOW (ref 42.0–52.0)
Hemoglobin: 7.9 g/dL — ABNORMAL LOW (ref 14.0–18.0)
Lymphocytes %: 23.2 %
Lymphocytes Absolute: 0.5 10*3/uL — ABNORMAL LOW (ref 1.0–4.8)
MCH: 22.2 pg — ABNORMAL LOW (ref 27.0–31.3)
MCHC: 31.9 % — ABNORMAL LOW (ref 33.0–37.0)
MCV: 69.6 fL — ABNORMAL LOW (ref 80.0–100.0)
Monocytes %: 20.7 %
Monocytes Absolute: 0.5 10*3/uL (ref 0.2–0.8)
Neutrophils %: 50.3 %
Neutrophils Absolute: 1.2 10*3/uL — ABNORMAL LOW (ref 1.4–6.5)
Platelets: 139 10*3/uL (ref 130–400)
RBC: 3.56 M/uL — ABNORMAL LOW (ref 4.70–6.10)
RDW: 23.2 % — ABNORMAL HIGH (ref 11.5–14.5)
WBC: 2.3 10*3/uL — ABNORMAL LOW (ref 4.8–10.8)

## 2018-06-15 LAB — COMPREHENSIVE METABOLIC PANEL W/ REFLEX TO MG FOR LOW K
ALT: 32 U/L (ref 0–41)
AST: 68 U/L — ABNORMAL HIGH (ref 0–40)
Albumin: 2.8 g/dL — ABNORMAL LOW (ref 3.5–4.6)
Alkaline Phosphatase: 136 U/L — ABNORMAL HIGH (ref 35–104)
Anion Gap: 11 mEq/L (ref 9–15)
BUN: 26 mg/dL — ABNORMAL HIGH (ref 6–20)
CO2: 23 mEq/L (ref 20–31)
Calcium: 8.3 mg/dL — ABNORMAL LOW (ref 8.5–9.9)
Chloride: 99 mEq/L (ref 95–107)
Creatinine: 1.16 mg/dL (ref 0.70–1.20)
GFR African American: >60 (ref >60)
GFR Non-African American: >60 (ref >60)
Globulin: 3.5 g/dL (ref 2.3–3.5)
Glucose: 113 mg/dL — ABNORMAL HIGH (ref 70–99)
Potassium reflex Magnesium: 4.4 mEq/L (ref 3.4–4.9)
Sodium: 133 mEq/L — ABNORMAL LOW (ref 135–144)
Total Bilirubin: 0.5 mg/dL (ref 0.2–0.7)
Total Protein: 6.3 g/dL (ref 6.3–8.0)

## 2018-06-15 LAB — VANCOMYCIN LEVEL, TROUGH: Vancomycin Tr: 14.5 ug/mL (ref 10.0–20.0)

## 2018-06-15 MED ORDER — FUROSEMIDE 40 MG PO TABS
40 MG | ORAL_TABLET | Freq: Every day | ORAL | 3 refills | Status: AC
Start: 2018-06-15 — End: ?

## 2018-06-15 MED ORDER — METHOCARBAMOL 750 MG PO TABS
750 MG | ORAL_TABLET | Freq: Three times a day (TID) | ORAL | 0 refills | Status: AC
Start: 2018-06-15 — End: 2018-06-25

## 2018-06-15 MED ORDER — FERROUS SULFATE 325 (65 FE) MG PO TABS
325 (65 Fe) MG | ORAL_TABLET | Freq: Every day | ORAL | 3 refills | Status: AC
Start: 2018-06-15 — End: ?

## 2018-06-15 MED ORDER — OXYCODONE HCL 5 MG PO TABS
5 MG | ORAL_TABLET | Freq: Four times a day (QID) | ORAL | 0 refills | Status: AC | PRN
Start: 2018-06-15 — End: 2018-06-18

## 2018-06-15 MED ORDER — OMEPRAZOLE 20 MG PO CPDR
20 MG | ORAL_CAPSULE | Freq: Every day | ORAL | 3 refills | Status: AC
Start: 2018-06-15 — End: ?

## 2018-06-15 MED ORDER — LIDOCAINE 4 % EX PTCH
4 % | MEDICATED_PATCH | Freq: Every day | CUTANEOUS | 0 refills | Status: AC
Start: 2018-06-15 — End: ?

## 2018-06-15 MED FILL — NICOTINE 21 MG/24HR TD PT24: 21 mg/(24.h) | TRANSDERMAL | Qty: 1

## 2018-06-15 MED FILL — HYDROMORPHONE HCL 1 MG/ML IJ SOLN: 1 mg/mL | INTRAMUSCULAR | Qty: 1

## 2018-06-15 MED FILL — VANCOMYCIN HCL 1 G IV SOLR: 1 g | INTRAVENOUS | Qty: 1000

## 2018-06-15 MED FILL — MELATONIN 1 MG PO TABS: 1 mg | ORAL | Qty: 1

## 2018-06-15 MED FILL — METHOCARBAMOL 750 MG PO TABS: 750 mg | ORAL | Qty: 1

## 2018-06-15 MED FILL — OXYCODONE HCL 5 MG PO TABS: 5 mg | ORAL | Qty: 1

## 2018-06-15 MED FILL — LIDOCAINE PAIN RELIEF 4 % EX PTCH: 4 % | CUTANEOUS | Qty: 3

## 2018-06-15 MED FILL — FERROUS SULFATE 325 (65 FE) MG PO TABS: 325 (65 Fe) MG | ORAL | Qty: 1

## 2018-06-15 MED FILL — FUROSEMIDE 40 MG PO TABS: 40 mg | ORAL | Qty: 1

## 2018-06-15 MED FILL — SPIRONOLACTONE 50 MG PO TABS: 50 mg | ORAL | Qty: 2

## 2018-06-15 MED FILL — GABAPENTIN 100 MG PO CAPS: 100 mg | ORAL | Qty: 1

## 2018-06-15 MED FILL — LACTULOSE 10 GM/15ML PO SOLN: 10 GM/15ML | ORAL | Qty: 30

## 2018-06-15 MED FILL — PANTOPRAZOLE SODIUM 40 MG PO TBEC: 40 mg | ORAL | Qty: 1

## 2018-06-15 MED FILL — ONDANSETRON HCL 4 MG/2ML IJ SOLN: 4 MG/2ML | INTRAMUSCULAR | Qty: 2

## 2018-06-15 NOTE — Progress Notes (Signed)
Per Dr. Floyde Parkins ok for patient to be discharged from his standpoint.

## 2018-06-15 NOTE — Progress Notes (Signed)
Per Everlean Patterson NP GI will not be doing anything with this patient so can be discharged per their standpoint.

## 2018-06-15 NOTE — Progress Notes (Signed)
Physician Progress Note    06/15/2018   2:25 PM    Name:  Travis Ortega  MRN:    [id number]     IP Day: 8     Admit Date: 06/07/2018  4:23 PM  PCP: Yaakov Guthrie, APRN - CNP    Code Status:  Full Code    Subjective:     Unchanged symptoms-still with significant neck pain and right upper extremity numbness and weakness.  Still with significant abdominal pain especially at site of paracentesis.  No nausea, chest pain, dyspnea.  Moving bowels.    Current Facility-Administered Medications   Medication Dose Route Frequency Provider Last Rate Last Dose   ??? furosemide (LASIX) tablet 40 mg  40 mg Oral Daily Synetta Shadow, MD   40 mg at 06/15/18 0757   ??? spironolactone (ALDACTONE) tablet 100 mg  100 mg Oral Daily Synetta Shadow, MD   100 mg at 06/15/18 0757   ??? methocarbamol (ROBAXIN) tablet 750 mg  750 mg Oral TID Colon Flattery, MD   750 mg at 06/15/18 1326   ??? vancomycin 1000 mg IVPB in 250 mL D5W addavial  1,000 mg Intravenous Q12H Eudelia Bunch, MD   Stopped at 06/15/18 6213   ??? sodium chloride flush 0.9 % injection 10 mL  10 mL Intravenous 2 times per day Eudelia Bunch, MD   10 mL at 06/15/18 1243   ??? sodium chloride flush 0.9 % injection 10 mL  10 mL Intravenous PRN Glenis Smoker Abbud, MD   10 mL at 06/13/18 0313   ??? oxyCODONE (ROXICODONE) immediate release tablet 5 mg  5 mg Oral Q4H PRN Earna Coder, MD   5 mg at 06/15/18 1326   ??? HYDROmorphone (DILAUDID) injection 1 mg  1 mg Intravenous Q4H PRN Earna Coder, MD   1 mg at 06/15/18 1211   ??? gabapentin (NEURONTIN) capsule 100 mg  100 mg Oral TID Earna Coder, MD   100 mg at 06/15/18 1326   ??? ferrous sulfate tablet 325 mg  325 mg Oral Daily with breakfast Lowry Bowl, DO   325 mg at 06/15/18 0757   ??? melatonin tablet 10 mg  10 mg Oral Daily Lowry Bowl, DO   10 mg at 06/14/18 2112   ??? hydrOXYzine (ATARAX) tablet 25 mg  25 mg Oral Q6H PRN Lowry Bowl, DO   25 mg at 06/10/18 1325   ??? pantoprazole (PROTONIX) tablet 40 mg  40 mg Oral QAM AC Yazid R Hussein, DO    40 mg at 06/15/18 0511   ??? lidocaine 4 % external patch 3 patch  3 patch Transdermal Daily Earna Coder, MD   3 patch at 06/15/18 0758   ??? vancomycin (VANCOCIN) intermittent dosing [employer])   Other RX Placeholder Rita A Abbud, MD       ??? sodium chloride flush 0.9 % injection 10 mL  10 mL Intravenous 2 times per day Rose Fillers, APRN - CNP   10 mL at 06/14/18 2116   ??? sodium chloride flush 0.9 % injection 10 mL  10 mL Intravenous PRN Rose Fillers, APRN - CNP       ??? polyethylene glycol (GLYCOLAX) packet 17 g  17 g Oral Daily PRN Rose Fillers, APRN - CNP       ??? promethazine (PHENERGAN) tablet 12.5 mg  12.5 mg Oral Q6H PRN Rose Fillers, APRN - CNP       ??? ondansetron [facility]) injection  4 mg  4 mg Intravenous Q6H PRN Rose Fillers, APRN - CNP   4 mg at 06/15/18 0250   ??? [Held by provider] enoxaparin (LOVENOX) injection 40 mg  40 mg Subcutaneous Daily Rose Fillers, APRN - CNP   Stopped at 06/11/18 2100   ??? nicotine (NICODERM CQ) 21 MG/24HR 1 patch  1 patch Transdermal Daily Rose Fillers, APRN - CNP   1 patch at 06/15/18 0758   ??? lactulose (CHRONULAC) 10 GM/15ML solution 20 g  20 g Oral TID Rose Fillers, APRN - CNP   20 g at 06/15/18 0757   ??? tenofovir (VIREAD) tablet 300 mg  300 mg Oral Daily Rose Fillers, APRN - CNP           Physical Examination:      Vitals:  BP (!) 98/52    Pulse 64    Temp 98.1 ??F (36.7 ??C) (Oral)    Resp 17    Ht 6\' 1"  (1.854 m)    Wt 215 lb 14.4 oz (97.9 kg)    SpO2 100%    BMI 28.48 kg/m??   Temp (24hrs), Avg:98.1 ??F (36.7 ??C), Min:98.1 ??F (36.7 ??C), Max:98.1 ??F (36.7 ??C)    General appearance: alert, cooperative and no distress  Mental Status: oriented to person, place and time and normal affect  Back: Point tenderness in C-spine and and T-spine.  Lungs: clear to auscultation bilaterally, normal effort  Heart: regular rate and rhythm, no murmur  Abdomen: Diffuse tenderness to palpation with guarding and mild rebound tenderness.  Soft.  Bowel sounds present.  Extremities: Trace pitting edema  bilaterally  Skin: no gross lesions, rashes  Neuro: Mild asterixis.  Weakness in right upper extremity and right lower extremity.    Data:     Labs:  Recent Labs     06/14/18  0600 06/15/18  0407   WBC 2.1* 2.3*   HGB 7.8* 7.9*   PLT 142 139     Recent Labs     06/14/18  0600 06/15/18  0406   NA 132* 133*   K 4.1 4.4   CL 99 99   CO2 21 23   BUN 23* 26*   CREATININE 1.19 1.16   GLUCOSE 191* 113*     Recent Labs     06/14/18  0600 06/15/18  0406   AST 60* 68*   ALT 28 32   BILITOT 0.5 0.5   ALKPHOS 134* 136*     MRI of the cervical spine:   1. Limited examination secondary to motion artifact and lack of IV contrast administration.   2. There is STIR hyperintensity noted within the vertebral bodies of C5 and C6 with asymmetrically increased hyperintensity within the C5-C6 intervertebral disc space, findings are suspicious for osteomyelitis/discitis the C5-C6 intervertebral disc and    the C5 and C6 vertebral bodies.   3. Degenerative changes noted C3-C7, with neural foraminal narrowing ranging from moderate to moderately severe. Moderate spinal canal stenosis is noted at C6-C7. Please see above for level by level details.   ??   MRI of the thoracic spine:   1. Limited examination secondary to motion artifact and lack of IV contrast administration.   2. There is no evidence of osteomyelitis, discitis or abscess within the MRI thoracic spine exam, given limitations imposed on interpretation and evaluation by lack of IV contrast administration.   3. Small T5-T6 right paracentral disc protrusion without frank spinal canal stenosis.   4. Central T6-T7 disc protrusion  measuring approximately 0.6 cm anterior posterior by 0.56 cm transverse, with mild mass effect on the ventral thoracic spinal cord.   5. Moderate circumferential disc bulge at T11-T12. See above for other level by level evaluation.   ??   MRI of the lumbar spine:   1. Limited examination secondary to motion artifact and lack of IV contrast administration.   2.  No evidence of osteomyelitis, discitis or abscess within the MRI lumbar spine exam, given limitations imposed on interpretation and evaluation by lack of IV contrast administration.   3. Multilevel degenerative disc disease and facet osteoarthropathy L1-S1, with possible abutment of the exiting right L5 spinal nerve root at L5-S1 level, by the facet osteoarthropathy. No spinal canal stenosis identified. Multilevel neural foraminal    narrowing noted ranging from mild to moderate to moderately severe, see above for level by level details.   4. Joint effusion at the left L4-L5 facet articulation.         Assessment and Plan:        49 year old male with cirrhosis (Hep B/C, and h/o heavy etOH use) presented with worsening abdominal pain and scrotal swelling.    1.  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 No obvious vegetation on TTE. Repeat BCx clear  -6 weeks IV vancomycin    3.  Iron deficiency anemia / Pancytopenia: Continue PPI.  Supplement iron. 1u pRBC given this admission    4. RUE and RLE weakness and sensory loss in setting of C5-6 OM, T5-7 disc protrusion with mild mass effect, and L5-S1 narrowing. MRI head negative  - continue PT/OT, pain control  -Appreciate neurology assistance    5. AKI suspect hemodynamically mediated from diuretics and paracenteses: Improving.  Additional albumin given yesterday.  Appreciate nephrology assistance    Diet: DIET GENERAL; Low Sodium (2 GM)  Ppx: lovenox  Full Code    Dispo: Patient medically clear for discharge to [facility].  Patient requesting to wait until tomorrow morning because he  knows the staff at [facility] and knows that over the weekend they do a bad job. Plan for AM d/c    Electronically signed by Lowry Bowl, DO on 06/15/2018 at 2:25 PM

## 2018-06-15 NOTE — Progress Notes (Signed)
Pharmacy Vancomycin Consult     Vancomycin Day: 9  Current Dosing: vancomycin 1000mg  IV q12hr    Temp max:  98.56F    Recent Labs     06/14/18  0600 06/15/18  0406   BUN 23* 26*       Recent Labs     06/14/18  0600 06/15/18  0406   CREATININE 1.19 1.16       Recent Labs     06/14/18  0600 06/15/18  0407   WBC 2.1* 2.3*         Intake/Output Summary (Last 24 hours) at 06/15/2018 0704  Last data filed at 06/15/2018 0531  Gross per 24 hour   Intake 2350 ml   Output 2150 ml   Net 200 ml       Culture Date      Source                       Results  Blood Culture, Routine   Date/Time Value Ref Range Status   06/09/2018 06:11 AM No growth after 5 days of incubation.  Final     Culture, Blood 2   Date/Time Value Ref Range Status   06/09/2018 02:23 PM No growth after 5 days of incubation.  Final         Ht Readings from Last 1 Encounters:   06/07/18 6\' 1"  (1.854 m)        Wt Readings from Last 1 Encounters:   06/14/18 215 lb 14.4 oz (97.9 kg)         Body mass index is 28.48 kg/m??.    Estimated Creatinine Clearance: 99 mL/min (based on SCr of 1.16 mg/dL).    Trough: 14.5    Assessment/Plan:  Trough is just below therapeutic range. Continue current dosing regimen. ID plan to continue vancomycin x 6 weeks.    Thank you,    Burlene Arnt, R.Ph.  06/15/2018  7:07 AM

## 2018-06-15 NOTE — Progress Notes (Signed)
PROGRESS NOTE -PAIN MANAGEMENT     SERVICE DATE:  06/15/2018   SERVICE TIME:  2:28 PM    CHIEFCOMPLAINT:    Chief Complaint   Patient presents with   . Groin Pain     neck and back pain, emisis            SUBJECTIVE:  [name] is a 48 y.o. 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    Patient states  pain is better controlled with the prescribed regimen, he is able to perspective in therapy, awaiting to be transferred to nursing home for long-term antibiotic treatment.    Receiving intermittent paracentesis.    PAIN  ASSESSMENT:    constant    aching and sharp    pain is perceived as moderate (4-6 pain scale)      MEDICATIONS:    Current Facility-Administered Medications   Medication Dose Route Frequency Provider Last Rate Last Dose   . furosemide (LASIX) tablet 40 mg  40 mg Oral Daily Synetta Shadow, MD   40 mg at 06/15/18 0757   . spironolactone (ALDACTONE) tablet 100 mg  100 mg Oral Daily Synetta Shadow, MD   100 mg at 06/15/18 0757   . methocarbamol (ROBAXIN) tablet 750 mg  750 mg Oral TID Colon Flattery, MD   750 mg at 06/15/18 1326   . vancomycin 1000 mg IVPB in 250 mL D5W addavial  1,000 mg Intravenous Q12H Eudelia Bunch, MD   Stopped at 06/15/18 [phone]   . sodium chloride flush 0.9 % injection 10 mL  10 mL Intravenous 2 times per day Eudelia Bunch, MD   10 mL at 06/15/18 1243   . sodium chloride flush 0.9 % injection 10 mL  10 mL Intravenous PRN Glenis Smoker Abbud, MD   10 mL at 06/13/18 0313   . oxyCODONE (ROXICODONE) immediate release tablet 5 mg  5 mg Oral Q4H PRN Earna Coder, MD   5 mg at 06/15/18 1326   . HYDROmorphone (DILAUDID) injection 1 mg  1 mg Intravenous Q4H PRN Earna Coder, MD   1 mg at 06/15/18 1211   . gabapentin (NEURONTIN) capsule 100 mg  100 mg  Oral TID Earna Coder, MD   100 mg at 06/15/18 1326   . ferrous sulfate tablet 325 mg  325 mg Oral Daily with breakfast Lowry Bowl, DO   325 mg at 06/15/18 0757   . melatonin tablet 10 mg  10 mg Oral Daily Lowry Bowl, DO   10 mg at 06/14/18 2112   . hydrOXYzine (ATARAX) tablet 25 mg  25 mg Oral Q6H PRN Lowry Bowl, DO   25 mg at 06/10/18 1325   . pantoprazole (PROTONIX) tablet 40 mg  40 mg Oral QAM AC Yazid R Hussein, DO   40 mg at 06/15/18 0511   . lidocaine 4 % external patch 3 patch  3 patch Transdermal Daily Earna Coder, MD   3 patch at 06/15/18 0758   . vancomycin (VANCOCIN) intermittent dosing Leisure centre manager)   Other RX Placeholder Rita A Abbud, MD       . sodium chloride flush 0.9 % injection 10 mL  10 mL Intravenous 2 times per day Rose Fillers, APRN - CNP   10 mL at  06/14/18 2116   . sodium chloride flush 0.9 % injection 10 mL  10 mL Intravenous PRN Rose FillersAngela Shay, APRN - CNP       . polyethylene glycol (GLYCOLAX) packet 17 g  17 g Oral Daily PRN Rose FillersAngela Shay, APRN - CNP       . promethazine (PHENERGAN) tablet 12.5 mg  12.5 mg Oral Q6H PRN Rose FillersAngela Shay, APRN - CNP       . ondansetron (ZOFRAN) injection 4 mg  4 mg Intravenous Q6H PRN Rose FillersAngela Shay, APRN - CNP   4 mg at 06/15/18 0250   . [Held by provider] enoxaparin (LOVENOX) injection 40 mg  40 mg Subcutaneous Daily Rose FillersAngela Shay, APRN - CNP   Stopped at 06/11/18 2100   . nicotine (NICODERM CQ) 21 MG/24HR 1 patch  1 patch Transdermal Daily Rose FillersAngela Shay, APRN - CNP   1 patch at 06/15/18 0758   . lactulose (CHRONULAC) 10 GM/15ML solution 20 g  20 g Oral TID Rose FillersAngela Shay, APRN - CNP   20 g at 06/15/18 0757   . tenofovir (VIREAD) tablet 300 mg  300 mg Oral Daily Rose FillersAngela Shay, APRN - CNP             ALLERGIES:  Nubain [nalbuphine]; Morphine; Penicillin g; and Tylenol [acetaminophen]    Review of Systems   Constitutional: Positive for activity change, appetite change, fatigue and unexpected weight change. Negative for chills, diaphoresis and fever.   HENT:  Negative.    Eyes: Negative.    Respiratory: Negative.    Cardiovascular: Negative.    Gastrointestinal: Positive for abdominal distention, abdominal pain and nausea. Negative for anal bleeding, blood in stool, constipation, diarrhea, rectal pain and vomiting.   Endocrine: Negative.    Genitourinary: Negative.    Musculoskeletal: Positive for arthralgias, back pain, myalgias, neck pain and neck stiffness. Negative for gait problem and joint swelling.   Skin: Negative.    Allergic/Immunologic: Negative.    Neurological: Positive for weakness, light-headedness, numbness and headaches. Negative for dizziness, tremors, seizures, syncope, facial asymmetry and speech difficulty.   Psychiatric/Behavioral: Positive for behavioral problems and decreased concentration. Negative for agitation, confusion, dysphoric mood, hallucinations, self-injury, sleep disturbance and suicidal ideas. The patient is not nervous/anxious and is not hyperactive.            OBJECTIVE  PHYSICAL EXAM:  BP (!) 98/52   Pulse 64   Temp 98.1 F (36.7 C) (Oral)   Resp 17   Ht 6\' 1"  (1.854 m)   Wt 215 lb 14.4 oz (97.9 kg)   SpO2 100%   BMI 28.48 kg/m   Body mass index is 28.48 kg/m.  CONSTITUTIONAL:  awake, alert, cooperative, no apparent distress, and appears stated age  NECK: Severe tenderness across the cervical region, severe cervical spinous process tenderness, moderate severe restriction of range of motion.  BACK: Restricted thoracolumbar range of motion.  ABDOMEN: Abdominal exam seem to be moderately distended, generally tender, mild rebound tenderness, otherwise he looks much more comfortable than prior.  MUSCULOSKELETAL:  there is no redness, warmth, or swelling of the joints  full range of motion noted  motor strength is 5 out of 5 all extremities bilaterally  tone is normal  NEUROLOGIC: Neurological exam showed weakness in the right upper extremity however much improved  Mental Status Exam:  Level of Alertness:   lethargic  SKIN:  no  bruising or bleeding    DATA:   Diagnostic tests reviewed for today's visit:    All labs  and imaging results reviewed.     Lab Results   Component Value Date    WBC 2.3 06/15/2018    HGB 7.9 06/15/2018    HCT 24.8 06/15/2018    MCV 69.6 06/15/2018    PLT 139 06/15/2018    NA 133 06/15/2018    K 4.4 06/15/2018    CL 99 06/15/2018    CO2 23 06/15/2018    BUN 26 06/15/2018    CREATININE 1.16 06/15/2018    CALCIUM 8.3 06/15/2018    ALKPHOS 136 06/15/2018    ALT 32 06/15/2018    AST 68 06/15/2018    BILITOT 0.5 06/15/2018    LABALBU 2.8 06/15/2018    LABALBU 3.1 05/31/2016   LABS  Recent Labs     06/13/18  0542 06/14/18  0600 06/15/18  0407   WBC 2.7* 2.1* 2.3*   RBC 3.33* 3.54* 3.56*   HGB 7.3* 7.8* 7.9*   HCT 23.0* 24.9* 24.8*   MCV 69.0* 70.3* 69.6*   MCH 21.9* 21.9* 22.2*   MCHC 31.7* 31.2* 31.9*   RDW 23.4* 23.3* 23.2*   PLT 93* 142 139       Recent Labs     06/13/18  0545 06/14/18  0600 06/15/18  0406   NA 132* 132* 133*   K 5.0* 4.1 4.4   CL 100 99 99   CO2 22 21 23    BUN 22* 23* 26*   CREATININE 1.35* 1.19 1.16   GLUCOSE 98 191* 113*   CALCIUM 8.2* 8.3* 8.3*       No results for input(s): MG in the last 72 hours.      Xr Chest Standard (2 Vw)    Result Date: 06/08/2018  Patient MRN: [id number] DOB: [birth year] Age:  77 years Gender: Male Order Date: 06/08/2018 1:30 PM. Exam: XR CHEST (2 VW) Number of Views: 2 Indication:  High-grade fever, sore throat cough and severe neck pain x3 days. Cough and fever. Comparison: None Findings: Cardiomediastinal silhouette is within normal limits. Lungs are clear without evidence of infiltrate/pneumonia, pneumothorax or pleural effusion     Impression:  No radiographic evidence of acute cardiopulmonary disease     Ct Abdomen Pelvis W Iv Contrast Additional Contrast? None    Result Date: 06/08/2018  Patient MRN: [id number] DOB: [birth year] Age:  37 years Order Date: 06/07/2018 4:30 PM. Gender: Male Exam: CT ABDOMEN PELVIS W IV CONTRAST Number of Images: 603 Indication:   Abdominal pain,  swelling, testicular swelling, history of cirrhosis Contrast dosage: Isovue-370, 100 mL, IV Comparison: None Findings: This examination was performed on a CT scanner with dose reduction. Technique: Low-dose CT  acquisition technique included one of following options; 1 . Automated exposure control, 2. Adjustment of MA and or KV according to patient's size or 3. Use of iterative reconstruction. Visualized lung bases demonstrate no evidence of noncalcified pulmonary nodule, spiculated mass, pneumothorax, hemothorax, pleural effusion or focal areas of airspace consolidation.. Large volume intraperitoneal ascites with a nodular contour of the shrunken appearing liver. Splenomegaly. Mild sized hiatal hernia. Esophageal varices noted. Pancreas, adrenal glands and kidneys unremarkable. Appendix not identified. Fluid extends into a large right inguinal hernia and into the scrotum where there are large bilateral fluid collections. No definitive evidence of bowel obstruction or free intraperitoneal air. Delayed imaging sequences demonstrate there is partial opacification of the renal, ureteral or bladder areas. No large filling defect or mass is seen. No contrast extravasation outside normal renal, ureteral or bladder confines. There are  portions of the  renal, ureteral or bladder systems are not opacified and are therefore not evaluated.. Vertebral body heights and intervertebral disc spaces are well-preserved. There is normal sagittal alignment of the visualized spine. No lytic or blastic bony lesions..     1. Findings consistent with decompensated cirrhosis with large volume intraperitoneal ascites, a shrunken liver with nodular contour, splenomegaly and esophageal variceal formation. 2. No evidence of bowel obstruction. 3. There is a large fluid-filled right inguinal hernia with large amounts of fluid extending within the scrotum.            ASSESSMENT & PLAN  Active Hospital Problems    Diagnosis Date Noted   . MSSA  bacteremia [R78.81] 06/11/2018   . Osteomyelitis of cervical vertebra (HCC) [M46.22] 06/11/2018   . Cervicalgia [M54.2] 06/11/2018   . 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 Degeneration of thoracic intervertebral disc [M51.34] 06/11/2018   . Degeneration of lumbar intervertebral disc [M51.36] 06/11/2018   . Muscle weakness of upper extremity [M62.81] 06/11/2018   . Abdominal pain [R10.9] 06/08/2018   . Ascites of liver [R18.8] 06/08/2018   . Visceral hyperalgesia [R19.8] 06/08/2018   . Generalized muscle ache [M79.10] 06/08/2018   . Cirrhosis (HCC) [K74.60] 06/07/2018            RECOMMENDATION:  SEE ORDERS    Discussed outpatient protocol for pain management, will be discontinuing IV Dilaudid since he is leaving the hospital, switching to the oxycodone 5 mg every 6 hours as needed as needed for pain while he has been receiving some therapy as well as IV antibiotic, to be monitored through the nursing home.  I discussed weaning plan once able to control the infection will reevaluate start weaning from the nursing home after few days.      Continuation of the oral antibiotics, will help also decrease the pain and decrease inflammation however patient is not candidate for Tylenol or any anti-inflammatory at this time, I would believe that the Robaxin as a muscle action will be helpful for him to continue as an outpatient as well.      Discussed also outpatient prescription was topical analgesics, to continue the treatment plan and to follow-up as an outpatient PRN.    SIGNATURE: Earna Coder, MD PATIENT NAME:  Travis Ortega   DATE: June 15, 2018 MRN: [id number]   TIME: 2:28 PM PAGER/CONTACT #: [phone]

## 2018-06-16 LAB — COMPREHENSIVE METABOLIC PANEL W/ REFLEX TO MG FOR LOW K
ALT: 37 U/L (ref 0–41)
AST: 79 U/L — ABNORMAL HIGH (ref 0–40)
Albumin: 2.7 g/dL — ABNORMAL LOW (ref 3.5–4.6)
Alkaline Phosphatase: 141 U/L — ABNORMAL HIGH (ref 35–104)
Anion Gap: 10 mEq/L (ref 9–15)
BUN: 23 mg/dL — ABNORMAL HIGH (ref 6–20)
CO2: 23 mEq/L (ref 20–31)
Calcium: 8.1 mg/dL — ABNORMAL LOW (ref 8.5–9.9)
Chloride: 101 mEq/L (ref 95–107)
Creatinine: 1.1 mg/dL (ref 0.70–1.20)
GFR African American: >60 (ref >60)
GFR Non-African American: >60 (ref >60)
Globulin: 3.5 g/dL (ref 2.3–3.5)
Glucose: 97 mg/dL (ref 70–99)
Potassium reflex Magnesium: 4.3 mEq/L (ref 3.4–4.9)
Sodium: 134 mEq/L — ABNORMAL LOW (ref 135–144)
Total Bilirubin: 0.4 mg/dL (ref 0.2–0.7)
Total Protein: 6.2 g/dL — ABNORMAL LOW (ref 6.3–8.0)

## 2018-06-16 LAB — CBC WITH AUTO DIFFERENTIAL
Basophils %: 1.5 %
Basophils Absolute: 0 10*3/uL (ref 0.0–0.2)
Eosinophils %: 3.1 %
Eosinophils Absolute: 0.1 10*3/uL (ref 0.0–0.7)
Hematocrit: 24.7 % — ABNORMAL LOW (ref 42.0–52.0)
Hemoglobin: 7.6 g/dL — ABNORMAL LOW (ref 14.0–18.0)
Lymphocytes %: 32.1 %
Lymphocytes Absolute: 0.7 10*3/uL — ABNORMAL LOW (ref 1.0–4.8)
MCH: 21.3 pg — ABNORMAL LOW (ref 27.0–31.3)
MCHC: 30.7 % — ABNORMAL LOW (ref 33.0–37.0)
MCV: 69.4 fL — ABNORMAL LOW (ref 80.0–100.0)
Monocytes %: 19 %
Monocytes Absolute: 0.4 10*3/uL (ref 0.2–0.8)
Neutrophils %: 44.3 %
Neutrophils Absolute: 0.9 10*3/uL — ABNORMAL LOW (ref 1.4–6.5)
Platelets: 137 10*3/uL (ref 130–400)
RBC: 3.56 M/uL — ABNORMAL LOW (ref 4.70–6.10)
RDW: 23.1 % — ABNORMAL HIGH (ref 11.5–14.5)
WBC: 2.1 10*3/uL — ABNORMAL LOW (ref 4.8–10.8)

## 2018-06-16 MED ORDER — ALTEPLASE 2 MG IJ SOLR
2 MG | Freq: Once | INTRAMUSCULAR | Status: AC
Start: 2018-06-16 — End: 2018-06-16
  Administered 2018-06-16: 20:00:00 1 mg

## 2018-06-16 MED FILL — OXYCODONE HCL 5 MG PO TABS: 5 mg | ORAL | Qty: 1

## 2018-06-16 MED FILL — ONDANSETRON HCL 4 MG/2ML IJ SOLN: 4 MG/2ML | INTRAMUSCULAR | Qty: 2

## 2018-06-16 MED FILL — GABAPENTIN 100 MG PO CAPS: 100 mg | ORAL | Qty: 1

## 2018-06-16 MED FILL — HYDROMORPHONE HCL 1 MG/ML IJ SOLN: 1 mg/mL | INTRAMUSCULAR | Qty: 1

## 2018-06-16 MED FILL — SPIRONOLACTONE 50 MG PO TABS: 50 mg | ORAL | Qty: 2

## 2018-06-16 MED FILL — METHOCARBAMOL 750 MG PO TABS: 750 mg | ORAL | Qty: 1

## 2018-06-16 MED FILL — VANCOMYCIN HCL 1 G IV SOLR: 1 g | INTRAVENOUS | Qty: 1000

## 2018-06-16 MED FILL — ALTEPLASE 2 MG IJ SOLR: 2 mg | INTRAMUSCULAR | Qty: 1

## 2018-06-16 MED FILL — MELATONIN 1 MG PO TABS: 1 mg | ORAL | Qty: 1

## 2018-06-16 MED FILL — FUROSEMIDE 40 MG PO TABS: 40 mg | ORAL | Qty: 1

## 2018-06-16 MED FILL — LACTULOSE 10 GM/15ML PO SOLN: 10 GM/15ML | ORAL | Qty: 30

## 2018-06-16 MED FILL — FERROUS SULFATE 325 (65 FE) MG PO TABS: 325 (65 Fe) MG | ORAL | Qty: 1

## 2018-06-16 MED FILL — LIDOCAINE PAIN RELIEF 4 % EX PTCH: 4 % | CUTANEOUS | Qty: 3

## 2018-06-16 MED FILL — NICOTINE 21 MG/24HR TD PT24: 21 mg/(24.h) | TRANSDERMAL | Qty: 1

## 2018-06-16 MED FILL — PANTOPRAZOLE SODIUM 40 MG PO TBEC: 40 mg | ORAL | Qty: 1

## 2018-06-16 NOTE — Discharge Instructions (Signed)
???   Good nutrition is important when healing from an illness, injury, or surgery.  Follow any nutrition recommendations given to you during your hospital stay.   ??? If you were given an oral nutrition supplement while in the hospital, continue to take this supplement at home.  You can take it with meals, in-between meals, and/or before bedtime. These supplements can be purchased at most local grocery stores, pharmacies, and chain super-stores.   ??? If you have any questions about your diet or nutrition, call the hospital and ask for the dietitian.      REGULAR FOODS

## 2018-06-16 NOTE — Progress Notes (Signed)
[facility] here to pick up pt.

## 2018-06-16 NOTE — Progress Notes (Signed)
Brief Note:  Po lasix and spironolactone resumed on Saturday and function has remained stable, hyponatremia w/ mild improvement  - will sign off as function stable  - no follow up necessary    Alex Gardener, MD

## 2018-06-16 NOTE — Progress Notes (Signed)
Patient resting in bed was up and about in his room; will be discharge back to the [facility] today with IV antibiotic; Dual port piccline in right upper arm was flushed and patent this morning but now having a hard time to flush both ports; cath flo ordered per Dr Pricilla Riffle and waiting for it now. Lungs clear. No edema. Pick up time after 6:00pm, after antibiotic given.

## 2018-06-16 NOTE — Discharge Summary (Signed)
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 *    Chief Complaint   Patient presents with   ??? Groin Pain     neck and back pain, emisis        Condition: improved   Activity: no restrictions   Diet: salt restriction, 2L fluid restriction  Disposition: SNF  Functional Status: ambulatory    Significant Findings:     MRI of the cervical spine:   1. Limited examination secondary to motion artifact and lack of IV contrast administration.   2. There is STIR hyperintensity noted within the vertebral bodies of C5 and C6 with asymmetrically increased hyperintensity within the C5-C6 intervertebral disc space, findings are suspicious for osteomyelitis/discitis the C5-C6 intervertebral disc and    the C5 and C6 vertebral bodies.   3. Degenerative changes noted C3-C7, with neural foraminal narrowing ranging from moderate to moderately severe. Moderate spinal canal stenosis is noted at C6-C7. Please see above for level by level details.   ??   MRI of the thoracic spine:   1. Limited examination secondary to motion artifact and lack of IV contrast administration.   2. There is no evidence of osteomyelitis,  discitis or abscess within the MRI thoracic spine exam, given limitations imposed on interpretation and evaluation by lack of IV contrast administration.   3. Small T5-T6 right paracentral disc protrusion without frank spinal canal stenosis.   4. Central T6-T7 disc protrusion measuring approximately 0.6 cm anterior posterior by 0.56 cm transverse, with mild mass effect on the ventral thoracic spinal cord.   5. Moderate circumferential disc bulge at T11-T12. See above for other level by level evaluation.   ??   MRI of the lumbar spine:   1. Limited examination secondary to motion artifact and lack of IV contrast administration.   2. No evidence of osteomyelitis, discitis or abscess within the MRI lumbar spine exam, given limitations imposed on interpretation and evaluation by lack of IV contrast administration.   3. Multilevel degenerative disc disease and facet osteoarthropathy L1-S1, with possible abutment of the exiting right L5 spinal nerve root at L5-S1 level, by the facet osteoarthropathy. No spinal canal stenosis identified. Multilevel neural foraminal    narrowing noted ranging from mild to moderate to moderately severe, see above for level by level details.   4. Joint effusion at the left L4-L5 facet articulation.     MRI Brain: NO ACUTE INTRACRANIAL PROCESS IDENTIFIED.    2/22: 1/2 BCx + Staph aureus (MSSA). 2/24 no growth  Limited echo for evaluation for endocarditis.   No obvious valve vegetations or regurgitation.   Only fair quality study, could miss small vegetation.    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  He was noted to have iron deficiency anemia and pancytopenia which mostly remained stable throughout the hospitalization-he received 1 unit of packed red blood cells and was continued on a PPI.  He was seen by GI during his hospitalization.  He was drained multiple times by interventional radiology-the ascitic fluid was suggestive of chylous ascites and was negative for SBP.  Infectious disease recommended 6 weeks of vancomycin for his infection.  He had a right upper extremity and right lower extremity weakness and sensory loss due to findings shown on the MRI-he was seen by neurology throughout the hospitalization who recommended continued PT/OT and pain control which was provided by the pain management service who provided his discharge scripts.  His course was also complicated by AKI suspected to be hemodynamically mediated from diuretics and paracentesis-this improved even after diuretics were reintroduced.  Nephrology was consulted during the hospitalization.    He is a medically complex patient with multiple active medical problems which at the moment are stable-his large volume ascites will likely need to be therapeutically drained again in the near future.  His renal function will need to continue to be monitored, and if stable, consider advancing diuretics.  He should continue PT/OT to help his deficits from his spinal lesions shown on the MRI.  If all goes well, after treatment of his infection, he should have TIPS evaluation at [facility].    Exam on Discharge:   BP 84/62    Pulse 74    Temp 97.9 ??F (36.6 ??C) (Oral)    Resp 16    Ht 6\' 1"  (1.854 m)    Wt 215 lb 14.4 oz (97.9 kg)    SpO2 100%    BMI 28.48 kg/m??   General appearance: alert, cooperative and no distress  Mental Status:  oriented to person, place and time and normal affect  Back: Point tenderness in C-spine and and T-spine.  Lungs: clear to auscultation bilaterally, normal effort  Heart: regular rate and rhythm, no murmur  Abdomen: Diffuse tenderness to palpation with guarding and mild rebound tenderness.  Soft.  Bowel sounds present.  Extremities: Trace pitting edema bilaterally  Skin: no gross lesions, rashes  Neuro: Mild asterixis.  Weakness in right upper extremity and right lower extremity.    Discharge Medication List:   Paulino, Sartin   Home Medication Instructions OLM:[id number]    Printed on:06/16/18 1348   Medication Information                      ferrous sulfate 325 (65 Fe) MG tablet  Take 1 tablet by mouth daily (with breakfast)  furosemide (LASIX) 40 MG tablet  Take 1 tablet by mouth daily             ipratropium-albuterol (DUONEB) 0.5-2.5 (3) MG/3ML SOLN nebulizer solution  Inhale 1 vial into the lungs every 6 hours as needed for Shortness of Breath             lactulose encephalopathy 10 GM/15ML SOLN solution  Take 20 g by mouth 3 times daily Indications: pt unsure of dose             lidocaine 4 % external patch  Place 3 patches onto the skin daily             melatonin 3 MG TABS tablet  Take 6 mg by mouth daily             methocarbamol (ROBAXIN) 750 MG tablet  Take 1 tablet by mouth 3 times daily for 10 days             omeprazole (PRILOSEC) 20 MG delayed release capsule  Take 2 capsules by mouth every morning (before breakfast)             ondansetron (ZOFRAN) 4 MG tablet  Take 4 mg by mouth Daily              oxyCODONE (ROXICODONE) 5 MG immediate release tablet  Take 1 tablet by mouth every 6 hours as needed for Pain for up to 3 days.             polyethylene glycol (GLYCOLAX) powder  take 17GM (DISSOLVED IN WATER) by mouth once daily             spironolactone (ALDACTONE) 100 MG tablet  Take 100 mg by mouth daily             tenofovir (VIREAD) 300 MG tablet  TK 1 T PO Daily              triamcinolone (KENALOG) 0.025 % cream  APP EXT AA BID             vancomycin (VANCOCIN) infusion  Infuse 1,000 mg intravenously every 12 hours Compound per protocol.  Vanco trough before 3rd dose  Vanco trough, CBC BMP weekly  CRP in 3 and 6 weeks  Fax results to [phone]  F/U with me in 4 weeks [phone]  Fax results to [phone]                 DC time 37 minutes    Signed:  Lowry Bowl  06/16/2018, 1:48 PM

## 2018-06-16 NOTE — Progress Notes (Signed)
Physical Therapy  Facility/Department: [facility] [id number]  Physical Therapy Discharge      NAME: Travis Ortega    DOB: 11-Jan-1971 (48 y.o.)  MRN: [id number]    Account: [id number]  Gender: male      Patient has been discharged from acute care PT POC. Continues to decline working with therapy and observed independently ambulating the halls.       Electronically signed by Eveline Keto, PT on 06/16/18 at 4:28 PM

## 2018-06-16 NOTE — Discharge Instructions (Signed)
TAKE PRESCRIBED MEDICATIONS AS DIRECTED.    FOLLOW UP WITH ALL DOCTOR'S APPOINTMENTS.

## 2018-06-16 NOTE — Progress Notes (Signed)
[facility] Daily Progress Note  Name: Travis Ortega  Age: 48 y.o.  Gender: male  CodeStatus: Full Code  Allergies: Nubain [Nalbuphine]  Morphine  Penicillin G  Tylenol [Acetaminophen]    Chief Complaint:Groin Pain (neck and back pain, emisis )    Primary Care Provider: Yaakov Guthrie, APRN - CNP  InpatientTreatment Team: Treatment Team: Attending Provider: Lowry Bowl, DO; Consulting Physician: Thamas Jaegers, MD; Consulting Physician: Mirian Capuchin, MD; Consulting Physician: Eudelia Bunch, MD; Consulting Physician: Earna Coder, MD; Consulting Physician: Windy Fast, MD; Consulting Physician: Wynelle Chatham, MD; Nursing Student: Tyna Jaksch; Utilization Reviewer: Delories Heinz, RN; Registered Nurse: Lavella Lemons, RN; Case Manager: Doylene Canard, RN; Patient Care Tech: Anette Guarneri; Social Worker: Nathaneil Canary, LSW  Admission Date: 06/07/2018      HPI Pt seen and examined for neuro follow up for right sided weakness. Currently A&O x3, NAD, cooperative. Still with right sided weakness upper > lower. Pt with c/o pain down right arm that radiates from neck/shoulder and tingling to right hand. Also with limited ROM, up to about  90 degrees than becomes too painful.   NO Headache, double vision, blurry vision, difficulty with speech, difficulty with swallowing, weakness, numbness, pain, nausea, vomiting, choking, neck pain, dizziness  No ne or acute neuro complaints/concerns  Vitals:    06/16/18 0905   BP: 84/62   Pulse: 74   Resp: 16   Temp: 97.9 ??F (36.6 ??C)   SpO2: 100%      Review of Systems   Constitutional: Negative for appetite change, chills, fatigue and fever.   HENT: Negative for hearing loss and trouble swallowing.    Eyes: Negative for visual disturbance.   Respiratory: Negative for cough, chest tightness, shortness of breath and wheezing.    Cardiovascular: Negative for chest pain, palpitations and leg swelling.   Genitourinary: Negative for difficulty urinating.    Musculoskeletal: Positive for gait problem.   Skin: Negative for color change and rash.   Neurological: Positive for weakness (RUE) and numbness (right arm). Negative for dizziness, tremors, seizures, syncope, facial asymmetry, speech difficulty, light-headedness and headaches.   Psychiatric/Behavioral: Negative for agitation, confusion and hallucinations. The patient is not nervous/anxious.      Physical Exam  Vitals signs and nursing note reviewed.   Constitutional:       General: He is not in acute distress.     Appearance: He is ill-appearing. He is not diaphoretic.   HENT:      Head: Normocephalic and atraumatic.   Eyes:      General: No visual field deficit.     Extraocular Movements: Extraocular movements intact.      Pupils: Pupils are equal, round, and reactive to light.   Cardiovascular:      Rate and Rhythm: Normal rate and regular rhythm.   Pulmonary:      Effort: Pulmonary effort is normal. No respiratory distress.      Breath sounds: Normal breath sounds.   Abdominal:      General: Bowel sounds are normal. There is no distension.      Palpations: Abdomen is soft.      Tenderness: There is no abdominal tenderness.   Skin:     General: Skin is warm and dry.   Neurological:      Mental Status: He is alert and oriented to person, place, and time.      Cranial Nerves: No cranial nerve deficit, dysarthria or facial asymmetry.  Motor: Weakness (RUE) present. No tremor or seizure activity.               Medications:  Reviewed    Infusion Medications:   Scheduled Medications:   ??? furosemide  40 mg Oral Daily   ??? spironolactone  100 mg Oral Daily   ??? methocarbamol  750 mg Oral TID   ??? vancomycin  1,000 mg Intravenous Q12H   ??? sodium chloride flush  10 mL Intravenous 2 times per day   ??? gabapentin  100 mg Oral TID   ??? ferrous sulfate  325 mg Oral Daily with breakfast   ??? melatonin  10 mg Oral Daily   ??? pantoprazole  40 mg Oral QAM AC   ??? lidocaine  3 patch Transdermal Daily   ??? vancomycin (VANCOCIN)  intermittent dosing Leisure centre manager)   Other RX Placeholder   ??? sodium chloride flush  10 mL Intravenous 2 times per day   ??? [Held by provider] enoxaparin  40 mg Subcutaneous Daily   ??? nicotine  1 patch Transdermal Daily   ??? lactulose  20 g Oral TID   ??? tenofovir  300 mg Oral Daily     PRN Meds: sodium chloride flush, oxyCODONE, HYDROmorphone **OR** [DISCONTINUED] HYDROmorphone, hydrOXYzine, sodium chloride flush, polyethylene glycol, promethazine, ondansetron    Labs:   Recent Labs     06/14/18  0600 06/15/18  0407 06/16/18  0522   WBC 2.1* 2.3* 2.1*   HGB 7.8* 7.9* 7.6*   HCT 24.9* 24.8* 24.7*   PLT 142 139 137     Recent Labs     06/14/18  0600 06/15/18  0406 06/16/18  0528   NA 132* 133* 134*   K 4.1 4.4 4.3   CL 99 99 101   CO2 21 23 23    BUN 23* 26* 23*   CREATININE 1.19 1.16 1.10   CALCIUM 8.3* 8.3* 8.1*     Recent Labs     06/14/18  0600 06/15/18  0406 06/16/18  0528   AST 60* 68* 79*   ALT 28 32 37   BILITOT 0.5 0.5 0.4   ALKPHOS 134* 136* 141*     No results for input(s): INR in the last 72 hours.  No results for input(s): CKTOTAL, TROPONINI in the last 72 hours.    Urinalysis:   Lab Results   Component Value Date    NITRU Negative 06/10/2018    WBCUA 0-2 07/10/2015    BACTERIA None 07/10/2015    RBCUA 3-5 07/10/2015    BLOODU Negative 06/10/2018    SPECGRAV 1.016 06/10/2018    GLUCOSEU Negative 06/10/2018       Radiology:   Most recent    EEG No procedure found.    MRI of Brain   Results for orders placed during the hospital encounter of 06/07/18   MRI BRAIN W WO CONTRAST    Narrative MRI BRAIN W WO CONTRAST : 06/12/2018 1:15 PM    CLINICAL HISTORY:  right sided weakness in the setting of OM, r/o abscess .    COMPARISON: Cervical, thoracic and lumbar spine MRIs 06/10/2018.    TECHNIQUE: Multiplanar MR imaging of the head was performed before and after intravenous administration of approximately 15 mL of ProHance gadolinium contrast.      FINDINGS:     Motion artifact limits some sequences.    There is no  infarct, intracranial hemorrhage, mass effect, midline shift, extra-axial collection, hydrocephalus, or abnormal enhancement.  Minimal to mild nonspecific predominantly subcortical supratentorial white matter changes are most likely chronic small vessel ischemic disease.          Impression NO ACUTE INTRACRANIAL PROCESS IDENTIFIED.             No results found for this or any previous visit.                          MRA of the Head and Neck: No results found for this or any previous visit.No results found for this or any previous visit.No results found for this or any previous visit.                          CT of the Head: No results found for this or any previous visit.No results found for this or any previous visit.No results found for this or any previous visit.    Carotid duplex: No results found for this or any previous visit.No results found for this or any previous visit.No results found for this or any previous visit.    Echo No results found for this or any previous visit.          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 No concerning spinal cord compression noted.??  MRI brain no acute findings  MRI findings are very subtle for OM, sed rate, CRP, procalcitonin mildly elevated, ID following  Pain management following  May need EMG/further imaging as OP for right arm weakness  OK to DC from neuro standpoint  Follow up 6 weeks  Pt to DC [facility] today    Lailee Hoelzel R. Allena KatzPatel, MD, FAHA  Diplomate, American Board of Psychiatry & Neurology  Board Certified in Vascular Neurology  Board Certified in Neuromuscular Medicine  Certified in Neurorehabilitation       Collaborating physicians: Dr Allena KatzPatel    Electronically signed by Emmaline LifeLisa M Trockley, APRN - CNP on 06/16/2018 at 11:26  AM

## 2018-06-16 NOTE — Discharge Instructions (Signed)
UP AS TOLERATED.

## 2018-06-16 NOTE — Progress Notes (Signed)
Patient resting in bed; Cath flo placed in dual port piccline; now the purple port was able to be flushed and patent so vanco IV axb. Started at this time. Will be checking the red port at 17:00pm for flushing. Appetite good today but had some nausea. Report called and given to Marcelino Duster RN at [facility] with understanding. Scripts in [employer] executive. Right port piccline dressing changed today.

## 2018-06-16 NOTE — Care Coordination-Inpatient (Signed)
Met with pt who confirmed DC to [facility]. Reviewed IMM.

## 2018-06-17 ENCOUNTER — Encounter: Attending: Urology | Primary: Family

## 2018-06-20 NOTE — Telephone Encounter (Signed)
Per Nurse Jeanice Lim, she states patient will be D/c from SNF, and will follow up w/ O/P Infusion to complete his IV Abx Thx. Wants to Update Dr Ruben Reason.

## 2018-06-22 ENCOUNTER — Inpatient Hospital Stay: Primary: Family

## 2018-06-22 ENCOUNTER — Ambulatory Visit: Primary: Family

## 2018-06-23 ENCOUNTER — Inpatient Hospital Stay: Admit: 2018-06-23 | Primary: Family

## 2018-06-23 ENCOUNTER — Ambulatory Visit: Primary: Family

## 2018-06-23 DIAGNOSIS — R7881 Bacteremia: Secondary | ICD-10-CM

## 2018-06-23 MED ORDER — DEXTROSE 5 % IV SOLN
5 % | Freq: Once | INTRAVENOUS | Status: AC
Start: 2018-06-23 — End: 2018-06-23
  Administered 2018-06-23: 19:00:00 1500 mg via INTRAVENOUS

## 2018-06-23 MED FILL — VANCOMYCIN HCL 5 G IV SOLR: 5 g | INTRAVENOUS | Qty: 1500

## 2018-06-23 NOTE — Other (Signed)
Patient did not show up over the weekend. Spoke to [facility] to make them aware. Left messages with his emergency contact # sister and mother. Awaiting a call back.

## 2018-06-23 NOTE — Other (Signed)
Infusion complete. Tolerated well. Ambulated off of the unit. All equipment used in the care for this patient has been cleaned.
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
## 2018-06-23 NOTE — Other (Signed)
Patient arrived, alert oriented

## 2018-06-24 ENCOUNTER — Ambulatory Visit: Primary: Family

## 2018-06-24 ENCOUNTER — Inpatient Hospital Stay: Primary: Family

## 2018-06-24 ENCOUNTER — Inpatient Hospital Stay: Admit: 2018-06-24 | Primary: Family

## 2018-06-24 DIAGNOSIS — R7881 Bacteremia: Secondary | ICD-10-CM

## 2018-06-24 MED ORDER — DEXTROSE 5 % IV SOLN
5 % | Freq: Once | INTRAVENOUS | Status: AC
Start: 2018-06-24 — End: 2018-06-24
  Administered 2018-06-24: 18:00:00 1500 mg via INTRAVENOUS

## 2018-06-24 MED FILL — VANCOMYCIN HCL 5 G IV SOLR: 5 g | INTRAVENOUS | Qty: 1500

## 2018-06-24 NOTE — Other (Signed)
Patient was scheduled for infusion this morning, has not arrived as of this time. Call placed to home phone and voicemail left reminding patient of the appointment.

## 2018-06-24 NOTE — Other (Signed)
Infusion complete, patient tolerated well. Left unit via wheelchair with family assistance.

## 2018-06-24 NOTE — Other (Addendum)
Patient arrived, states he was nauseated this am and was unable to make it in to appointment until now. Patient states he has been on iv vancomycin for a while with no GI  issues and does not think it is related to infusion.patient did state he was recently restarted on medication Viread, states he had nausea with this medication in the past.patient encouraged to notify prescribing physician that he believes he is having nausea from viread.  Did review with patient and sister who was present in room  the importance of staying on schedule with antibiotics. Patient and sister verbalized understanding.

## 2018-06-25 ENCOUNTER — Ambulatory Visit: Primary: Family

## 2018-06-25 ENCOUNTER — Encounter

## 2018-06-25 ENCOUNTER — Inpatient Hospital Stay: Admit: 2018-06-25 | Primary: Family

## 2018-06-25 DIAGNOSIS — R7881 Bacteremia: Secondary | ICD-10-CM

## 2018-06-25 MED ORDER — VANCOMYCIN HCL 1 G IV SOLR
1 g | Freq: Once | INTRAVENOUS | Status: AC
Start: 2018-06-25 — End: 2018-06-25
  Administered 2018-06-25: 14:00:00 1500 mg via INTRAVENOUS

## 2018-06-25 MED FILL — VANCOMYCIN HCL 5 G IV SOLR: 5 g | INTRAVENOUS | Qty: 1500

## 2018-06-25 MED FILL — VANCOMYCIN HCL 1 G IV SOLR: 1 g | INTRAVENOUS | Qty: 1500

## 2018-06-25 NOTE — Other (Signed)
Infusion complete, patient tolerated well.

## 2018-06-25 NOTE — Other (Signed)
Patient left the unit ambulatory. All equipment used in the care for this patient has been cleaned. We will notify him of the plan when ID office returns call regarding picc line.

## 2018-06-25 NOTE — Other (Signed)
Patient to the floor ambulatory for his iv vancomycin. Vital signs taken. Denies any discomfort. Call light within reach. Spoke to specials and they will have Travis Ortega come and see him today regarding a paracentesis.

## 2018-06-25 NOTE — Other (Signed)
Spoke to [place] at ID office. Made her aware that patient has not been compliant with his treatment. picc line was to come out today. We will keep it in until they return the call with a plan. Patient will be in specials tomorrow for an paracentesis.

## 2018-06-25 NOTE — Telephone Encounter (Signed)
I called [facility] and talked with pt to inform him his paracentesis is 12pm 06/25/2018  And t check in @11 :30

## 2018-06-26 ENCOUNTER — Encounter: Attending: Internal Medicine | Primary: Family

## 2018-06-26 ENCOUNTER — Encounter: Attending: Vascular & Interventional Radiology | Primary: Family

## 2018-06-26 NOTE — Other (Signed)
Patient did not show for appointment today for his paracentisis. ID office did not return call regarding plan for this patient.

## 2018-06-27 ENCOUNTER — Inpatient Hospital Stay: Payer: MEDICARE | Primary: Family

## 2018-07-07 ENCOUNTER — Encounter: Attending: Vascular & Interventional Radiology | Primary: Family

## 2018-07-31 NOTE — Telephone Encounter (Signed)
Pt would like to have a paracentesis scheduled

## 2018-08-01 ENCOUNTER — Telehealth: Admit: 2018-08-01 | Discharge: 2018-08-05 | Attending: Nurse Practitioner | Primary: Family

## 2018-08-01 DIAGNOSIS — K7031 Alcoholic cirrhosis of liver with ascites: Secondary | ICD-10-CM

## 2018-08-01 NOTE — Telephone Encounter (Signed)
Scheduled for today @1 

## 2018-08-01 NOTE — Progress Notes (Addendum)
08/01/2018    TELEHEALTH EVALUATION -- Audio/Visual (During COVID-19 public health emergency)    Due to COVID 19 outbreak, patient's office visit was converted to a virtual visit.  Patient was contacted and agreed to proceed with a virtual visit via [email or web address]  The risks and benefits of converting to a virtual visit were discussed in light of the current infectious disease epidemic.  Patient also understood that insurance coverage and co-pays are up to their individual insurance plans.    HPI:    Travis Ortega (DOB:  1970/04/27) has requested an audio/video evaluation for the following concern(s):  H/O alcoholic cirrhosis with ascites.   He complains of abdominal distention, discomfort and bloating. Nausea. Short of breath with exertion. LE and scrotal edema. Ran out of aldactone today and only a few days left of Lasix.   States no ETOH use since 2006.   one month ago  Took off 10 liters    Last paracentesis was in February 2020 for 5500 cc while hospitalized at [facility].   Has since had another paracentesis at [facility] in March and states they drained off about 10 liters.   States he is compliant with NAS diet and medications.     Review of Systems   Constitutional: Negative for chills, fatigue and fever.   HENT: Negative.    Eyes: Negative.    Respiratory: Positive for shortness of breath (exertional).    Cardiovascular: Positive for leg swelling.   Gastrointestinal: Positive for abdominal distention, abdominal pain and nausea.   Endocrine: Negative.    Genitourinary: Positive for scrotal swelling.   Musculoskeletal: Negative.    Neurological: Negative.    Hematological: Negative.    Psychiatric/Behavioral: Negative.        Prior to Visit Medications    Medication Sig Taking? Authorizing Provider   furosemide (LASIX) 40 MG tablet Take 1 tablet by mouth daily  Lowry Bowl, DO   ferrous sulfate 325 (65 Fe) MG tablet Take 1 tablet by mouth daily (with breakfast)  Lowry Bowl, DO   omeprazole (PRILOSEC) 20 MG delayed  release capsule Take 2 capsules by mouth every morning (before breakfast)  Lowry Bowl, DO   lidocaine 4 % external patch Place 3 patches onto the skin daily  Earna Coder, MD   lactulose encephalopathy 10 GM/15ML SOLN solution Take 20 g by mouth 3 times daily Indications: pt unsure of dose  Historical Provider, MD   ipratropium-albuterol (DUONEB) 0.5-2.5 (3) MG/3ML SOLN nebulizer solution Inhale 1 vial into the lungs every 6 hours as needed for Shortness of Breath  Historical Provider, MD   melatonin 3 MG TABS tablet Take 6 mg by mouth daily  Historical Provider, MD   spironolactone (ALDACTONE) 100 MG tablet Take 100 mg by mouth daily  Historical Provider, MD   triamcinolone (KENALOG) 0.025 % cream APP EXT AA BID  Historical Provider, MD   polyethylene glycol (GLYCOLAX) powder take 17GM (DISSOLVED IN WATER) by mouth once daily  Historical Provider, MD   ondansetron (ZOFRAN) 4 MG tablet Take 4 mg by mouth Daily   Historical Provider, MD   tenofovir (VIREAD) 300 MG tablet TK 1 T PO Daily  Historical Provider, MD       Social History     Tobacco Use   . Smoking status: Current Every Day Smoker     Packs/day: 0.50     Types: Cigarettes   . Smokeless tobacco: Never Used   Substance Use Topics   . Alcohol  use: No   . Drug use: No          PHYSICAL EXAMINATION:  [ INSTRUCTIONS:  "[x] " Indicates a positive item  "[] " Indicates a negative item  -- DELETE ALL ITEMS NOT EXAMINED]  [x]  Alert  [x]  Oriented to person/place/time    [x]  No apparent distress  []  Toxic appearing    []  Face flushed appearing [x]  Sclera clear  []  Lips are cyanotic      [x]  Breathing appears normal  []  Appears tachypneic      []  Rash on visible skin    [x]  Cranial Nerves II-XII grossly intact    []  Motor grossly intact in visible upper extremities    []  Motor grossly intact in visible lower extremities    [x]  Normal Mood  []  Anxious appearing    []  Depressed appearing  []  Confused appearing      []  Poor short term memory  []  Poor long term  memory    []  OTHER:      Due to this being a TeleHealth encounter, evaluation of the following organ systems is limited: Vitals/Constitutional/EENT/Resp/CV/GI/GU/MS/Neuro/Skin/Heme-Lymph-Imm.    ASSESSMENT:     Alcoholic Cirrhosis with recurrent Ascites.  H/O ETOH abuse    PLAN:  1. US Guided therapeutic paracentesis Medications and labs reviewed. Again reviewed procedure with risks including infection and bleeding though risk is minimal.   2. Instructed to not wait so long between taps with risks of taking off large amounts of fluid. He may need QOW or weekly para's.   3. Encourage LE compression stockings 20-30 mmhg daily during day and off Qhs and elevate LE's as often as possible to help reduce LE edema and lower risks of LE DVT.   4. Refer to Hepatology for established visit.      An  electronic signature was used to authenticate this note.    --Jodi Geralds, APRN - CNP on 08/01/2018 at 1:40 PM        Pursuant to the emergency declaration under the [facility] and the [employer], [employer] waiver authority and the [employer] and [employer] Act, this Virtual  Visit was conducted, with patient's consent, to reduce the patient's risk of exposure to COVID-19 and provide continuity of care for an established patient. Provider location at office [facility] [address].     Services were provided through a video synchronous discussion virtually to substitute for in-person clinic visit. Provider location in office in [facility]. Length of time for VV 23 minutes.

## 2018-08-01 NOTE — Telephone Encounter (Signed)
Pt is very uncomfortable.  Can I set up vv for same day appointment.  Today???

## 2018-08-01 NOTE — Telephone Encounter (Signed)
Please set a VV with me first as patient has not been following with GI/Hepatology and this needs evaluated. I also need to review his medications with him as well as physical exam since he has not followed with anyone it appears since his last hospitalization.  If he is very uncomfortable, please make VV as soon as possible. Thank you.

## 2018-08-01 NOTE — Telephone Encounter (Signed)
Pt aware paracentesis set up for Monday 04/20@9am 

## 2018-08-01 NOTE — Telephone Encounter (Signed)
Absolutely.  Please set up for VV today with plan for paracentesis this coming Monday. Thank you.

## 2018-08-04 ENCOUNTER — Inpatient Hospital Stay
Admission: EM | Admit: 2018-08-04 | Discharge: 2018-08-07 | Disposition: A | Discharge status: Other Acute Inpatient Hospital | Payer: MEDICARE | Source: Other Acute Inpatient Hospital | Admitting: Internal Medicine

## 2018-08-04 ENCOUNTER — Inpatient Hospital Stay: Admit: 2018-08-04 | Discharge: 2018-08-04 | Attending: Vascular & Interventional Radiology | Primary: Family

## 2018-08-04 ENCOUNTER — Emergency Department: Admit: 2018-08-04 | Payer: MEDICARE | Primary: Family

## 2018-08-04 DIAGNOSIS — K2211 Ulcer of esophagus with bleeding: Secondary | ICD-10-CM

## 2018-08-04 DIAGNOSIS — K7031 Alcoholic cirrhosis of liver with ascites: Secondary | ICD-10-CM

## 2018-08-04 LAB — CBC WITH AUTO DIFFERENTIAL
Basophils %: 0.7 %
Basophils Absolute: 0 10*3/uL (ref 0.0–0.2)
Eosinophils %: 1.2 %
Eosinophils Absolute: 0.1 10*3/uL (ref 0.0–0.7)
Hematocrit: 11.5 % — CL (ref 42.0–52.0)
Hemoglobin: 3.8 g/dL — CL (ref 14.0–18.0)
Lymphocytes %: 22.8 %
Lymphocytes Absolute: 1.7 10*3/uL (ref 1.0–4.8)
MCH: 22.2 pg — ABNORMAL LOW (ref 27.0–31.3)
MCHC: 32.7 % — ABNORMAL LOW (ref 33.0–37.0)
MCV: 68 fL — ABNORMAL LOW (ref 80.0–100.0)
Monocytes %: 12.6 %
Monocytes Absolute: 0.9 10*3/uL — ABNORMAL HIGH (ref 0.2–0.8)
Neutrophils %: 62.7 %
Neutrophils Absolute: 4.6 10*3/uL (ref 1.4–6.5)
Platelets: 131 10*3/uL (ref 130–400)
RBC: 1.7 M/uL — ABNORMAL LOW (ref 4.70–6.10)
RDW: 23.6 % — ABNORMAL HIGH (ref 11.5–14.5)
WBC: 7.3 10*3/uL (ref 4.8–10.8)

## 2018-08-04 LAB — TYPE AND SCREEN
ABO/Rh: AB NEG
Antibody Screen: NEGATIVE

## 2018-08-04 LAB — COMPREHENSIVE METABOLIC PANEL
ALT: 116 U/L — ABNORMAL HIGH (ref 0–41)
AST: 231 U/L — ABNORMAL HIGH (ref 0–40)
Albumin: 2.9 g/dL — ABNORMAL LOW (ref 3.5–4.6)
Alkaline Phosphatase: 250 U/L — ABNORMAL HIGH (ref 35–104)
Anion Gap: 12 mEq/L (ref 9–15)
BUN: 75 mg/dL (ref 6–20)
CO2: 23 mEq/L (ref 20–31)
Calcium: 8.1 mg/dL — ABNORMAL LOW (ref 8.5–9.9)
Chloride: 84 mEq/L — ABNORMAL LOW (ref 95–107)
Creatinine: 1.73 mg/dL — ABNORMAL HIGH (ref 0.70–1.20)
GFR African American: 51.8 — ABNORMAL LOW (ref >60)
GFR Non-African American: 42.8 — ABNORMAL LOW (ref >60)
Globulin: 3.2 g/dL (ref 2.3–3.5)
Glucose: 107 mg/dL — ABNORMAL HIGH (ref 70–99)
Potassium: 4.8 mEq/L (ref 3.4–4.9)
Sodium: 119 mEq/L — CL (ref 135–144)
Total Bilirubin: 2.2 mg/dL — ABNORMAL HIGH (ref 0.2–0.7)
Total Protein: 6.1 g/dL — ABNORMAL LOW (ref 6.3–8.0)

## 2018-08-04 LAB — EKG 12-LEAD
Atrial Rate: 81 {beats}/min
P Axis: 75 degrees
P-R Interval: 180 ms
Q-T Interval: 428 ms
QRS Duration: 104 ms
QTc Calculation (Bazett): 497 ms
R Axis: 46 degrees
T Axis: 46 degrees
Ventricular Rate: 81 {beats}/min

## 2018-08-04 LAB — PROTIME-INR
INR: 1.7
Protime: 20.3 s — ABNORMAL HIGH (ref 12.3–14.9)

## 2018-08-04 LAB — LACTIC ACID: Lactic Acid: 1.9 mmol/L (ref 0.5–2.2)

## 2018-08-04 LAB — MAGNESIUM: Magnesium: 2.5 mg/dL — ABNORMAL HIGH (ref 1.7–2.4)

## 2018-08-04 LAB — AMMONIA: Ammonia: 48 umol/L (ref 16–60)

## 2018-08-04 LAB — OSMOLALITY: Osmolality: 280 mOsm/kg (ref 280–303)

## 2018-08-04 MED ORDER — PANTOPRAZOLE SODIUM 40 MG IV SOLR
40 MG | Freq: Once | INTRAVENOUS | Status: AC
Start: 2018-08-04 — End: 2018-08-04
  Administered 2018-08-04: 18:00:00 80 mg via INTRAVENOUS

## 2018-08-04 MED ORDER — SODIUM CHLORIDE 0.9 % IV SOLN
0.9 % | INTRAVENOUS | Status: DC
Start: 2018-08-04 — End: 2018-08-06
  Administered 2018-08-04 – 2018-08-07 (×6): 50 ug/h via INTRAVENOUS

## 2018-08-04 MED ORDER — PROMETHAZINE HCL 12.5 MG PO TABS
12.5 MG | Freq: Four times a day (QID) | ORAL | Status: DC | PRN
Start: 2018-08-04 — End: 2018-08-06

## 2018-08-04 MED ORDER — SODIUM CHLORIDE 0.9 % IV BOLUS
0.9 % | Freq: Once | INTRAVENOUS | Status: DC
Start: 2018-08-04 — End: 2018-08-06

## 2018-08-04 MED ORDER — SODIUM CHLORIDE 0.9 % IV SOLN
0.9 % | INTRAVENOUS | Status: AC
Start: 2018-08-04 — End: 2018-08-05
  Administered 2018-08-04: 19:00:00 via INTRAVENOUS

## 2018-08-04 MED ORDER — ACETAMINOPHEN 325 MG PO TABS
325 MG | Freq: Four times a day (QID) | ORAL | Status: DC | PRN
Start: 2018-08-04 — End: 2018-08-04

## 2018-08-04 MED ORDER — SODIUM CHLORIDE 0.9 % IV SOLN
0.9 % | INTRAVENOUS | Status: DC
Start: 2018-08-04 — End: 2018-08-04
  Administered 2018-08-04: 18:00:00 8 mg/h via INTRAVENOUS

## 2018-08-04 MED ORDER — POLYETHYLENE GLYCOL 3350 17 G PO PACK
17 g | Freq: Every day | ORAL | Status: DC | PRN
Start: 2018-08-04 — End: 2018-08-06

## 2018-08-04 MED ORDER — CIPROFLOXACIN IN D5W 400 MG/200ML IV SOLN
400200 MG/200ML | Freq: Two times a day (BID) | INTRAVENOUS | Status: DC
Start: 2018-08-04 — End: 2018-08-06
  Administered 2018-08-04 – 2018-08-06 (×5): 400 mg via INTRAVENOUS

## 2018-08-04 MED ORDER — SODIUM CHLORIDE 0.9 % IV BOLUS
0.9 % | Freq: Once | INTRAVENOUS | Status: AC
Start: 2018-08-04 — End: 2018-08-04
  Administered 2018-08-04: 20:00:00 20 mL via INTRAVENOUS

## 2018-08-04 MED ORDER — OCTREOTIDE ACETATE 50 MCG/ML IJ SOLN
50 MCG/ML | Freq: Once | INTRAMUSCULAR | Status: AC
Start: 2018-08-04 — End: 2018-08-04
  Administered 2018-08-04: 18:00:00 50 ug via INTRAVENOUS

## 2018-08-04 MED ORDER — ACETAMINOPHEN 650 MG RE SUPP
650 MG | Freq: Four times a day (QID) | RECTAL | Status: DC | PRN
Start: 2018-08-04 — End: 2018-08-04

## 2018-08-04 MED ORDER — ALBUMIN HUMAN 25 % IV SOLN
25 % | Freq: Three times a day (TID) | INTRAVENOUS | Status: DC
Start: 2018-08-04 — End: 2018-08-06
  Administered 2018-08-04 – 2018-08-06 (×6): 25 g via INTRAVENOUS

## 2018-08-04 MED ORDER — NORMAL SALINE FLUSH 0.9 % IV SOLN
0.9 | Freq: Two times a day (BID) | INTRAVENOUS | Status: DC
Start: 2018-08-04 — End: 2018-08-06
  Administered 2018-08-05 (×2): 10 mL via INTRAVENOUS

## 2018-08-04 MED ORDER — SODIUM CHLORIDE 0.9 % IV BOLUS
0.9 | Freq: Once | INTRAVENOUS | Status: AC
Start: 2018-08-04 — End: 2018-08-04
  Administered 2018-08-04: 17:00:00 1000 mL via INTRAVENOUS

## 2018-08-04 MED ORDER — PANTOPRAZOLE SODIUM 40 MG IV SOLR
40 MG | Freq: Two times a day (BID) | INTRAVENOUS | Status: DC
Start: 2018-08-04 — End: 2018-08-04

## 2018-08-04 MED ORDER — SODIUM CHLORIDE 0.9 % IV BOLUS
0.9 % | Freq: Once | INTRAVENOUS | Status: AC
Start: 2018-08-04 — End: 2018-08-04
  Administered 2018-08-04: 18:00:00 20 mL via INTRAVENOUS

## 2018-08-04 MED ORDER — OXYCODONE HCL 5 MG PO TABS
5 MG | ORAL | Status: DC | PRN
Start: 2018-08-04 — End: 2018-08-06
  Administered 2018-08-04 – 2018-08-06 (×9): 5 mg via ORAL

## 2018-08-04 MED ORDER — PANTOPRAZOLE SODIUM 40 MG IV SOLR
40 MG | Freq: Two times a day (BID) | INTRAVENOUS | Status: DC
Start: 2018-08-04 — End: 2018-08-06
  Administered 2018-08-05 – 2018-08-07 (×5): 40 mg via INTRAVENOUS

## 2018-08-04 MED ORDER — SODIUM CHLORIDE (PF) 0.9 % IJ SOLN
0.9 % | Freq: Every day | INTRAMUSCULAR | Status: DC
Start: 2018-08-04 — End: 2018-08-06
  Administered 2018-08-05 – 2018-08-06 (×3): 10 mL via INTRAVENOUS

## 2018-08-04 MED ORDER — NICOTINE 7 MG/24HR TD PT24
7 MG/24HR | Freq: Every day | TRANSDERMAL | Status: DC
Start: 2018-08-04 — End: 2018-08-06
  Administered 2018-08-04 – 2018-08-06 (×3): 1 via TRANSDERMAL

## 2018-08-04 MED ORDER — ALBUMIN HUMAN 25 % IV SOLN
25 | INTRAVENOUS | Status: AC
Start: 2018-08-04 — End: 2018-08-04

## 2018-08-04 MED ORDER — SODIUM CHLORIDE (PF) 0.9 % IJ SOLN
0.9 % | Freq: Every day | INTRAMUSCULAR | Status: DC
Start: 2018-08-04 — End: 2018-08-04

## 2018-08-04 MED ORDER — ONDANSETRON HCL 4 MG/2ML IJ SOLN
4 MG/2ML | Freq: Four times a day (QID) | INTRAMUSCULAR | Status: DC | PRN
Start: 2018-08-04 — End: 2018-08-06

## 2018-08-04 MED ORDER — ALBUMIN HUMAN 25 % IV SOLN
25 % | INTRAVENOUS | Status: AC | PRN
Start: 2018-08-04 — End: 2018-08-04
  Administered 2018-08-04: 16:00:00 50 via INTRAVENOUS

## 2018-08-04 MED ORDER — LIDOCAINE HCL 2 % IJ SOLN
2 % | Freq: Once | INTRAMUSCULAR | Status: AC | PRN
Start: 2018-08-04 — End: 2018-08-04
  Administered 2018-08-04: 14:00:00 8 via INTRADERMAL

## 2018-08-04 MED ORDER — NORMAL SALINE FLUSH 0.9 % IV SOLN
0.9 % | INTRAVENOUS | Status: DC | PRN
Start: 2018-08-04 — End: 2018-08-06

## 2018-08-04 MED FILL — ALBUTEIN 25 % IV SOLN: 25 % | INTRAVENOUS | Qty: 100

## 2018-08-04 MED FILL — SODIUM CHLORIDE 0.9 % IV SOLN: 0.9 % | INTRAVENOUS | Qty: 250

## 2018-08-04 MED FILL — MONOJECT FLUSH SYRINGE 0.9 % IV SOLN: 0.9 % | INTRAVENOUS | Qty: 10

## 2018-08-04 MED FILL — OCTREOTIDE ACETATE 50 MCG/ML IJ SOLN: 50 MCG/ML | INTRAMUSCULAR | Qty: 1

## 2018-08-04 MED FILL — SODIUM CHLORIDE (PF) 0.9 % IJ SOLN: 0.9 % | INTRAMUSCULAR | Qty: 10

## 2018-08-04 MED FILL — PROTONIX 40 MG IV SOLR: 40 MG | INTRAVENOUS | Qty: 80

## 2018-08-04 MED FILL — NICOTINE 7 MG/24HR TD PT24: 7 MG/24HR | TRANSDERMAL | Qty: 1

## 2018-08-04 MED FILL — ALBUTEIN 25 % IV SOLN: 25 % | INTRAVENOUS | Qty: 200

## 2018-08-04 MED FILL — CIPROFLOXACIN IN D5W 400 MG/200ML IV SOLN: 400 MG/200ML | INTRAVENOUS | Qty: 200

## 2018-08-04 MED FILL — SODIUM CHLORIDE 0.9 % IV SOLN: 0.9 % | INTRAVENOUS | Qty: 1000

## 2018-08-04 MED FILL — OCTREOTIDE ACETATE 500 MCG/ML IJ SOLN: 500 MCG/ML | INTRAMUSCULAR | Qty: 1

## 2018-08-04 MED FILL — OXYCODONE HCL 5 MG PO TABS: 5 MG | ORAL | Qty: 1

## 2018-08-04 NOTE — ED Notes (Signed)
Attempted blood draw twice without success.      Farrel Gobble, RN  08/04/18 1250

## 2018-08-04 NOTE — ED Notes (Signed)
Dr Oneita Hurt in room to perform rectal exam.  Stool positive for occult blood.      Farrel Gobble, RN  08/04/18 1330

## 2018-08-04 NOTE — ED Notes (Signed)
Spoke with sister Clydie Braun, [phone], and updated her on patients condition.      Farrel Gobble, RN  08/04/18 1510

## 2018-08-04 NOTE — H&P (Addendum)
Hospital Medicine History & Physical      PCP: Yaakov Guthrie, APRN - CNP    Date of Admission: 08/04/2018    Date of Service: 08/04/18      Chief Complaint:  fatigue      History Of Present Illness:  48 y.o. male who presented to [facility] ED after undergoing a paracentesis at [facility] specials per Dr. Floyde Parkins. The patient has 12,000 cc's of acitic fluid removed. The patient appeared ill and his blood pressure was in the low 90's systolic. The patient has a known history of alcoholic cirrhosis of the liver with ascites. The patient is lethargic, but does awaken to verbal stimuli. The patient was seen at [facility] three weeks ago and left AMA. The patient has obvious abdominal distention with swelling of the bilateral lower extremities. He appears pale. Abdomin is distended and firm with hypoactive bowel sounds. He denies cp sob ha rash anx n v d f    Past Medical History:          Diagnosis Date   ??? Cirrhosis (HCC)    ??? Hepatitis B    ??? Hepatitis C        Past Surgical History:          Procedure Laterality Date   ??? HERNIA REPAIR     ??? HERNIA REPAIR  07/09/2016   ??? KNEE SURGERY Right     arthroscopy    ??? PARACENTESIS Left 05/28/2018    total of 11,200 cc removed per Dr Floyde Parkins   ??? PARACENTESIS Left 06/04/2018    Total of per Dr. Floyde Parkins   ??? PARACENTESIS Left 06/09/2018    9350cc removed by Dr Ranelle Oyster [phone]   ??? PARACENTESIS Left 06/12/2018    Dr Floyde Parkins 5050 ml of fluid removed.   ??? PARACENTESIS Left 08/04/2018    12,400 ml of fluid off per Dr Floyde Parkins   ??? PR LAP, INCISIONAL HERNIA REPAIR,REDUCIBLE N/A 07/09/2016    LAPAROSCOPIC INCISIONAL HERNIA REPAIR WITH MESH. POSS OPEN. performed by Loreli Slot, MD at [facility] OR       Medications Prior to Admission:      Prior to Admission medications    Medication Sig Start Date End Date Taking? Authorizing Provider   oxyCODONE (ROXICODONE) 5 MG immediate release tablet Take 10 mg by mouth every 4 hours as needed for Pain.    Historical Provider, MD   furosemide  (LASIX) 40 MG tablet Take 1 tablet by mouth daily 06/16/18   Lowry Bowl, DO   ferrous sulfate 325 (65 Fe) MG tablet Take 1 tablet by mouth daily (with breakfast) 06/16/18   Lowry Bowl, DO   omeprazole (PRILOSEC) 20 MG delayed release capsule Take 2 capsules by mouth every morning (before breakfast) 06/15/18   Lowry Bowl, DO   lidocaine 4 % external patch Place 3 patches onto the skin daily 06/16/18   Earna Coder, MD   lactulose encephalopathy 10 GM/15ML SOLN solution Take 20 g by mouth 3 times daily Indications: pt unsure of dose    Historical Provider, MD   ipratropium-albuterol (DUONEB) 0.5-2.5 (3) MG/3ML SOLN nebulizer solution Inhale 1 vial into the lungs every 6 hours as needed for Shortness of Breath    Historical Provider, MD   melatonin 3 MG TABS tablet Take 6 mg by mouth daily    Historical Provider, MD   spironolactone (ALDACTONE) 100 MG tablet Take 100 mg by mouth daily    Historical Provider,  MD   triamcinolone (KENALOG) 0.025 % cream APP EXT AA BID 06/03/16   Historical Provider, MD   polyethylene glycol (GLYCOLAX) powder take 17GM (DISSOLVED IN WATER) by mouth once daily 06/29/16   Historical Provider, MD   ondansetron (ZOFRAN) 4 MG tablet Take 4 mg by mouth Daily  06/16/16   Historical Provider, MD   tenofovir (VIREAD) 300 MG tablet TK 1 T PO Daily 06/04/16   Historical Provider, MD       Allergies:  Nubain [nalbuphine]; Morphine; Penicillin g; and Tylenol [acetaminophen]    Social History:      The patient currently lives home    TOBACCO:   reports that he has been smoking cigarettes. He has been smoking about 0.50 packs per day. He has never used smokeless tobacco.  ETOH:   reports no history of alcohol use.      Family History:    Positive as follows:        Problem Relation Age of Onset   ??? Cancer Mother        REVIEW OF SYSTEMS:   Pertinent positives as noted in the HPI. All other systems reviewed and negative.    PHYSICAL EXAM:    BP (!) 106/55    Pulse 78    Temp 97.5 ??F (36.4 ??C) (Oral)     Resp 18    Wt 240 lb (108.9 kg)    SpO2 100%    BMI 31.66 kg/m??     General appearance:  No apparent distress, appears pale and ill  HEENT:  Normal cephalic, atraumatic without obvious deformity. Pupils equal, round, and reactive to light.  Extra ocular muscles intact. Conjunctivae/corneas clear.  Neck: Supple, with full range of motion. No jugular venous distention. Trachea midline.  Respiratory:  Normal respiratory effort. Clear to auscultation, bilaterally without Rales/Wheezes/Rhonchi.  Cardiovascular:  Regular rate and rhythm with normal S1/S2 without murmurs, rubs or gallops.  Abdomen: firm, tender, distended with hypoactive bowel sounds.  Musculoskeletal:  Positive lower edema bilaterally.  Full range of motion without deformity.  Skin: Skin color, texture, turgor normal.  No rashes or lesions.  Neurologic:  Neurovascularly intact without any focal sensory/motor deficits. Cranial nerves: II-XII intact, grossly non-focal.  Psychiatric:  Alert and oriented, thought content appropriate, normal insight  Capillary Refill: Brisk,< 3 seconds   Peripheral Pulses: +2 palpable, equal bilaterally       Labs:     Recent Labs     08/04/18  1230   WBC 7.3   HGB 3.8*   HCT 11.5*   PLT 131     Recent Labs     08/04/18  1230   NA 119*   K 4.8   CL 84*   CO2 23   BUN 75*   CREATININE 1.73*   CALCIUM 8.1*     Recent Labs     08/04/18  1230   AST 231*   ALT 116*   BILITOT 2.2*   ALKPHOS 250*     Recent Labs     08/04/18  1230   INR 1.7     No results for input(s): CKTOTAL, TROPONINI in the last 72 hours.    Urinalysis:      Lab Results   Component Value Date    NITRU Negative 06/10/2018    WBCUA 0-2 07/10/2015    BACTERIA None 07/10/2015    RBCUA 3-5 07/10/2015    BLOODU Negative 06/10/2018    SPECGRAV 1.016 06/10/2018    GLUCOSEU Negative  06/10/2018       Radiology:     CXR: I have reviewed the CXR with the following interpretation: pending  EKG:  I have reviewed the EKG with the following interpretation: pending    XR CHEST  PORTABLE   Final Result   NO ACUTE CARDIOPULMONARY DISEASE.          ASSESSMENT:    Active Hospital Problems    Diagnosis Date Noted   ??? Acute blood loss anemia [D62] 08/04/2018       PLAN:        DVT Prophylaxis: scd  Diet: No diet orders on file  Code Status: Prior    PT/OT Eval Status: eval and tx    1. Acute blood loss anemia-will admit to ICU and consult GI. Will medicate with prbc's and monitor  2. Gastrointestinal hemorrhage with melena  3. AKI-will consult nephrology  4. Acute on chronic anemia  5. Hypotension due to hypovolemia-will monitor after blood transfusion  6. Alcoholic cirrhosis of the liver with ascites  7. Hyponatremia-nephology and monitor  8. hypovolemia     Maceo ProJohn Michael Dobritch, PA    Thank you Yaakov Guthriehristopher M Broderick, APRN - CNP for the opportunity to be involved in this patient's care. If you have any questions or concerns please feel free to contact me.       Attending Note:  I evaluated the patient, performed a physical exam, reviewed all the data, and spent >30 minutes in this encounter. I reviewed the mid-level provider's documentation and agree with the above findings.     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 He admits to several days of dark red emesis and stool.     1. Anemia: suspect from upper GI blood loss (ddx PUD, gastritis, GAVE, oozing variceal bleed, esophagitis)  - transfuse to Hb>7  - 2 large bore PIV  - IVF  - octreotide gtt + IV PPI  - empiric cipro (Pen allergy). R/o SBP / infxn- diagnostic para if unable to add-on labs for ascitic fluid. BCx.  - GI consulted. NPO    2. AKI due to above  - urine studies. Nephrology consult    3. Hyponatremia suspect hypervolemic due to cirrhosis  - with AKI / possibility of active blood loss will hold diuretics    4. Metabolic encephalopathy: tx above.  Eventually resume lactulose   - UDS     5. 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 BCx pending    6. Chronic Pain: pain management consult    CC time 40min    Mat CarneNeal Yazaira Speas, DO

## 2018-08-04 NOTE — Consults (Signed)
Inpatient consult to GI  Consult performed by: Thamas Jaegers, MD  Consult ordered by: Maceo Pro, PA          Patient Name: Travis Ortega  Admit Date: 08/04/2018 12:24 PM  MR #: [id number]  DOB: 05/14/1970    Attending Physician: Lowry Bowl, DO  Reason for consult: anemia    History of Presenting Illness:      Travis Ortega is a 48 y.o. male on hospital day 0 with a history of cirrhosis, HBV ? 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 Pshx hernia repair arthroscopic knee surgery, and recurrent paracentesis.  Family history is negative for GI malignancies or IBD.  Social history current daily nicotine use, denies current EtOH use, former use.  Denies illicit drug use.  history Obtained From:  patient, electronic medical record  GI consult for anemia-patient presented to the emergency department with generalized weakness, lethargy after having undergone a paracentesis today for 12 L of ascitic fluid removal, on arrival to the emergency department he was noted to have a hemoglobin of 3, patient's baseline hemoglobin is around 7-8.  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  He also states that he has been out of all of his medications for approximately 3 weeks and has not been taking them as directed.  He does not regularly see a hepatologist but has been  followed by Dr Mayo Ao , he is unsure of the last time he had an EGD, he thinks this was greater than 2 to 3 years,  however he does report that he has a history of esophageal varices.  He denies abdominal pain but reports several episodes of coffee-ground emesis over the last week and intermittent melanotic stools over the past week.  He is currently receiving packed red blood cells and is scheduled to receive 3 units total, he was started on octreotide infusion  and Protonix 40 mg IV twice daily.     History:      Past Medical History:   Diagnosis Date   ??? Cirrhosis (HCC)    ??? Hepatitis B    ??? Hepatitis C      Past Surgical History:   Procedure Laterality Date   ??? HERNIA REPAIR     ??? HERNIA REPAIR  07/09/2016   ??? KNEE SURGERY Right     arthroscopy    ??? PARACENTESIS Left 05/28/2018    total of 11,200 cc removed per Dr Floyde Parkins   ??? PARACENTESIS Left 06/04/2018    Total of per Dr. Floyde Parkins   ??? PARACENTESIS Left 06/09/2018    9350cc removed by Dr Ranelle Oyster [phone]   ??? PARACENTESIS Left 06/12/2018    Dr Floyde Parkins 5050 ml of fluid removed.   ??? PARACENTESIS Left 08/04/2018    12,400 ml of fluid off per Dr Floyde Parkins   ??? PR LAP, INCISIONAL HERNIA REPAIR,REDUCIBLE N/A 07/09/2016    LAPAROSCOPIC INCISIONAL HERNIA REPAIR WITH MESH. POSS OPEN. performed by Loreli Slot, MD at [facility] OR     Family History  Family History   Problem Relation Age of Onset   ??? Cancer Mother       Unable to obtain due to ventilated and/ or neurologic status  Social History     Socioeconomic History   ??? Marital status: Single     Spouse name: Not  on file   ??? Number of children: Not on file   ??? Years of education: Not on file   ??? Highest education level: Not on file   Occupational History   ??? Not on file   Social Needs   ??? Financial resource strain: Not on file   ??? Food insecurity     Worry: Not on file     Inability: Not on file   ??? Transportation needs     Medical: Not on file     Non-medical: Not on file   Tobacco Use   ??? Smoking status: Current Every Day Smoker     Packs/day: 0.50     Types: Cigarettes   ??? Smokeless tobacco: Never Used   Substance and Sexual Activity   ??? Alcohol use: No   ??? Drug use: No   ??? Sexual activity: Not on file   Lifestyle   ??? Physical activity     Days per week: Not on file     Minutes per session: Not on file   ??? Stress: Not on file   Relationships   ??? Social Wellsite geologist on phone: Not on file     Gets together: Not on file     Attends religious service: Not on file      Active member of club or organization: Not on file     Attends meetings of clubs or organizations: Not on file     Relationship status: Not on file   ??? Intimate partner violence     Fear of current or ex partner: Not on file     Emotionally abused: Not on file     Physically abused: Not on file     Forced sexual activity: Not on file   Other Topics Concern   ??? Not on file   Social History Narrative   ??? Not on file       Unable to obtain due to ventilated and/ or neurologic status    Home Medications:      Medications Prior to Admission: oxyCODONE (ROXICODONE) 5 MG immediate release tablet, Take 10 mg by mouth every 4 hours as needed for Pain.  furosemide (LASIX) 40 MG tablet, Take 1 tablet by mouth daily  ferrous sulfate 325 (65 Fe) MG tablet, Take 1 tablet by mouth daily (with breakfast)  omeprazole (PRILOSEC) 20 MG delayed release capsule, Take 2 capsules by mouth every morning (before breakfast)  lidocaine 4 % external patch, Place 3 patches onto the skin daily  lactulose encephalopathy 10 GM/15ML SOLN solution, Take 20 g by mouth 3 times daily Indications: pt unsure of dose  ipratropium-albuterol (DUONEB) 0.5-2.5 (3) MG/3ML SOLN nebulizer solution, Inhale 1 vial into the lungs every 6 hours as needed for Shortness of Breath  melatonin 3 MG TABS tablet, Take 6 mg by mouth daily  spironolactone (ALDACTONE) 100 MG tablet, Take 100 mg by mouth daily  triamcinolone (KENALOG) 0.025 % cream, APP EXT AA BID  polyethylene glycol (GLYCOLAX) powder, take 17GM (DISSOLVED IN WATER) by mouth once daily  ondansetron (ZOFRAN) 4 MG tablet, Take 4 mg by mouth Daily   tenofovir (VIREAD) 300 MG tablet, TK 1 T PO Daily    Current Hospital Medications:   Scheduled Meds:  ??? sodium chloride  20 mL Intravenous Once   ??? sodium chloride  1,000 mL Intravenous Once   ??? sodium chloride flush  10 mL Intravenous 2 times per day   ??? pantoprazole  40 mg Intravenous BID    And   ??? sodium chloride (PF)  10 mL Intravenous Daily   ??? nicotine   1 patch Transdermal Daily   ??? sodium chloride  20 mL Intravenous Once   ??? ciprofloxacin  400 mg Intravenous Q12H   ??? albumin human  25 g Intravenous Q8H     Continuous Infusions:  ??? octreotide (SANDOSTATIN) infusion     ??? sodium chloride 75 mL/hr at 08/04/18 1529     PRN Meds:.sodium chloride flush, polyethylene glycol, promethazine **OR** ondansetron, oxyCODONE  ??? octreotide (SANDOSTATIN) infusion     ??? sodium chloride 75 mL/hr at 08/04/18 1529      Allergies:     Allergies   Allergen Reactions   ??? Nubain [Nalbuphine] Anaphylaxis and Hives   ??? Morphine Swelling   ??? Penicillin G Swelling     Throat swells   ??? Tylenol [Acetaminophen] Rash      Review of Systems:       [x]  CV, Resp, Neuro, GU, and all other systems reviewed and negative other than listed in HPI.  ??      Objective Findings:     Vitals:   Vitals:    08/04/18 1500 08/04/18 1530 08/04/18 1545 08/04/18 1558   BP:  (!) 103/49 (!) 133/49 (!) 103/37   Pulse: 88 81 83 75   Resp: 15 12 18 9    Temp:       TempSrc:       SpO2: 100% 100% 100% 100%   Weight:       Height:   5\' 9"  (1.753 m)         Physical Examination:  General: A/O x3, with forgetfulness and periods of confusion  HEENT: Normocephalic, no scleral icterus.  Neck: No JVD.  Heart: Regular, no murmur, no rub/gallop. No RV heave.  Lungs: Clear to ascultation, no rales/wheezing/rhonchi. Good chest wall excursion.  Abdomen: Appearance:, + Distension, Soft, mild generalized tenderness no rebound or guarding, Bowel sounds normal   Extremities: No clubbing/cyanosis, no edema.   Skin: Warm, dry, normal turgor, no rash, no bruise, no petichiae.  Neuro: No myoclonus or tremor.   Psych: Normal affect    Results/ Medications reviewed 08/04/2018, 4:04 PM     Laboratory, Microbiology, Pathology, Radiology, Cardiology, Medications and Transcriptions reviewed  Scheduled Meds:  ??? sodium chloride  20 mL Intravenous Once   ??? sodium chloride  1,000 mL Intravenous Once   ??? sodium chloride flush  10 mL Intravenous 2 times  per day   ??? pantoprazole  40 mg Intravenous BID    And   ??? sodium chloride (PF)  10 mL Intravenous Daily   ??? nicotine  1 patch Transdermal Daily   ??? sodium chloride  20 mL Intravenous Once   ??? ciprofloxacin  400 mg Intravenous Q12H   ??? albumin human  25 g Intravenous Q8H     Continuous Infusions:  ??? octreotide (SANDOSTATIN) infusion     ??? sodium chloride 75 mL/hr at 08/04/18 1529       Recent Labs     08/04/18  1230   WBC 7.3   HGB 3.8*   HCT 11.5*   MCV 68.0*   PLT 131     Recent Labs     08/04/18  1230   NA 119*   K 4.8   CL 84*   CO2 23   BUN 75*   CREATININE 1.73*     Recent Labs  08/04/18  1230   AST 231*   ALT 116*   BILITOT 2.2*   ALKPHOS 250*     No results for input(s): LIPASE, AMYLASE in the last 72 hours.  Recent Labs     08/04/18  1230   PROT 6.1*   INR 1.7     Xr Chest Portable    Result Date: 08/04/2018  EXAMINATION: XR CHEST PORTABLE CLINICAL HISTORY: SHORTNESS OF BREATH COMPARISONS: June 08, 2018 FINDINGS: Osseous structures intact. Cardiopericardial silhouette normal. Pulmonary vasculature normal. Lungs clear.     NO ACUTE CARDIOPULMONARY DISEASE.       Impression:   48 year old Caucasian male with decompensated alcoholic cirrhosis, Presented to the ED with profound anemia after paracentesis today hemoglobin is 3, baseline hemoglobin is 7-8 he is currently scheduled to receive 3 units of packed red blood cells, was started on octreotide infusion and Protonix twice daily.  Reports coffee-ground emesis and melena intermittently over the past week.  Likely an upper GI source of bleeding possibly portal hypertensive gastropathy, esophageal varices, gastric or duodenal ulcers.    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  Would need to resume tenofovir for HBV treatment.  Prior HCV antibody positive, would need to check PCR for  completeness.  Regardless decision to for HCV treatment will be based on transplant center evaluation (possibility of HCV positive donor)  2 - Grade 2 / ? refractory  Ascites:   -Underwent therapeutic paracentesis today for 12 L removed, ascitic fluid analysis is pending  - No Hx of SBP noted  -When able to take p.o. restart home medications including spironolactone and Lasix  3 - Anemia, hx of esophageal varices  -serial HH, transfuse to keep Hgb 7-8 do not overtransfuse  - Previous history of EGD @ [facility], patient reports esophageal varices, unclear timeframe  -EGD after medical optimization and pending clinical course  -NPO   -continue IV Protonix twice daily and octreotide infusion  -Agree with prophylactic antibiotic  4 - HCC screening:   Recent imaging from 05/2018 Negative for liver mass  5-  Hepatic encephalopathy:   -when able to tolerate PO start  Lactulose - titrate to 2-3 soft bm's per day.   .        Comments:     Thank you for allowing us to participate in the care of this patient.     Will continue to follow.    Please call if questions or concerns arise.  A/p  Agree regarding assessment and plan.  ObtainED information and review chart.  Patient with history of  decompensated cirrhosis.  Based on previous data, co-infection HBV HCV as well as alcohol use.  Currently alcoholic abstinent.  No recent HBV PCR.  Patient has not been taking tenofovir.  Had HCV antibody positive but no recent PCR. would need to check PCR for completeness.  Regardless decision to for HCV treatment will be based on transplant center evaluation (possibility of HCV positive donor.  Would [facility]  for acute inpatient care, we will proceed with EGD for further evaluation.  Optimize ascites.  Further recommendation to follow  Thamas JaegersHicham Juelle Dickmann MD  Please note this report has been partially produced using speech recognition software and may cause contain errors related to that system including  grammar, punctuation and spelling  as well as words and phrases that may seem inappropriate. If there are questions or concerns please feel free to contact me to clarify.

## 2018-08-04 NOTE — Other (Signed)
Discussion between myself and [facility] Radiology RN regarding patient being weak, pale and jaundiced.  I spoke to his sister, Clydie Braun, who states he does not have a PCP and is out of most of his meds.  Dr Floyde Parkins informed and decision to take patient to ER made.  Pt and sister informed.     1204 speaking to Missy ER RN, ER bed 4 is being held for him.

## 2018-08-04 NOTE — ED Triage Notes (Signed)
Patient brought over from specials following a paracentsis in which they took 12,4102ml of fluid. Patient has not been taking his home medications in about a month. Signed out AMA from [facility] three weeks ago.

## 2018-08-04 NOTE — ED Notes (Signed)
Patient to icu via cart with monitor. Tolerated well.     Travis Ortega  08/04/18 1513

## 2018-08-04 NOTE — ED Provider Notes (Signed)
[facility] ED  eMERGENCY dEPARTMENTeNCOUnter      Pt Name: Travis Ortega  MRN: [id number]  Birthdate 1970/07/30  Date ofevaluation: 08/04/2018  Provider: Jacqulyn Bath, DO    CHIEF COMPLAINT       Chief Complaint   Patient presents with   . Fatigue     Patient brought from specials for fatigue         HISTORY OF PRESENT ILLNESS   (Location/Symptom, Timing/Onset,Context/Setting, Quality, Duration, Modifying Factors, Severity)  Note limiting factors.   Travis Ortega is a 48 y.o. male who presents to the emergency department .  Patient was sent to the ER after having a paracentesis in radiology.  They took off 12,000 cc of ascitic fluid and then gave him albumin.  They felt that he did not look well and sent him to the ER.  Patient was very lethargic and weak.  Patient has not taken any of his medications for at least a month because he ran out.  He was recently in a hospital but signed out AGAINST MEDICAL ADVICE.    HPI    NursingNotes were reviewed.    REVIEW OF SYSTEMS    (2-9 systems for level 4, 10 or more for level 5)     Review of Systems   Constitutional: Positive for fatigue. Negative for activity change and appetite change.   HENT: Negative for congestion and sore throat.    Eyes: Negative for pain and visual disturbance.   Respiratory: Negative for chest tightness and shortness of breath.    Cardiovascular: Positive for leg swelling. Negative for chest pain.   Gastrointestinal: Positive for abdominal distention, abdominal pain and blood in stool. Negative for nausea and vomiting.   Endocrine: Negative for polydipsia.   Genitourinary: Negative for flank pain and urgency.   Musculoskeletal: Negative for gait problem and neck stiffness.   Skin: Negative for rash.   Neurological: Positive for weakness. Negative for light-headedness and headaches.   Psychiatric/Behavioral: Negative for confusion and sleep disturbance.       Except as noted above the remainder of the review of systems was reviewed and  negative.       PAST MEDICAL HISTORY     Past Medical History:   Diagnosis Date   . Cirrhosis (HCC)    . Hepatitis B    . Hepatitis C          SURGICALHISTORY       Past Surgical History:   Procedure Laterality Date   . HERNIA REPAIR     . HERNIA REPAIR  07/09/2016   . KNEE SURGERY Right     arthroscopy    . PARACENTESIS Left 05/28/2018    total of 11,200 cc removed per Dr Floyde Parkins   . PARACENTESIS Left 06/04/2018    Total of per Dr. Floyde Parkins   . PARACENTESIS Left 06/09/2018    9350cc removed by Dr Ranelle Oyster [phone]   . PARACENTESIS Left 06/12/2018    Dr Floyde Parkins 5050 ml of fluid removed.   Marland Kitchen PARACENTESIS Left 08/04/2018    12,400 ml of fluid off per Dr Floyde Parkins   . PR LAP, INCISIONAL HERNIA REPAIR,REDUCIBLE N/A 07/09/2016    LAPAROSCOPIC INCISIONAL HERNIA REPAIR WITH MESH. POSS OPEN. performed by Loreli Slot, MD at [facility] OR         CURRENT MEDICATIONS       Previous Medications    FERROUS SULFATE 325 (65 FE) MG TABLET    Take 1 tablet  by mouth daily (with breakfast)    FUROSEMIDE (LASIX) 40 MG TABLET    Take 1 tablet by mouth daily    IPRATROPIUM-ALBUTEROL (DUONEB) 0.5-2.5 (3) MG/3ML SOLN NEBULIZER SOLUTION    Inhale 1 vial into the lungs every 6 hours as needed for Shortness of Breath    LACTULOSE ENCEPHALOPATHY 10 GM/15ML SOLN SOLUTION    Take 20 g by mouth 3 times daily Indications: pt unsure of dose    LIDOCAINE 4 % EXTERNAL PATCH    Place 3 patches onto the skin daily    MELATONIN 3 MG TABS TABLET    Take 6 mg by mouth daily    OMEPRAZOLE (PRILOSEC) 20 MG DELAYED RELEASE CAPSULE    Take 2 capsules by mouth every morning (before breakfast)    ONDANSETRON (ZOFRAN) 4 MG TABLET    Take 4 mg by mouth Daily     OXYCODONE (ROXICODONE) 5 MG IMMEDIATE RELEASE TABLET    Take 10 mg by mouth every 4 hours as needed for Pain.    POLYETHYLENE GLYCOL (GLYCOLAX) POWDER    take 17GM (DISSOLVED IN WATER) by mouth once daily    SPIRONOLACTONE (ALDACTONE) 100 MG TABLET    Take 100 mg by mouth daily    TENOFOVIR (VIREAD)  300 MG TABLET    TK 1 T PO Daily    TRIAMCINOLONE (KENALOG) 0.025 % CREAM    APP EXT AA BID       ALLERGIES     Nubain [nalbuphine]; Morphine; Penicillin g; and Tylenol [acetaminophen]    FAMILY HISTORY       Family History   Problem Relation Age of Onset   . Cancer Mother           SOCIAL HISTORY       Social History     Socioeconomic History   . Marital status: Single     Spouse name: None   . Number of children: None   . Years of education: None   . Highest education level: None   Occupational History   . None   Social Needs   . Financial resource strain: None   . Food insecurity     Worry: None     Inability: None   . Transportation needs     Medical: None     Non-medical: None   Tobacco Use   . Smoking status: Current Every Day Smoker     Packs/day: 0.50     Types: Cigarettes   . Smokeless tobacco: Never Used   Substance and Sexual Activity   . Alcohol use: No   . Drug use: No   . Sexual activity: None   Lifestyle   . Physical activity     Days per week: None     Minutes per session: None   . Stress: None   Relationships   . Social Wellsite geologist on phone: None     Gets together: None     Attends religious service: None     Active member of club or organization: None     Attends meetings of clubs or organizations: None     Relationship status: None   . Intimate partner violence     Fear of current or ex partner: None     Emotionally abused: None     Physically abused: None     Forced sexual activity: None   Other Topics Concern   . None   Social History Narrative   .  None       SCREENINGS    Glasgow Coma Scale  Eye Opening: Spontaneous  Best Verbal Response: Oriented  Best Motor Response: Obeys commands  Glasgow Coma Scale Score: 15         PHYSICAL EXAM    (up to 7 for level 4, 8 or more for level 5)     ED Triage Vitals [08/04/18 1224]   BP Temp Temp Source Pulse Resp SpO2 Height Weight   (!) 102/59 97.2 F (36.2 C) Oral 80 18 100 % -- 240 lb (108.9 kg)       Physical Exam  Vitals signs and nursing  note reviewed.   Constitutional:       General: He is not in acute distress.     Appearance: He is well-developed. He is not diaphoretic.      Comments: Appears chronically ill   HENT:      Head: Normocephalic and atraumatic.      Right Ear: External ear normal.      Left Ear: External ear normal.      Mouth/Throat:      Pharynx: No oropharyngeal exudate.   Eyes:      Conjunctiva/sclera: Conjunctivae normal.      Pupils: Pupils are equal, round, and reactive to light.   Neck:      Musculoskeletal: Normal range of motion and neck supple.      Thyroid: No thyromegaly.      Vascular: No JVD.      Trachea: No tracheal deviation.   Cardiovascular:      Rate and Rhythm: Normal rate.      Heart sounds: Normal heart sounds. No murmur.   Pulmonary:      Effort: Pulmonary effort is normal. No respiratory distress.      Breath sounds: Normal breath sounds. No wheezing.   Abdominal:      General: Bowel sounds are normal. There is distension.      Palpations: Abdomen is soft.      Tenderness: There is abdominal tenderness. There is no guarding.   Genitourinary:     Rectum: Guaiac result positive.      Comments: Dark stool heme positive  Musculoskeletal: Normal range of motion.      Right lower leg: Edema present.      Left lower leg: Edema present.   Skin:     General: Skin is warm and dry.      Coloration: Skin is pale.      Findings: No rash.   Neurological:      Mental Status: He is alert and oriented to person, place, and time.      Cranial Nerves: No cranial nerve deficit.   Psychiatric:         Behavior: Behavior normal.         DIAGNOSTIC RESULTS     EKG: All EKG's are interpreted by the Emergency Department Physician who either signs or Co-signsthis chart in the absence of a cardiologist.    Normal sinus rhythm 81 bpm incomplete right bundle branch block.  Prolonged QT.  No acute ischemia    RADIOLOGY:   Non-plain filmimages such as CT, Ultrasound and MRI are read by the radiologist. Plain radiographic images are  visualized and preliminarily interpreted by the emergency physician with the below findings:        Interpretation per the Radiologist below, if available at the time ofthis note:    XR CHEST PORTABLE   Final Result  NO ACUTE CARDIOPULMONARY DISEASE.            ED BEDSIDE ULTRASOUND:   Performed by ED Physician - none    LABS:  Labs Reviewed   CBC WITH AUTO DIFFERENTIAL - Abnormal; Notable for the following components:       Result Value    RBC 1.70 (*)     Hemoglobin 3.8 (*)     Hematocrit 11.5 (*)     MCV 68.0 (*)     MCH 22.2 (*)     MCHC 32.7 (*)     RDW 23.6 (*)     All other components within normal limits    Narrative:     CALL  [facility] tel. [phone],  H&H results called to and read back by Anise Salvo ER RN, 08/04/2018 13:21,  by Letitia Libra   PROTIME-INR - Abnormal; Notable for the following components:    Protime 20.3 (*)     All other components within normal limits   LACTIC ACID, PLASMA   COMPREHENSIVE METABOLIC PANEL   MAGNESIUM   AMMONIA   TYPE AND SCREEN   TYPE AND SCREEN   PREPARE RBC (CROSSMATCH)   PREPARE RBC (CROSSMATCH)       All other labs were within normal range or not returned as of this dictation.    EMERGENCY DEPARTMENT COURSE and DIFFERENTIAL DIAGNOSIS/MDM:   Vitals:    Vitals:    08/04/18 1228 08/04/18 1317 08/04/18 1327 08/04/18 1330   BP: (!) 101/49 (!) 89/48  (!) 98/52   Pulse:  76 86 77   Resp:  10 18    Temp:       TempSrc:       SpO2: 100% 100% 100%    Weight:           This patient with alcoholic cirrhosis status post paracentesis today comes to the ER with hypotension and hemoglobin of 3.8.  Patient's clinical picture fits the hemoglobin level.  He is heme positive and GI bleeding.  Patient will receive packed RBCs and go to ICU.  I am contacting Dr. Richardson Chiquito  from GI and the hospitalist.    MDM      REASSESSMENT          CRITICAL CARE TIME   Total Critical Care time was 45 minutes, excluding separatelyreportable procedures.  There was a high probability ofclinically  significant/life threatening deterioration in the patient's condition which required my urgent intervention.      CONSULTS:  IP CONSULT TO GI    PROCEDURES:  Unless otherwise noted below, none     Procedures    FINAL IMPRESSION      1. Gastrointestinal hemorrhage with melena    2. Alcoholic cirrhosis of liver with ascites (HCC)    3. Anemia due to acute blood loss    4. Acute on chronic anemia    5. Hypotension due to hypovolemia    6. History of abdominal paracentesis    7. Hyponatremia    8. Hypovolemia          DISPOSITION/PLAN   DISPOSITION Decision To Admit 08/04/2018 01:31:13 PM      PATIENT REFERREDTO:  No follow-up provider specified.    DISCHARGEMEDICATIONS:  New Prescriptions    No medications on file     Controlled Substances Monitoring:     No flowsheet data found.    (Please note that portions of this note were completed with a voice recognition program.  Efforts were made  to edit the dictations but occasionally words are mis-transcribed.)    Jacqulyn Bath, DO (electronically signed)  Attending Emergency Physician          Jacqulyn Bath, DO  08/04/18 1545

## 2018-08-04 NOTE — Consults (Signed)
Pulmonary and Critical Care Medicine  Consult Note  Encounter Date: 08/04/2018 3:50 PM    [name] is a 48 y.o. male  DOB: 1970/07/31  Requesting Provider: Lowry BowlNeal R Majumdar, DO    Reason for request: Severe anemia            HISTORY OF PRESENT ILLNESS:    Patient is 48 y.o. presents with lethargy, and weakness, he had paracentesis done today, 1300 cc of fluid removed, patient report no chest pain, no shortness of breath, he does have abdominal pain, testicle swollen, and uncomfortable, he has bilateral lower extremity edema which is at baseline he is on Lasix and Aldactone at home, no fever or chills, no coughing, no hemoptysis, he has been dark melanotic stool.  He received 50 g of albumin after the thoracentesis.          Past Medical History:        Diagnosis Date   ??? Cirrhosis (HCC)    ??? Hepatitis B    ??? Hepatitis C        Past Surgical History:        Procedure Laterality Date   ??? HERNIA REPAIR     ??? HERNIA REPAIR  07/09/2016   ??? KNEE SURGERY Right     arthroscopy    ??? PARACENTESIS Left 05/28/2018    total of 11,200 cc removed per Dr Floyde ParkinsWehbe   ??? PARACENTESIS Left 06/04/2018    Total of 11550ml per Dr. Floyde ParkinsWehbe   ??? PARACENTESIS Left 06/09/2018    9350cc removed by Dr Ranelle Oysterharles Wehbe [phone]   ??? PARACENTESIS Left 06/12/2018    Dr Floyde ParkinsWehbe 5050 ml of fluid removed.   ??? PARACENTESIS Left 08/04/2018    12,400 ml of fluid off per Dr Floyde ParkinsWehbe   ??? PR LAP, INCISIONAL HERNIA REPAIR,REDUCIBLE N/A 07/09/2016    LAPAROSCOPIC INCISIONAL HERNIA REPAIR WITH MESH. POSS OPEN. performed [facility]otJoseph A Martin[facility]       Social History:     reports that he has been smoking cigarettes. He has been smoking about 0.50 packs per day. He has never used smokeless tobacco. He reports that he does not drink alcohol or use drugs.    Family History:       Problem Relation Age of Onset   ??? Cancer Mother        Allergies:  Nubain [nalbuphine]; Morphine; Penicillin g; and Tylenol [acetaminophen]        MEDICATIONS during current  hospitalization:    Continuous Infusions:  ??? octreotide (SANDOSTATIN) infusion     ??? sodium chloride 75 mL/hr at 08/04/18 1529       Scheduled Meds:  ??? sodium chloride  20 mL Intravenous Once   ??? sodium chloride  1,000 mL Intravenous Once   ??? sodium chloride flush  10 mL Intravenous 2 times per day   ??? pantoprazole  40 mg Intravenous BID    And   ??? sodium chloride (PF)  10 mL Intravenous Daily   ??? nicotine  1 patch Transdermal Daily   ??? sodium chloride  20 mL Intravenous Once   ??? ciprofloxacin  400 mg Intravenous Q12H       PRN Meds:sodium chloride flush, polyethylene glycol, promethazine **OR** ondansetron, oxyCODONE        REVIEW OF SYSTEMS:  ROS: 10 organs review of system is done including general, psychological, ENT, hematological, endocrine, respiratory, cardiovascular, gastrointestinal, musculoskeletal, neurological,  allergy and Immunology is done and is otherwise  negative.    PHYSICAL EXAM:    Vitals:  BP (!) 133/49    Pulse 83    Temp 97.5 ??F (36.4 ??C) (Oral)    Resp 18    Ht 5\' 9"  (1.753 m)    Wt 240 lb (108.9 kg)    SpO2 100%    BMI 35.44 kg/m??     General: alert, cooperative, no distress  Head: normocephalic, atraumatic  Eyes:No gross abnormalities., PERRL and Sclera nonicteric  ENT:  MMM no lesions  Neck:  supple and no masses  Chest : Good air movement, no wheezing, clear to auscultation, nontender, tympanic  Heart:: Heart sounds are normal.  Regular rate and rhythm without murmur, gallop or rub.  ABD:   Distended, diffusely tender, no guarding or rebound, testicle enlarged with hydrocele  Musculoskeletal : no cyanosis, no clubbing and 2 + edema-  bilateral lower extremities  Neuro:  Grossly normal  Skin: No rashes or nodules noted.  Lymph node:  no cervical nodes  Urology: Yes Foley   Psychiatric: appropriate        Data Review  Recent Labs     08/04/18  1230   WBC 7.3   HGB 3.8*   HCT 11.5*   PLT 131      Recent Labs     08/04/18  1230   NA 119*   K 4.8   CL 84*   CO2 23   BUN 75*   CREATININE 1.73*    GLUCOSE 107*       MV Settings:          ABGs: No results for input(s): PHART, PCO2ART, PO2ART, HCO3ART, BEART, O2SATART, TCO2ART in the last 72 hours.  O2 Device: None (Room air)  Lab Results   Component Value Date    LACTA 1.9 08/04/2018    LACTA 1.0 06/07/2018       Radiology  I personally reviewed imaging studies and chest x-ray is negative        Assessment, plan:   Patient is at risk due to    ?? Acute blood loss anemia secondary to GI bleed most probably upper bleed gastritis, versus peptic ulcer disease, variceal bleed is possible.  ?? Liver cirrhosis secondary to alcoholic liver disease along with hepatitis C and B  ?? Severe ascites secondary to above  ?? Meld score 29, mortality 20% in 3 months his, childbirth score  Class c   ?? Hypervolemic hyponatremia secondary to liver cirrhosis  ?? Acute renal failure secondary to above    Recommendation  ?? Blood transfusion target hemoglobin 7  ?? FFP 2 units  ?? Consider platelet transfusion, if required multiple units of packed RBC  ?? Continue Protonix  ?? Octreotide drip  ?? N.p.o. and GI evaluation  ?? Monitor hemoglobin  ?? Currently not on pressors, watch closely  ?? Abdomen 25 g every 8 hours  ?? Watch urine output and renal function  ?? Monitor sodium  ?? Watch volume status, resume Lasix and Aldactone when stabilized.  ?? Continue ciprofloxacin      Due to the immediate potential for life-threatening deterioration due to GI bleed with acute life-threatening blood loss anemia, I spent 42 minutes providing critical care.  This time is excluding time spent performing procedures.    Thank you for consultation    Electronically signed by Earleen Reaper, MD, FCCP,  on 08/04/2018 at 3:50 PM

## 2018-08-04 NOTE — Other (Signed)
Arrived from ED with uncrossed blood being transfused. VS obtained. Rounding completed with hospitalist and Dr. Raynald Kemp. Protonix Infusion DC'd. Plan to transfuse 2 more units. Will be receiving FFP as well.

## 2018-08-04 NOTE — Discharge Instructions (Signed)
Ultrasound Guided Paracentesis    Activity:  Rest, avoid strenuous activity such as lifting heavy objects, and bending, stretching or pulling.     Diet:  Resume usual diet    Medication:  * Resume home medication unless otherwise instructed by your physician.  * (If Applicable) If taking any blood thinners or Aspirin, speak with your physician before restarting them.  * May take mild pain reliever, as needed, such as Acetaminophen or Ibuprofen.    INSTRUCTIONS OR COMMENTS RELATIVE TO SPECIFIC EXAM PERFORMED:    - Some tenderness at site of paracentesis will be normal for a few days.  - May remove band aid after 48 hours.   - Monitor the site for the next 24 - 48 hours.  - If you experience any drainage or bleeding at the site, hold pressure for 30 minutes and lay on side opposite of the procedure site (move fluid away from the   area of leakage).  If drainage or bleeding does not stop and is large amount, call your physician or go to the ER for evaluation.   - If any signs of infection (redness, swelling, drainage/pus) at the site, go to ER for evaluation.     IF YOU DEVELOP ANY OF THE FOLLOWING SYMPTOMS, CONTACT PHYSICIAN LISTED BELOW:  Physician performing procedure: DR. Wehbe - [phone] or [phone] and ask to be put in contact with the RADIOLOGY RN. After hours contact ER.  * Extreme pain that increases after leaving the hospital  * Active bleeding (refer to above instructions)  * Chills, fever above 100 degrees Farenheit and fatigue.  * Weakness, dizziness, lightheadedness or fainting.    If you are unable to contact any of the above physician and you feel you have a major problem, please go to the Emergency Room for treatment.    TOTAL REMOVED WITH PARACENTESIS TODAY: ***

## 2018-08-04 NOTE — H&P (Signed)
HPI:  Patient is a 48 year old gentleman known to our service for alcoholic liver cirrhosis and recurrent ascites. These being managed by gastroenterology as well, on Lasix for fluid management. He presents today for distended abdomen, and has become increasingly lethargic. He has not been taking his medications recently since he ran out. She is also having study decrease of his hemoglobin and hematocrit. Additionally he has had increased leg swelling and scrotal edema.     Family History   Problem Relation Age of Onset   . Cancer Mother        Past Surgical History:   Procedure Laterality Date   . HERNIA REPAIR     . HERNIA REPAIR  07/09/2016   . KNEE SURGERY Right     arthroscopy    . PARACENTESIS Left 05/28/2018    total of 11,200 cc removed per Dr Floyde Parkins   . PARACENTESIS Left 06/04/2018    Total of per Dr. Floyde Parkins   . PARACENTESIS Left 06/09/2018    9350cc removed by Dr Ranelle Oyster [phone]   . PARACENTESIS Left 06/12/2018    Dr Floyde Parkins 5050 ml of fluid removed.   Marland Kitchen PARACENTESIS Left 08/04/2018    12,400 ml of fluid off per Dr Floyde Parkins   . PR LAP, INCISIONAL HERNIA REPAIR,REDUCIBLE N/A 07/09/2016    LAPAROSCOPIC INCISIONAL HERNIA REPAIR WITH MESH. POSS OPEN. performed by Loreli Slot, MD at [facility] OR        Past Medical History:   Diagnosis Date   . Cirrhosis (HCC)    . Hepatitis B    . Hepatitis C        Social History     Socioeconomic History   . Marital status: Single     Spouse name: None   . Number of children: None   . Years of education: None   . Highest education level: None   Occupational History   . None   Social Needs   . Financial resource strain: None   . Food insecurity     Worry: None     Inability: None   . Transportation needs     Medical: None     Non-medical: None   Tobacco Use   . Smoking status: Current Every Day Smoker     Packs/day: 0.50     Types: Cigarettes   . Smokeless tobacco: Never Used   Substance and Sexual Activity   . Alcohol use: No   . Drug use: No   . Sexual  activity: None   Lifestyle   . Physical activity     Days per week: None     Minutes per session: None   . Stress: None   Relationships   . Social Wellsite geologist on phone: None     Gets together: None     Attends religious service: None     Active member of club or organization: None     Attends meetings of clubs or organizations: None     Relationship status: None   . Intimate partner violence     Fear of current or ex partner: None     Emotionally abused: None     Physically abused: None     Forced sexual activity: None   Other Topics Concern   . None   Social History Narrative   . None       Allergies   Allergen Reactions   . Nubain [Nalbuphine] Anaphylaxis and Hives   .  Morphine Swelling   . Penicillin G Swelling     Throat swells   . Tylenol [Acetaminophen] Rash       No current facility-administered medications on file prior to encounter.      Current Outpatient Medications on File Prior to Encounter   Medication Sig Dispense Refill   . oxyCODONE (ROXICODONE) 5 MG immediate release tablet Take 10 mg by mouth every 4 hours as needed for Pain.     . furosemide (LASIX) 40 MG tablet Take 1 tablet by mouth daily 60 tablet 3   . ferrous sulfate 325 (65 Fe) MG tablet Take 1 tablet by mouth daily (with breakfast) 30 tablet 3   . omeprazole (PRILOSEC) 20 MG delayed release capsule Take 2 capsules by mouth every morning (before breakfast) 30 capsule 3   . lidocaine 4 % external patch Place 3 patches onto the skin daily 90 patch 0   . 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 ipratropium-albuterol (DUONEB) 0.5-2.5 (3) MG/3ML SOLN nebulizer solution Inhale 1 vial into the lungs every 6 hours as needed for Shortness of Breath     . melatonin 3 MG TABS tablet Take 6 mg by mouth daily     . spironolactone (ALDACTONE) 100 MG tablet Take 100 mg by mouth daily     . triamcinolone (KENALOG) 0.025 % cream APP EXT AA BID  0   . polyethylene glycol  (GLYCOLAX) powder take 17GM (DISSOLVED IN WATER) by mouth once daily  0   . ondansetron (ZOFRAN) 4 MG tablet Take 4 mg by mouth Daily      . tenofovir (VIREAD) 300 MG tablet TK 1 T PO Daily  0       Review of Systems   Constitutional: Positive for appetite change and fatigue. Negative for chills, diaphoresis and fever.   HENT: Negative.  Negative for congestion, ear pain, hearing loss and tinnitus.    Eyes: Negative.  Negative for photophobia.   Respiratory: Negative.  Negative for cough, shortness of breath and wheezing.    Cardiovascular: Positive for leg swelling. Negative for chest pain and palpitations.   Gastrointestinal: Positive for abdominal distention and abdominal pain. Negative for constipation, diarrhea, nausea and vomiting.   Genitourinary: Positive for scrotal swelling. Negative for dysuria, flank pain, frequency, hematuria and urgency.   Musculoskeletal: Negative.  Negative for back pain and neck pain.   Skin: Negative.  Negative for rash.   Allergic/Immunologic: Negative for environmental allergies.   Neurological: Negative.  Negative for dizziness, tremors, weakness and headaches.   Hematological: Does not bruise/bleed easily.   Psychiatric/Behavioral: Negative.  Negative for hallucinations and suicidal ideas. The patient is not nervous/anxious.        OBJECTIVE:  BP (!) 101/56   Pulse 80   Temp 97 F (36.1 C) (Axillary)   Resp 16   Ht 6\' 1"  (1.854 m)   Wt 242 lb (109.8 kg)   SpO2 100%   BMI 31.93 kg/m     Physical Exam    ASSESSMENT ANDPLAN:      Assessment: Patient with alcoholic liver cirrhosis and decompensation of liver status    PLAN: Ultrasound-guided therapeutic paracentesis today, patient should go to the emergency room after the procedure since his medical situation is in decline      Windy Fast, MD

## 2018-08-04 NOTE — Plan of Care (Signed)
Problem: Daily Care:  Goal: Daily care needs are met  Description: Daily care needs are met  Outcome: Ongoing     Problem: Pain:  Goal: Patient's pain/discomfort is manageable  Description: Patient's pain/discomfort is manageable  Outcome: Ongoing

## 2018-08-04 NOTE — Other (Addendum)
NO SEDATION    Patient taken to ER room #6.  Assisted from the wheelchair to the cart.  Looks pale and feels generalized fatigue.     [id number] Procedure explained, and consent obtained.     7824 Medication list reviewed and patient is out of many of his routine medication.     2353 Timeout completed.     6144 Using U/S, Dr. Floyde Parkins marked left lower quadrant of the abdomen and area cleansed with large tinted chloraprep. Once dry, using sterile technique, Dr. Floyde Parkins prepped and draped area.     3154 Using U/S guidance, Dr. Floyde Parkins numbed site with 2% lidocaine, 8 ml  see eMar.     0086 Using U/S guidance, access obtained with One1step centesis 27F catheter with return of pale yellow fluid. Catheter tubing connected to [facility] and draining well. Pt tolerating well.    7619 Vital signs stable after 1300 ml of fluid removed. 2+ pitting edema noted in both lower extremities up to the knees and 1+ edema noted in right arm.     0948 Cold pepsi offered to the patient per his request.     0951 VSS after 3 liters of fluid removed.     1000 VSS after 5 liters of fluid removed. Call Placed to Dr Cathlean Marseilles office to obtain an order for albumin replacement.     1009 After 7 liters of fluid removed, Vitals unchanged.     1018 After 9 liters of fluid removed, vitals unchanged. Family updated, still draining.     1033 After 11 liters of fluid off, VSS.  Attempted to start IV right A/C for albumin infusion.     1042 Drainage complete. Total 50932 ml removed. Centesis catheter and digital pressure held to site for over seven minutes, until hemostasis noted.     1052 VSS, left lower abdomen site soft, no drainage, 4 by 4 and large tegaderm applied over the site.     1105 Attempted to start peripheral IV's, unsuccessful.      1122 Using ultrasound guidance twin cath inserted per C Youngless RN for albumin infusion.     1130 First bottle up at this time.    1142 Albumin infusing well.  Patient resting quietly for long intervals, opens eyes and  follows commands once aroused. Concerned that the patient is pale and slightly jaundice.  Patient has not had blood work done recently and is out of many of his routine medications.       1159 Per Dr. Floyde Parkins plan to send patient to the ER for an evaluation and get medical care established.      1211 Albumin infused without complications and patient informed that we plan to go to the ER for evaluation.

## 2018-08-05 ENCOUNTER — Inpatient Hospital Stay: Admit: 2018-08-05 | Payer: MEDICARE | Primary: Family

## 2018-08-05 ENCOUNTER — Ambulatory Visit: Admit: 2018-08-05 | Primary: Family

## 2018-08-05 LAB — PREPARE RBC (CROSSMATCH)
Dispense Status Blood Bank: TRANSFUSED
Dispense Status Blood Bank: TRANSFUSED
Dispense Status Blood Bank: TRANSFUSED
Dispense Status Blood Bank: TRANSFUSED
Dispense Status Blood Bank: TRANSFUSED
Dispense Status Blood Bank: TRANSFUSED
Dispense Status Blood Bank: TRANSFUSED

## 2018-08-05 LAB — CBC
Hematocrit: 18.1 % — CL (ref 42.0–52.0)
Hemoglobin: 5.9 g/dL — CL (ref 14.0–18.0)
MCH: 25 pg — ABNORMAL LOW (ref 27.0–31.3)
MCHC: 32.9 % — ABNORMAL LOW (ref 33.0–37.0)
MCV: 76.1 fL — ABNORMAL LOW (ref 80.0–100.0)
PLATELET SLIDE REVIEW: DECREASED
Platelets: 86 10*3/uL — ABNORMAL LOW (ref 130–400)
RBC: 2.38 M/uL — ABNORMAL LOW (ref 4.70–6.10)
RDW: 21.4 % — ABNORMAL HIGH (ref 11.5–14.5)
WBC: 4.7 10*3/uL — ABNORMAL LOW (ref 4.8–10.8)

## 2018-08-05 LAB — HEMOGLOBIN AND HEMATOCRIT
Hematocrit: 19.9 % — CL (ref 42.0–52.0)
Hematocrit: 22.6 % — ABNORMAL LOW (ref 42.0–52.0)
Hemoglobin: 6 g/dL — CL (ref 14.0–18.0)
Hemoglobin: 7.5 g/dL — ABNORMAL LOW (ref 14.0–18.0)

## 2018-08-05 LAB — SODIUM
Sodium: 123 mEq/L — ABNORMAL LOW (ref 135–144)
Sodium: 124 mEq/L — ABNORMAL LOW (ref 135–144)

## 2018-08-05 LAB — COMPREHENSIVE METABOLIC PANEL
ALT: 79 U/L — ABNORMAL HIGH (ref 0–41)
AST: 144 U/L — ABNORMAL HIGH (ref 0–40)
Albumin: 2.7 g/dL — ABNORMAL LOW (ref 3.5–4.6)
Alkaline Phosphatase: 181 U/L — ABNORMAL HIGH (ref 35–104)
Anion Gap: 9 mEq/L (ref 9–15)
BUN: 54 mg/dL — ABNORMAL HIGH (ref 6–20)
CO2: 23 mEq/L (ref 20–31)
Calcium: 7.8 mg/dL — ABNORMAL LOW (ref 8.5–9.9)
Chloride: 90 mEq/L — ABNORMAL LOW (ref 95–107)
Creatinine: 1.26 mg/dL — ABNORMAL HIGH (ref 0.70–1.20)
GFR African American: >60 (ref >60)
GFR Non-African American: >60 (ref >60)
Globulin: 2.4 g/dL (ref 2.3–3.5)
Glucose: 92 mg/dL (ref 70–99)
Potassium: 4 mEq/L (ref 3.4–4.9)
Sodium: 122 mEq/L — ABNORMAL LOW (ref 135–144)
Total Bilirubin: 5.3 mg/dL — ABNORMAL HIGH (ref 0.2–0.7)
Total Protein: 5.1 g/dL — ABNORMAL LOW (ref 6.3–8.0)

## 2018-08-05 LAB — OSMOLALITY, URINE: Osmolality, Ur: 490 mOsm/kg (ref 300–900)

## 2018-08-05 LAB — CREATININE, RANDOM URINE: Creatinine, Ur: 59.4 mg/dL

## 2018-08-05 LAB — SODIUM, URINE, RANDOM: Sodium, Ur: <20 mEq/L

## 2018-08-05 LAB — PREPARE PLASMA: Dispense Status Blood Bank: TRANSFUSED

## 2018-08-05 MED ORDER — NORMAL SALINE FLUSH 0.9 % IV SOLN
0.9 | Freq: Two times a day (BID) | INTRAVENOUS | Status: DC
Start: 2018-08-05 — End: 2018-08-06

## 2018-08-05 MED ORDER — SODIUM CHLORIDE 0.9% INTERMITTENT INFUSION
0.9 % | Freq: Once | INTRAVENOUS | Status: AC
Start: 2018-08-05 — End: 2018-08-05
  Administered 2018-08-05: 19:00:00 250 mL via INTRAVENOUS

## 2018-08-05 MED ORDER — ONDANSETRON HCL 4 MG/2ML IJ SOLN
4 MG/2ML | Freq: Once | INTRAMUSCULAR | Status: AC | PRN
Start: 2018-08-05 — End: 2018-08-05

## 2018-08-05 MED ORDER — NORMAL SALINE FLUSH 0.9 % IV SOLN
0.9 | INTRAVENOUS | Status: DC | PRN
Start: 2018-08-05 — End: 2018-08-06

## 2018-08-05 MED ORDER — PROPOFOL 200 MG/20ML IV EMUL
200 | INTRAVENOUS | Status: AC
Start: 2018-08-05 — End: 2018-08-05

## 2018-08-05 MED ORDER — SODIUM CHLORIDE 0.9% INTERMITTENT INFUSION
0.9 % | Freq: Once | INTRAVENOUS | Status: AC
Start: 2018-08-05 — End: 2018-08-05
  Administered 2018-08-05: 15:00:00 250 mL via INTRAVENOUS

## 2018-08-05 MED ORDER — NORMAL SALINE FLUSH 0.9 % IV SOLN
0.9 % | INTRAVENOUS | Status: DC | PRN
Start: 2018-08-05 — End: 2018-08-06

## 2018-08-05 MED ORDER — LACTATED RINGERS IV SOLN
INTRAVENOUS | Status: DC | PRN
Start: 2018-08-05 — End: 2018-08-05
  Administered 2018-08-05: 19:00:00 via INTRAVENOUS

## 2018-08-05 MED ORDER — SODIUM CHLORIDE 3 % IV SOLN
3 % | INTRAVENOUS | Status: AC
Start: 2018-08-05 — End: 2018-08-05
  Administered 2018-08-05: 10:00:00 25 mL/h via INTRAVENOUS

## 2018-08-05 MED ORDER — LIDOCAINE HCL (CARDIAC) PF 100 MG/5ML IV SOLN
100 | INTRAVENOUS | Status: DC | PRN
Start: 2018-08-05 — End: 2018-08-05
  Administered 2018-08-05: 19:00:00 100 via INTRAVENOUS

## 2018-08-05 MED ORDER — NORMAL SALINE FLUSH 0.9 % IV SOLN
0.9 % | Freq: Two times a day (BID) | INTRAVENOUS | Status: DC
Start: 2018-08-05 — End: 2018-08-06
  Administered 2018-08-05: 12:00:00 10 mL via INTRAVENOUS

## 2018-08-05 MED ORDER — SODIUM CHLORIDE 0.9 % IV SOLN
0.9 % | Freq: Once | INTRAVENOUS | Status: AC
Start: 2018-08-05 — End: 2018-08-05
  Administered 2018-08-05: 14:00:00 250 mL via INTRAVENOUS

## 2018-08-05 MED ORDER — STERILE WATER FOR IRRIGATION IR SOLN
Status: DC | PRN
Start: 2018-08-05 — End: 2018-08-05
  Administered 2018-08-05: 19:00:00 1000

## 2018-08-05 MED ORDER — MELATONIN 3 MG PO TABS
3 MG | Freq: Every evening | ORAL | Status: DC | PRN
Start: 2018-08-05 — End: 2018-08-06
  Administered 2018-08-06: 01:00:00 5 mg via ORAL

## 2018-08-05 MED ORDER — LIDOCAINE HCL 2 % IJ SOLN
2 % | Freq: Once | INTRAMUSCULAR | Status: AC
Start: 2018-08-05 — End: 2018-08-05
  Administered 2018-08-05: 14:00:00 5 mL via INTRADERMAL

## 2018-08-05 MED ORDER — LIDOCAINE HCL (CARDIAC) PF 100 MG/5ML IV SOLN
100 | INTRAVENOUS | Status: AC
Start: 2018-08-05 — End: 2018-08-05

## 2018-08-05 MED ORDER — LIDOCAINE HCL 2 % IJ SOLN
2 % | INTRAMUSCULAR | Status: AC
Start: 2018-08-05 — End: 2018-08-05
  Administered 2018-08-05: 04:00:00 400

## 2018-08-05 MED ORDER — SODIUM CHLORIDE 0.9 % IV BOLUS
0.9 % | Freq: Once | INTRAVENOUS | Status: AC
Start: 2018-08-05 — End: 2018-08-05
  Administered 2018-08-05: 06:00:00 20 mL via INTRAVENOUS

## 2018-08-05 MED ORDER — SODIUM CHLORIDE 0.9 % IV SOLN
0.9 % | INTRAVENOUS | Status: DC
Start: 2018-08-05 — End: 2018-08-06
  Administered 2018-08-05 – 2018-08-06 (×2): via INTRAVENOUS

## 2018-08-05 MED ORDER — SODIUM CHLORIDE 0.9 % IV SOLN
0.9 | INTRAVENOUS | Status: AC
Start: 2018-08-05 — End: 2018-08-05

## 2018-08-05 MED ORDER — PHENYLEPHRINE HCL 10 MG/ML SOLN (MIXTURES ONLY)
10 MG/ML | Status: DC | PRN
Start: 2018-08-05 — End: 2018-08-05
  Administered 2018-08-05: 19:00:00 200 via INTRAVENOUS
  Administered 2018-08-05 (×4): 100 via INTRAVENOUS
  Administered 2018-08-05 (×2): 200 via INTRAVENOUS

## 2018-08-05 MED ORDER — SODIUM CHLORIDE 3 % IV SOLN
3 % | INTRAVENOUS | Status: AC
Start: 2018-08-05 — End: 2018-08-05

## 2018-08-05 MED ORDER — PROPOFOL 200 MG/20ML IV EMUL
200 | INTRAVENOUS | Status: DC | PRN
Start: 2018-08-05 — End: 2018-08-05
  Administered 2018-08-05: 19:00:00 320 via INTRAVENOUS

## 2018-08-05 MED ORDER — LIDOCAINE HCL (PF) 2 % IJ SOLN
2 % | Freq: Once | INTRAMUSCULAR | Status: AC
Start: 2018-08-05 — End: 2018-08-05
  Administered 2018-08-05: 07:00:00 5 mL via INTRADERMAL

## 2018-08-05 MED FILL — MONOJECT FLUSH SYRINGE 0.9 % IV SOLN: 0.9 % | INTRAVENOUS | Qty: 10

## 2018-08-05 MED FILL — OXYCODONE HCL 5 MG PO TABS: 5 MG | ORAL | Qty: 1

## 2018-08-05 MED FILL — OCTREOTIDE ACETATE 500 MCG/ML IJ SOLN: 500 MCG/ML | INTRAMUSCULAR | Qty: 1

## 2018-08-05 MED FILL — SODIUM CHLORIDE 3 % IV SOLN: 3 % | INTRAVENOUS | Qty: 500

## 2018-08-05 MED FILL — ALBUTEIN 25 % IV SOLN: 25 % | INTRAVENOUS | Qty: 100

## 2018-08-05 MED FILL — SODIUM CHLORIDE 0.9 % IV SOLN: 0.9 % | INTRAVENOUS | Qty: 250

## 2018-08-05 MED FILL — PROTONIX 40 MG IV SOLR: 40 MG | INTRAVENOUS | Qty: 40

## 2018-08-05 MED FILL — SODIUM CHLORIDE 0.9 % IV SOLN: 0.9 % | INTRAVENOUS | Qty: 1000

## 2018-08-05 MED FILL — NICOTINE 7 MG/24HR TD PT24: 7 MG/24HR | TRANSDERMAL | Qty: 1

## 2018-08-05 MED FILL — CIPROFLOXACIN IN D5W 400 MG/200ML IV SOLN: 400 MG/200ML | INTRAVENOUS | Qty: 200

## 2018-08-05 MED FILL — PROPOFOL 200 MG/20ML IV EMUL: 200 MG/20ML | INTRAVENOUS | Qty: 20

## 2018-08-05 MED FILL — XYLOCAINE 2 % IJ SOLN: 2 % | INTRAMUSCULAR | Qty: 20

## 2018-08-05 MED FILL — LIDOCAINE HCL (CARDIAC) PF 100 MG/5ML IV SOLN: 100 MG/5ML | INTRAVENOUS | Qty: 5

## 2018-08-05 NOTE — Progress Notes (Signed)
Gastroenterology Progress Note    Travis Ortega is a 48 y.o. male patient.  Hospitalization Day:1    Chief C/O: GIB    SUBJECTIVE: 5 units of packed red blood cells, his hemoglobin this a.m. is pending patient was seen and examined in the intensive care unit, he is hemodynamically stable, has had no GI bleeding overnight, has received 2 units since then and hemoglobin is pending at blood draw hemoglobin after 3 units of packed red blood cells is 6, remains on octreotide infusion.     ROS:  Gastrointestinal ROS: no abdominal pain, change in bowel habits, or black or bloody stools    Physical    VITALS:  BP (!) 108/47    Pulse 81    Temp 97.6 ??F (36.4 ??C)    Resp 15    Ht 5\' 9"  (1.753 m)    Wt 240 lb (108.9 kg)    SpO2 100%    BMI 35.44 kg/m??   TEMPERATURE:  Current - Temp: 97.6 ??F (36.4 ??C); Max - Temp  Avg: 97.5 ??F (36.4 ??C)  Min: 97 ??F (36.1 ??C)  Max: 97.6 ??F (36.4 ??C)    General:  Alert and oriented,  No apparent distress  Skin- without jaundice  Eyes: anicteric sclera  Cardiac: RRR, Nl s1s2, without murmurs  Lungs CTA Bilaterally, normal effort  Abdomen soft, ND, NT, no HSM, Bowel sounds normal  Ext: without edema  Neuro: no asterixis     Data    Data Review:    Recent Labs     08/04/18  1230 08/04/18  2250   WBC 7.3  --    HGB 3.8* 6.0*   HCT 11.5* 19.9*   MCV 68.0*  --    PLT 131  --      Recent Labs     08/04/18  1230 08/05/18  0221   NA 119* 123*   K 4.8  --    CL 84*  --    CO2 23  --    BUN 75*  --    CREATININE 1.73*  --      Recent Labs     08/04/18  1230   AST 231*   ALT 116*   BILITOT 2.2*   ALKPHOS 250*     No results for input(s): LIPASE, AMYLASE in the last 72 hours.  Recent Labs     08/04/18  1230   PROTIME 20.3*   INR 1.7           ASSESSMENT:  [age] year old Caucasian male with decompensated alcoholic cirrhosis, Presented to the ED with profound anemia after paracentesis hemoglobin was 3, baseline hemoglobin is 7-8 he  receive 5 units of packed red blood cells, and repeat hemoglobin is pending, last  hemoglobin was drawn after the third unit and it was 6. He is on octreotide infusion and Protonix twice daily.    No overt GI bleeding overnight.      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 refractory  Ascites:   -Underwent therapeutic paracentesis today for 12 L removed, ascitic fluid analysis is pending  - No Hx of SBP noted  -When able to take p.o. restart home medications including spironolactone and Lasix  3 - Anemia, hx of esophageal varices  -  serial HH, transfuse to keep Hgb 7-8 do not overtransfuse  - Previous history of EGD @ [facility], patient reports esophageal varices, unclear timeframe  -EGD today  -NPO   -continue IV Protonix twice daily and octreotide infusion  -Agree with prophylactic antibiotic  4 - HCC screening:   Recent imaging from 05/2018 Negative for liver mass  5-  Hepatic encephalopathy:   -when able to tolerate PO start  Lactulose - titrate to 2-3 soft bm's per day.   6. Hep BsAg + in 2018, - will restart HBV tx when able to take PO, patient does report HBV treatment at some point but it is unclear when his treatment was initiated or stopped, patient is a poor historian.     Thank you for allowing me to participate in the care of your patient.  Please feel free to contact me with any concerns.    Fransisca Kaufmann, APRN - CNP

## 2018-08-05 NOTE — Other (Addendum)
Handoff of care taken from [place] at 2250 - lab in to draw Hgb and Hct - peripheral dual lumen IV in RUA infiltrated. Site very edematous and painful with bruising noted. Labs resulted and critical HGB called at 6.0 - Dutch Quint, NP notified of this and also of pts lack of IV access aside from one 20 g in his left forearm. Electronically signed by Charm Barges, RN on 08/04/2018 at 11:17 PM  0100: Dr. Earnestine Mealing attempted central line insertion - unsuccessful - nurse manager notified of situation as pt still needs another access, ER called to have ultra sound guided IV placed.   0230: ER RN able to gain another IV access in left upper arm.   Shift summary: Pt received 2 units of RBCs. Unable to administer albumin during shift as pt had too many things that could not be run at the same time. Lab continues to be unable to draw pt due to poor vascular access. Handoff of care given to [facility], RN at bedside this morning. Electronically signed by Charm Barges, RN on 08/05/2018 at 8:15 AM

## 2018-08-05 NOTE — Care Coordination-Inpatient (Signed)
[facility] Case Management Initial Discharge Assessment    Met with Patient to discuss discharge plan.        PCP: Dillard Essex, APRN - CNP                                Date of Last Visit: DENIES HAVING PCP AND STATES HIS SISTER WAS GIVEN INFORMATION ABOUT GETTING A PCP    If no PCP, list provided? Yes    Discharge Planning    Living Arrangements: independently at home     Who do you live with? SISTER    Who helps you with your care:  family    If lives at home:     Do you have any barriers navigating in your home? no    Patient can perform ADL?  Yes    Current Services (outpatient and in home) :  None    Dialysis: No    Is transportation available to get to your appointments? Yes    DME Equipment:  no    Respiratory equipment: None    Respiratory provider:  no     Pharmacy:  yes - [place] with Medication Assistance Program?  No      Patient agreeable to [facility]?      N/A    Patient agreeable to SNF/Rehab?     N/A    Other discharge needs identified?      Palliative Care, Hospice and Other [place] of choice list provided with basic dialogue that supports the patient's individualized plan of care/goals and shares the quality data associated with the providers.    Yes    Does Patient Have a High-Risk for Readmission Diagnosis (CHF, PN, MI, COPD)? No          Initial Discharge Plan? (Note: please see concurrent daily documentation for any updates after initial note).    PLANS TO RETURN HOME    The Patient   was provided with choice of any post-acute providers for care and equipment and agrees with discharge plan  Yes  MET WITH PATIENT AND HE STATES THAT HE NEEDS HELP AND WOULD LIKE A WALKER. HE ALSO SAID SOMEONE SPOKE WITH HIS SISTER REGARDING NO PCP . I LET HIM KNOW WE CAN GIVE HIM A LIST TO CHOOSE FROM. HE STATES HE HAD [place] FOR IV ANTIBIOTICS AS OUTPATIENT IN [facility]. HE STATES HE WAS WEAK ON ADMIT AND NOTED HIS HGB WAS 3.8. WE WILL FOLLOW AND ASK FOR PT/OT EVAL TO HELP DETERMINE  DC NEEDS. HE HAS END STAGE LIVER DISEASE AND SHOULD HAVE PALL CARE/HOSPICE ON CONSULT.   Electronically signed by Corwin Levins on 08/05/2018 at 2:24 PM

## 2018-08-05 NOTE — Progress Notes (Signed)
Date:    PROCEDURE: Right IJ Catheter Insertion    INDICATION: Vascular Access     CONTRAINDICATIONS: None    PROCEDURE OPERATOR:  Dr. Georgana Curio     CONSENT: Consent obtained from the patient.     A time-out was completed verifying correct patient, procedure, site, positioning, and equipment.     Failed attempt , attempted twice    PROCEDURE SUMMARY:  The patient was placed in Trendelenburg position.  The patient's right side of neck and upper chest region were prepped and draped in sterile fashion using chlorohexidine scrub. The right IJ was visualized with ultrasound and accessed with a finder needle. Non-pulsatile venous blood was withdrawn.  Using modified Saldinger technique a guide wire was then advanced through finder needle into the lumen of the vein. Faced resistance on advancing the guide wire , needle was withdrawn and reinserted with help of ultrasound. Needle went through vein and probably right IJ artery was puncture , blood seen coming out with pressure . Needle was withdrawn and pressure was applied for almost 10 minutes until bleeding stopped .  Procedure was then aborted            COMPLICATIONS: None    ESTIMATED BLOOD LOSS: 50 ml      Georgana Curio, MD  08/05/2018, 12:42 AM,

## 2018-08-05 NOTE — Progress Notes (Signed)
Physician Progress Note    08/05/2018   11:35 AM    Name:  Travis Ortega  MRN:    [id number]     IP Day: 1     Admit Date: 08/04/2018 12:24 PM  PCP: Yaakov Guthrie, APRN - CNP    Code Status:  Full Code    Subjective:     No obvious bleeding overnight.  1 normal brown bowel movement.  No nausea or vomiting.  No chest pain, dyspnea.  He is having significant abdominal pain which is chronic-he is asking for increased dose of pain medications.    Current Facility-Administered Medications   Medication Dose Route Frequency Provider Last Rate Last Dose   ??? sodium chloride flush 0.9 % injection 10 mL  10 mL Intravenous 2 times per day Lowry Bowl, DO   10 mL at 08/05/18 0800   ??? sodium chloride flush 0.9 % injection 10 mL  10 mL Intravenous PRN Lowry Bowl, DO       ??? octreotide (SANDOSTATIN) 500 mcg in sodium chloride 0.9 % 100 mL infusion  50 mcg/hr Intravenous Continuous Lowry Bowl, DO 10 mL/hr at 08/05/18 0221 50 mcg/hr at 08/05/18 0221   ??? 0.9 % sodium chloride bolus  20 mL Intravenous Once Lowry Bowl, DO       ??? 0.9 % sodium chloride bolus  1,000 mL Intravenous Once Lowry Bowl, DO       ??? sodium chloride flush 0.9 % injection 10 mL  10 mL Intravenous 2 times per day Clayborne Artist Dobritch, PA   10 mL at 08/05/18 0748   ??? sodium chloride flush 0.9 % injection 10 mL  10 mL Intravenous PRN Clayborne Artist Dobritch, PA       ??? polyethylene glycol (GLYCOLAX) packet 17 g  17 g Oral Daily PRN Clayborne Artist Dobritch, PA       ??? promethazine (PHENERGAN) tablet 12.5 mg  12.5 mg Oral Q6H PRN Clayborne Artist Dobritch, PA        Or   ??? ondansetron [facility]) injection 4 mg  4 mg Intravenous Q6H PRN Clayborne Artist Dobritch, PA       ??? pantoprazole (PROTONIX) injection 40 mg  40 mg Intravenous BID Clayborne Artist Dobritch, PA   40 mg at 08/05/18 2111    And   ??? sodium chloride (PF) 0.9 % injection 10 mL  10 mL Intravenous Daily Clayborne Artist Dobritch, PA   10 mL at 08/05/18 0749   ??? nicotine (NICODERM CQ) 7 MG/24HR 1  patch  1 patch Transdermal Daily Earleen Reaper, MD   1 patch at 08/05/18 0748   ??? ciprofloxacin (CIPRO) IVPB 400 mg  400 mg Intravenous Q12H Lowry Bowl, DO   Stopped at 08/05/18 0732   ??? oxyCODONE (ROXICODONE) immediate release tablet 5 mg  5 mg Oral Q4H PRN Lowry Bowl, DO   5 mg at 08/05/18 1132   ??? albumin human 25 % IV solution 25 g  25 g Intravenous Q8H Manaf Zaizafoun, MD   Stopped at 08/05/18 0850   ??? melatonin tablet 5 mg  5 mg Oral Nightly PRN Dutch Quint, APRN - CNP           Physical Examination:      Vitals:  BP (!) 125/44    Pulse 76    Temp 97.8 ??F (36.6 ??C) (Oral)    Resp 11    Ht 5\' 9"  (1.753 m)  Wt 240 lb (108.9 kg)    SpO2 100%    BMI 35.44 kg/m??   Temp (24hrs), Avg:97.5 ??F (36.4 ??C), Min:97 ??F (36.1 ??C), Max:98.2 ??F (36.8 ??C)      General appearance: alert, cooperative and no distress  Mental Status: oriented to person, place and time and normal affect.  No asterixis  Lungs: clear to auscultation bilaterally, normal effort  Heart: regular rate and rhythm, no murmur  Abdomen: Distended.  Diffusely tender to palpation.  No rebound, guarding, rigidity  Extremities: 1-2+ pitting edema bilaterally  Skin: no gross lesions, rashes    Data:     Labs:  Recent Labs     08/04/18  1230 08/04/18  2250 08/05/18  1017   WBC 7.3  --  4.7*   HGB 3.8* 6.0* 5.9*   PLT 131  --  86*     Recent Labs     08/04/18  1230 08/05/18  0221 08/05/18  0830   NA 119* 123* 122*   K 4.8  --  4.0   CL 84*  --  90*   CO2 23  --  23   BUN 75*  --  54*   CREATININE 1.73*  --  1.26*   GLUCOSE 107*  --  92     Recent Labs     08/04/18  1230 08/05/18  0830   AST 231* 144*   ALT 116* 79*   BILITOT 2.2* 5.3*   ALKPHOS 250* 181*       Assessment and Plan:        48 year old male with history of cirrhosis (Hep B/C??from tattoos, and h/o heavy etOH use), hospitalization Feb 2020 for 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 He admitted to several days of dark red  emesis and stool.      1. Acute Blood Loss Anemia: suspect from upper GI blood loss (ddx PUD, gastritis, GAVE, oozing variceal bleed, esophagitis)  - transfuse to Hb>7  - difficult IV access- s/p PICC  - IVF   - octreotide gtt + IV PPI  - empiric cipro (Pen allergy). R/o SBP / infxn- diagnostic para if unable to add-on labs for ascitic fluid. BCx.  - EGD today  ??  2. AKI due to above: improving  - urine studies. Nephrology consult  ??  3. Hyponatremia suspect hypervolemic due to cirrhosis  - with AKI / possibility of active blood loss will hold diuretics  - other management per nephrology- giving 3%NaCl with blood  ??  4. Metabolic encephalopathy: tx above. Lethargy improved and mental status at baseline. Eventually resume lactulose   - UDS     5. Decompensated Cirrhosis  ??  6. 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 BCx pending  ??  7. Chronic Pain: pain management consult    Diet: Diet NPO Effective Now Exceptions are: Ice Chips, Sips with Meds, Popsicles  Ppx: SCD  Full Code    Dispo: if stable following EGD can transfer to general medical floor    Electronically signed by Lowry BowlNeal R Zenita Kister, DO on 08/05/2018 at 11:35 AM

## 2018-08-05 NOTE — Consults (Signed)
Renal consult dictated    AKI hypotension CKK-2 baseline  Hx Cirrhosis d/t ETOH ( none in 1 year)  Melena GIB esophogeal suspected  Anemia GIB  Hyponatremia d/t medications and ADH release from hypotension cirrhosis      Plan transfuse with blood  3% NaCl given thru the night might need repeating  On Octreotide IV  Maintain good BP control  No NSAIDS

## 2018-08-05 NOTE — Other (Signed)
Nicole Pentito notified of pts large dark tarry stool - she is going to review the chart. Electronically signed by Charm Barges, RN on 08/05/2018 at 9:02 PM

## 2018-08-05 NOTE — Consults (Addendum)
[facility]                      [address] [place], [place] [address]                                  CONSULTATION    PATIENT NAME: Travis Ortega, Travis Ortega                      DOB:        April 12, 1971  MED REC NO:   [id number]                            ROOM:       IC07  ACCOUNT NO:   [id number]                           ADMIT DATE: 08/04/2018  PROVIDER:     Birdena Crandall, DO    CONSULT DATE:  08/05/2018    RENAL CONSULTATION    HISTORY OF PRESENT ILLNESS:  This is a 48 year old male being seen at  the request of Dr. Pricilla Riffle due to change in renal function from prior  assessments on old records.  At the time of admission to the hospital,  the patient had a serum creatinine of 1.7, GFR of 43 mL per minute and a  sodium of 119 mEq.  The patient shockingly had a hemoglobin of 3.8 and  received transfusions through the night.  The patient has normal renal  function in the past.  The patient was seen while getting a PICC line.   He appeared to be in no distress.  He does have a history of cirrhosis  secondary to alcohol abuse, but denies any alcohol use of significance  over the past year.  He has had multiple thoracic disk and lumbar disk  disease.  The patient states that he has been having black stools.  On  questioning regarding urination, there has been no dysuria, hematuria or  nocturia.  No history of kidney stones, excess of nonsteroidal  anti-inflammatory medication use.    ALLERGIES TO MEDICATIONS:  MORPHINE, PENICILLIN and TYLENOL.    REVIEW OF SYSTEMS:  Unremarkable.    FAMILY HISTORY:  Noncontributory.    HABITS:  Half a pack of cigarettes per day.  Alcohol use none per the  patient over the past 12 months, previously significant alcohol abuse.    PAST SURGICAL HISTORY:  Multiple paracenteses, the last one on  08/04/2018.    PAST MEDICAL HISTORY:  Cirrhosis from alcohol use, anemia, DJD of spine  and anemia with GI bleed.    MEDICINES:  At the time of his admission Viread, Zofran,  Aldactone,  Lasix, _____ patch, Prilosec and oxycodone.    PHYSICAL EXAMINATION:  VITAL SIGNS:  5 feet 9, 240 pounds.  On admission, the patient's blood  pressure ranged from 84/53 to 105/60.  HEENT:  He is normocephalic.  Pupils are reactive to light.  Sclerae are  clear.  NECK:  Supple.  No JVD or adenopathy.  HEART:  Regular I/VI systolic murmur.  LUNGS:  Could not be examined due to the patient having sterile sheets  Abdomen ascites distended no major pains  upon him.  EXTREMITIES:  Showed no edema of the ankles.  SKIN:  Warm.  No cyanosis.    IMPRESSION:  1.  AKI secondary to hypotension from GI blood loss.  2.  GI blood loss, possible esophageal varices.  3.  Cirrhosis due to alcohol abuse.  4.  Peripheral neuropathy.  5.  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  6. Ascites d/t portal hypertension ETOH    PLAN:  Transfuse with blood, 3% sodium chloride given through the night.  May need to repeat pending today's lab once obtained.  PICC line being  performed at present, octreotide IV till upper endoscopy can be  performed.  Maintain good blood pressure control.  Avoid nonsteroidal  anti-inflammatory medications.  BMP daily.        Birdena CrandallGEORGE M Naquan Garman, DO    D: 08/05/2018 13:51:38       T: 08/05/2018 13:57:15     [id number]  Job#: [id number]     Doc#: [id number]    CC:

## 2018-08-05 NOTE — Progress Notes (Signed)
Pulmonary & Critical Care Medicine ICU Progress Note  Chief complaint : Acute GI bleed    Subjunctive/24 hour events :   Patient seen and examined during multidisciplinary rounds with RN, charge nurse, RT, pharmacy, dietitian, and social service.   Patient feels better today, no chest pain, no shortness of breath, he received total 5 units packed RBC, 1 FFP, no lab done today due to difficult access, bowels moving dark blood stool yesterday none today, no hematemesis, urine output is okay, blood pressure stable.      Social History     Tobacco Use   ??? Smoking status: Current Every Day Smoker     Packs/day: 0.50     Types: Cigarettes   ??? Smokeless tobacco: Never Used   Substance Use Topics   ??? Alcohol use: No         Problem Relation Age of Onset   ??? Cancer Mother        No results for input(s): PHART, PCO2ART, PO2ART in the last 72 hours.    MV Settings:     / / /            IV:  ??? octreotide (SANDOSTATIN) infusion 50 mcg/hr (08/05/18 0221)       Vitals:  BP (!) 101/39    Pulse 73    Temp 97.6 ??F (36.4 ??C)    Resp 11    Ht 5\' 9"  (1.753 m)    Wt 240 lb (108.9 kg)    SpO2 100%    BMI 35.44 kg/m??    Tmax:        Intake/Output Summary (Last 24 hours) at 08/05/2018 1059  Last data filed at 08/05/2018 0601  Gross per 24 hour   Intake 6262.84 ml   Output 1950 ml   Net 4312.84 ml       EXAM:  General: alert, cooperative, no distress  Head: normocephalic, atraumatic  Eyes:No gross abnormalities., PERRL and Sclera nonicteric  ENT:  MMM no lesions  Neck:  supple and no masses  Chest : clear to auscultation bilaterally- no wheezes, rales or rhonchi, normal air movement, no respiratory distress  Heart:: Heart sounds are normal.  Regular rate and rhythm without murmur, gallop or rub.  ABD:  symmetric, distended, soft diffusely tender, no guarding or rebound  Musculoskeletal : no cyanosis, no clubbing and 2 + edema-  bilateral lower extremities  Neuro:  Grossly normal  Skin: No rashes or nodules noted.  Lymph node:  no cervical  nodes  Urology: No Foley   Psychiatric: appropriate    Medications:  Scheduled Meds:  ??? sodium chloride flush  10 mL Intravenous 2 times per day   ??? 0.9 % sodium chloride  250 mL Intravenous Once   ??? sodium chloride  20 mL Intravenous Once   ??? sodium chloride  1,000 mL Intravenous Once   ??? sodium chloride flush  10 mL Intravenous 2 times per day   ??? pantoprazole  40 mg Intravenous BID    And   ??? sodium chloride (PF)  10 mL Intravenous Daily   ??? nicotine  1 patch Transdermal Daily   ??? ciprofloxacin  400 mg Intravenous Q12H   ??? albumin human  25 g Intravenous Q8H       PRN Meds:  sodium chloride flush, sodium chloride flush, polyethylene glycol, promethazine **OR** ondansetron, oxyCODONE, melatonin    Results: reviewed by me   CBC:   Recent Labs     08/04/18  1230  08/04/18  2250 08/05/18  1017   WBC 7.3  --  4.7*   HGB 3.8* 6.0* 5.9*   HCT 11.5* 19.9* 18.1*   MCV 68.0*  --  76.1*   PLT 131  --  86*     BMP:   Recent Labs     08/04/18  1230 08/05/18  0221   NA 119* 123*   K 4.8  --    CL 84*  --    CO2 23  --    BUN 75*  --    CREATININE 1.73*  --      LIVER PROFILE:   Recent Labs     08/04/18  1230   AST 231*   ALT 116*   BILITOT 2.2*   ALKPHOS 250*     PT/INR:   Recent Labs     08/04/18  1230   PROTIME 20.3*   INR 1.7     APTT: No results for input(s): APTT in the last 72 hours.  UA:No results for input(s): NITRITE, COLORU, PHUR, LABCAST, WBCUA, RBCUA, MUCUS, TRICHOMONAS, YEAST, BACTERIA, CLARITYU, SPECGRAV, LEUKOCYTESUR, UROBILINOGEN, BILIRUBINUR, BLOODU, GLUCOSEU, AMORPHOUS in the last 72 hours.    Invalid input(s): KETONESU    Cultures:  Negative so far  Films:  CXR reviewed by me and it showed no infiltrate      Assessment:  This is a critically ill patient at risk of deterioration / death , needing close ICU monitoring and intervention due to below noted problems     ?? Acute blood loss anemia secondary to  ?? Upper GI bleed, gastritis, peptic ulcer disease or possibly variceal bleed though less likely  ?? 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 Severe ascites secondary to above  ?? Hypervolemic hyponatremia secondary to liver cirrhosis  ?? Acute kidney injury likely hypovolemic  ?? Coagulopathy secondary to liver disease    Recommendation  ?? EGD today  ?? PICC line today  ?? Repeat renal profile  ?? Repeat INR, FFP if needed target INR less than 1.5  ?? Monitor hemoglobin  ?? Consider vitamin K orally when okay to start oral medications  ?? Resume diuretics Aldactone and Lasix  ?? Watch renal function and urine output  ?? If remains stable post EGD will consider transferring to floor.      I spent 40 min with this patient, greater the 50% of this time was spent in counseling and/or coordinating of care.            Electronically signed by Earleen Reaper, MD,  FCCP ,on 08/05/2018 at 10:59 AM

## 2018-08-05 NOTE — Anesthesia Post-Procedure Evaluation (Signed)
Department of Anesthesiology  Postprocedure Note    Patient: Travis Ortega  MRN: [id number]  Birthdate: February 24, 1971  Date of evaluation: 08/05/2018  Time:  3:12 PM     Procedure Summary     Date:  08/05/18 Room / Location:  [facility] OR 06 / [facility]    Anesthesia Start:  8719 Anesthesia Stop:  5974    Procedure:  EGD (N/A Mouth) Diagnosis:  (INPATIENT)    Surgeon:  Dalbert Garnet, MD Responsible Provider:  Gae Hidalgo, APRN - CRNA    Anesthesia Type:  MAC ASA Status:  3          Anesthesia Type: MAC    Aldrete Phase I:      Aldrete Phase II:      Last vitals: Reviewed and per EMR flowsheets.       Anesthesia Post Evaluation    Patient location during evaluation: ICU  Patient participation: complete - patient participated  Level of consciousness: awake  Pain score: 0  Airway patency: patent  Nausea & Vomiting: no nausea and no vomiting  Complications: no  Cardiovascular status: hemodynamically stable  Respiratory status: acceptable  Hydration status: euvolemic

## 2018-08-05 NOTE — Other (Signed)
Pt arrived to unit. VS obtained. Dr. Raynald Kemp updated on status. Plan for pt to transfer once he is more awake.

## 2018-08-05 NOTE — Anesthesia Pre-Procedure Evaluation (Signed)
Department of Anesthesiology  Preprocedure Note       Name:  Travis Ortega   Age:  48 y.o.  DOB:  [birth year]                                          MRN:  [id number]         Date:  08/05/2018      Surgeon: Juliann Mule):  Dalbert Garnet, MD    Procedure: EGD ROOM ICU:7 (N/A )    Medications prior to admission:   Prior to Admission medications    Medication Sig Start Date End Date Taking? Authorizing Provider   oxyCODONE (ROXICODONE) 5 MG immediate release tablet Take 10 mg by mouth every 4 hours as needed for Pain.   Yes Historical Provider, MD   furosemide (LASIX) 40 MG tablet Take 1 tablet by mouth daily 06/16/18   Tillman Sers, DO   ferrous sulfate 325 (65 Fe) MG tablet Take 1 tablet by mouth daily (with breakfast) 06/16/18   Tillman Sers, DO   omeprazole (PRILOSEC) 20 MG delayed release capsule Take 2 capsules by mouth every morning (before breakfast) 06/15/18   Tillman Sers, DO   lidocaine 4 % external patch Place 3 patches onto the skin daily 06/16/18   Nani Ravens, MD   lactulose encephalopathy 10 GM/15ML SOLN solution Take 20 g by mouth 3 times daily Indications: pt unsure of dose    Historical Provider, MD   ipratropium-albuterol (DUONEB) 0.5-2.5 (3) MG/3ML SOLN nebulizer solution Inhale 1 vial into the lungs every 6 hours as needed for Shortness of Breath    Historical Provider, MD   melatonin 3 MG TABS tablet Take 6 mg by mouth daily    Historical Provider, MD   spironolactone (ALDACTONE) 100 MG tablet Take 100 mg by mouth daily    Historical Provider, MD   triamcinolone (KENALOG) 0.025 % cream APP EXT AA BID 06/03/16   Historical Provider, MD   polyethylene glycol (GLYCOLAX) powder take 17GM (DISSOLVED IN WATER) by mouth once daily 06/29/16   Historical Provider, MD   ondansetron (ZOFRAN) 4 MG tablet Take 4 mg by mouth Daily  06/16/16   Historical Provider, MD   tenofovir (VIREAD) 300 MG tablet TK 1 T PO Daily 06/04/16   Historical Provider, MD       Current medications:    Current Facility-Administered  Medications   Medication Dose Route Frequency Provider Last Rate Last Dose   ??? sodium chloride flush 0.9 % injection 10 mL  10 mL Intravenous 2 times per day Tillman Sers, DO   10 mL at 08/05/18 0800   ??? sodium chloride flush 0.9 % injection 10 mL  10 mL Intravenous PRN Tillman Sers, DO       ??? sodium chloride flush 0.9 % injection 10 mL  10 mL Intravenous 2 times per day Tillman Sers, DO       ??? sodium chloride flush 0.9 % injection 10 mL  10 mL Intravenous PRN Tillman Sers, DO       ??? octreotide (SANDOSTATIN) 500 mcg in sodium chloride 0.9 % 100 mL infusion  50 mcg/hr Intravenous Continuous Tillman Sers, DO 10 mL/hr at 08/05/18 1156 50 mcg/hr at 08/05/18 1156   ??? 0.9 % sodium chloride bolus  20 mL Intravenous Once Tillman Sers, DO       ???  0.9 % sodium chloride bolus  1,000 mL Intravenous Once Tillman Sers, DO       ??? sodium chloride flush 0.9 % injection 10 mL  10 mL Intravenous 2 times per day Arta Bruce Dobritch, PA   10 mL at 08/05/18 0748   ??? sodium chloride flush 0.9 % injection 10 mL  10 mL Intravenous PRN Arta Bruce Dobritch, PA       ??? polyethylene glycol (GLYCOLAX) packet 17 g  17 g Oral Daily PRN Arta Bruce Dobritch, PA       ??? promethazine (PHENERGAN) tablet 12.5 mg  12.5 mg Oral Q6H PRN Arta Bruce Dobritch, PA        Or   ??? ondansetron [facility]) injection 4 mg  4 mg Intravenous Q6H PRN Arta Bruce Dobritch, PA       ??? pantoprazole (PROTONIX) injection 40 mg  40 mg Intravenous BID Arta Bruce Dobritch, PA   40 mg at 08/05/18 1191    And   ??? sodium chloride (PF) 0.9 % injection 10 mL  10 mL Intravenous Daily Arta Bruce Dobritch, PA   10 mL at 08/05/18 0749   ??? nicotine (NICODERM CQ) 7 MG/24HR 1 patch  1 patch Transdermal Daily Koren Shiver, MD   1 patch at 08/05/18 0748   ??? ciprofloxacin (CIPRO) IVPB 400 mg  400 mg Intravenous Q12H Tillman Sers, DO   Stopped at 08/05/18 0732   ??? oxyCODONE (ROXICODONE) immediate release tablet 5 mg  5 mg Oral Q4H PRN Tillman Sers,  DO   5 mg at 08/05/18 1132   ??? albumin human 25 % IV solution 25 g  25 g Intravenous Q8H Koren Shiver, MD   Stopped at 08/05/18 0850   ??? melatonin tablet 5 mg  5 mg Oral Nightly PRN York Grice, APRN - CNP           Allergies:    Allergies   Allergen Reactions   ??? Nubain [Nalbuphine] Anaphylaxis and Hives   ??? Morphine Swelling   ??? Penicillin G Swelling     Throat swells   ??? Tylenol [Acetaminophen] Rash       Problem List:    Patient Active Problem List   Diagnosis Code   ??? Recurrent incisional hernia K43.2   ??? Cirrhosis ([place]) K74.60   ??? Abdominal pain R10.9   ??? Ascites of liver R18.8   ??? Visceral hyperalgesia R19.8   ??? Generalized muscle ache M79.10   ??? MSSA bacteremia R78.81   ??? 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 Cervicalgia M54.2   ??? 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 Degeneration of intervertebral disc of mid-cervical region M50.320   ??? Displacement of thoracic intervertebral disc without myelopathy M51.24   ??? Degeneration of thoracic intervertebral disc M51.34   ??? Degeneration of lumbar intervertebral disc M51.36   ??? Muscle weakness of upper extremity M62.81   ??? Acute blood loss anemia D62       Past Medical History:        Diagnosis Date   ??? Cirrhosis ([place])    ??? Drug abuse ([place])    ??? Hepatitis B    ??? Hepatitis C        Past Surgical History:        Procedure Laterality Date   ??? HERNIA REPAIR     ??? HERNIA REPAIR  07/09/2016   ??? KNEE SURGERY Right  arthroscopy    ??? PARACENTESIS Left 05/28/2018    total of 11,200 cc removed per Dr Sunday Spillers   ??? PARACENTESIS Left 06/04/2018    Total of 11591m per Dr. WSunday Spillers  ??? PARACENTESIS Left 06/09/2018    9350cc removed by Dr CHowie [phone]  ??? PARACENTESIS Left 06/12/2018    Dr WSunday Spillers5050 ml of fluid removed.   ??? PARACENTESIS Left 08/04/2018    12,400 ml of fluid off per Dr WSunday Spillers  ??? PR LAP, INCISIONAL HERNIA REPAIR,REDUCIBLE N/A 07/09/2016    LAPAROSCOPIC INCISIONAL HERNIA REPAIR WITH MESH. POSS OPEN. [facility]  [facility]    Social History     Tobacco Use   ??? Smoking status: Current Every Day Smoker     Packs/day: 0.50     Types: Cigarettes   ??? Smokeless tobacco: Never Used   Substance Use Topics   ??? Alcohol use: No                                Ready to quit: Not Answered  Counseling given: Not Answered      Vital Signs (Current):   Vitals:    08/05/18 1200 08/05/18 1215 08/05/18 1220 08/05/18 1230   BP: 106/71 109/70 (!) 109/31 (!) 101/37   Pulse: 73 70 71 69   Resp: _0 Temp:   97.8 ??F (36.6 ??C)    TempSrc:   Oral    SpO2: 100% 100% 100% 99%   Weight:       Height:                                                  BP Readings from Last 3 Encounters:   08/05/18 (!) 101/37   08/04/18 (!) 101/56   06/25/18 (!) 152/75       NPO Status: Time of last liquid consumption: 0000                        Time of last solid consumption: 0000                        Date of last liquid consumption: 08/05/18                        Date of last solid food consumption: 08/05/18    BMI:   Wt Readings from Last 3 Encounters:   08/04/18 240 lb (108.9 kg)   08/04/18 242 lb (109.8 kg)   06/14/18 215 lb 14.4 oz (97.9 kg)     Body mass index is 35.44 kg/m??.    CBC:   Lab Results   Component Value Date    WBC 4.7 08/05/2018    RBC 2.38 08/05/2018    RBC 5.17 05/31/2016    HGB 5.9 08/05/2018    HCT 18.1 08/05/2018    MCV 76.1 08/05/2018    RDW 21.4 08/05/2018    PLT 86 08/05/2018       CMP:   Lab Results   Component Value Date    NA 122 08/05/2018    K 4.0 08/05/2018  K 4.3 06/16/2018    CL 90 08/05/2018    CO2 23 08/05/2018    BUN 54 08/05/2018    CREATININE 1.26 08/05/2018    GFRAA >60.0 08/05/2018    AGRATIO 0.9 05/31/2016    LABGLOM >60.0 08/05/2018    GLUCOSE 92 08/05/2018    GLUCOSE 102 05/31/2016    PROT 5.1 08/05/2018    CALCIUM 7.8 08/05/2018    BILITOT 5.3 08/05/2018    ALKPHOS 181 08/05/2018    AST 144 08/05/2018    ALT 79 08/05/2018       POC Tests: No results for input(s):  POCGLU, POCNA, POCK, POCCL, POCBUN, POCHEMO, POCHCT in the last 72 hours.    Coags:   Lab Results   Component Value Date    PROTIME 20.3 08/04/2018    INR 1.7 08/04/2018    APTT 37.8 06/07/2018       HCG (If Applicable): No results found for: PREGTESTUR, PREGSERUM, HCG, HCGQUANT     ABGs: No results found for: PHART, PO2ART, PCO2ART, HCO3ART, BEART, O2SATART     Type & Screen (If Applicable):  No results found for: LABABO, [place]    Anesthesia Evaluation  Patient summary reviewed and Nursing notes reviewed no history of anesthetic complications:   Airway: Mallampati: II  TM distance: >3 FB   Neck ROM: full  Mouth opening: > = 3 FB Dental: normal exam         Pulmonary:normal exam  breath sounds clear to auscultation  (+) current smoker          Patient did not smoke on day of surgery.                 Cardiovascular:Negative CV ROS  Exercise tolerance: good (>4 METS),         ECG reviewed  Rhythm: regular  Rate: normal           Beta Blocker:  Not on Beta Blocker      ROS comment: Normal sinus rhythm  Incomplete right bundle branch block  Prolonged QT  Abnormal ECG  No previous ECGs available    Limited echo for evaluation for endocarditis.   No obvious valve vegetations or regurgitation.   Only fair quality study, could miss small vegetation.   Consider alternate modality if clinically indicated like TEE     Neuro/Psych:   Negative Neuro/Psych ROS              GI/Hepatic/Renal:   (+) GERD:, hepatitis: B and C, liver disease: coagulopathy from hepatic dysfunction, esophageal varices,           Endo/Other:    (+) blood dyscrasia: anemia and thrombocytopenia:., electrolyte abnormalities, .          Pt had PAT visit.       Abdominal:           Vascular: negative vascular ROS.                                       Anesthesia Plan      MAC     ASA 3       Induction: intravenous.    MIPS: Prophylactic antiemetics administered.  Anesthetic plan and risks discussed with patient.      Plan discussed with CRNA.    Attending  anesthesiologist reviewed and agrees with Pre Eval content  [place], DO   08/05/2018

## 2018-08-05 NOTE — Consults (Signed)
HPI:  Patient is a pleasant 48 year old man with alcoholic liver cirrhosis. He had a paracentesis for therapeutic purpose as an outpatient yesterday. He was realized to have low H&H and has been feeling worse than usual overall, and more lethargic. He was also not taking any of his medications, since he ran out. He decided that he would like to go to the ED after the procedure yesterday and ended up getting admitted. IR was consulted today to have a small diagnostic sample of ascites fluid. An ultrasound done at bedside in the ICU demonstrated recurrent fluid, enough for diagnostic sample.      Family History   Problem Relation Age of Onset   ??? Cancer Mother        Past Surgical History:   Procedure Laterality Date   ??? HERNIA REPAIR     ??? HERNIA REPAIR  07/09/2016   ??? KNEE SURGERY Right     arthroscopy    ??? PARACENTESIS Left 05/28/2018    total of 11,200 cc removed per Dr Floyde ParkinsWehbe   ??? PARACENTESIS Left 06/04/2018    Total of 11550ml per Dr. Floyde ParkinsWehbe   ??? PARACENTESIS Left 06/09/2018    9350cc removed by Dr Ranelle Oysterharles Christien Berthelot [phone]   ??? PARACENTESIS Left 06/12/2018    Dr Floyde ParkinsWehbe 5050 ml of fluid removed.   ??? PARACENTESIS Left 08/04/2018    12,400 ml of fluid off per Dr Floyde ParkinsWehbe   ??? PR LAP, INCISIONAL HERNIA REPAIR,REDUCIBLE N/A 07/09/2016    LAPAROSCOPIC INCISIONAL HERNIA REPAIR WITH MESH. POSS OPEN. [facility] A [facility]   ??? UPPER GASTROINTESTINAL ENDOSCOPY N/A 08/05/2018    [facility]icham [facility]        Past Medical History:   Diagnosis Date   ??? Cirrhosis (HCC)    ??? Drug abuse (HCC)    ??? Hepatitis B    ??? Hepatitis C        Social History     Socioeconomic History   ??? Marital status: Single     Spouse name: None   ??? Number of children: None   ??? Years of education: None   ??? Highest education level: None   Occupational History   ??? None   Social Needs   ??? Financial resource strain: None   ??? Food insecurity     Worry: None     Inability: None   ??? Transportation needs     Medical:  None     Non-medical: None   Tobacco Use   ??? Smoking status: Current Every Day Smoker     Packs/day: 0.50     Types: Cigarettes   ??? Smokeless tobacco: Never Used   Substance and Sexual Activity   ??? Alcohol use: No   ??? Drug use: No   ??? Sexual activity: None   Lifestyle   ??? Physical activity     Days per week: None     Minutes per session: None   ??? Stress: None   Relationships   ??? 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 Intimate partner violence     Fear of current or ex partner: None  Emotionally abused: None     Physically abused: None     Forced sexual activity: None   Other Topics Concern   ??? None   Social History Narrative   ??? None       Allergies   Allergen Reactions   ??? Nubain [Nalbuphine] Anaphylaxis and Hives   ??? Morphine Swelling   ??? Penicillin G Swelling     Throat swells   ??? Tylenol [Acetaminophen] Rash       No current facility-administered medications on file prior to encounter.      Current Outpatient Medications on File Prior to Encounter   Medication Sig Dispense Refill   ??? furosemide (LASIX) 40 MG tablet Take 1 tablet by mouth daily 60 tablet 3   ??? ferrous sulfate 325 (65 Fe) MG tablet Take 1 tablet by mouth daily (with breakfast) 30 tablet 3   ??? omeprazole (PRILOSEC) 20 MG delayed release capsule Take 2 capsules by mouth every morning (before breakfast) 30 capsule 3   ??? lidocaine 4 % external patch Place 3 patches onto the skin daily 90 patch 0   ??? lactulose encephalopathy 10 GM/15ML SOLN solution Take 20 g by mouth 3 times daily Indications: pt unsure of dose     ??? ipratropium-albuterol (DUONEB) 0.5-2.5 (3) MG/3ML SOLN nebulizer solution Inhale 1 vial into the lungs every 6 hours as needed for Shortness of Breath     ??? melatonin 3 MG TABS tablet Take 6 mg by mouth daily     ??? spironolactone  (ALDACTONE) 100 MG tablet Take 100 mg by mouth daily     ??? triamcinolone (KENALOG) 0.025 % cream APP EXT AA BID  0   ??? polyethylene glycol (GLYCOLAX) powder take 17GM (DISSOLVED IN WATER) by mouth once daily  0   ??? ondansetron (ZOFRAN) 4 MG tablet Take 4 mg by mouth Daily      ??? tenofovir (VIREAD) 300 MG tablet TK 1 T PO Daily  0       Review of Systems   Constitutional: Positive for appetite change and fatigue. Negative for chills, diaphoresis and fever.   HENT: Negative.  Negative for congestion, ear pain, hearing loss and tinnitus.    Eyes: Negative.  Negative for photophobia.   Respiratory: Negative.  Negative for cough, shortness of breath and wheezing.    Cardiovascular: Positive for leg swelling. Negative for chest pain and palpitations.   Gastrointestinal: Positive for abdominal distention. Negative for abdominal pain, constipation, diarrhea, nausea and vomiting.   Genitourinary: Positive for scrotal swelling. Negative for dysuria, flank pain, frequency, hematuria and urgency.   Musculoskeletal: Negative.  Negative for back pain and neck pain.   Skin: Negative.  Negative for rash.   Allergic/Immunologic: Negative for environmental allergies.   Neurological: Negative.  Negative for dizziness, tremors, weakness and headaches.   Hematological: Does not bruise/bleed easily.   Psychiatric/Behavioral: Negative.  Negative for hallucinations and suicidal ideas. The patient is not nervous/anxious.        OBJECTIVE:  BP (!) 122/58    Pulse 68    Temp 97.7 ??F (36.5 ??C) (Oral)    Resp 16    Ht 5\' 9"  (1.753 m)    Wt 245 lb 9.5 oz (111.4 kg)    SpO2 99%    BMI 36.27 kg/m??     Physical Exam  Constitutional:       General: He is not in acute distress.     Appearance: He is well-developed. He is not diaphoretic.  HENT:      Head: Normocephalic and atraumatic.      Nose: Nose normal.      Mouth/Throat:      Pharynx: No oropharyngeal exudate.   Eyes:      General: No scleral icterus.        Right eye: No discharge.          Left eye: No discharge.      Conjunctiva/sclera: Conjunctivae normal.   Neck:      Musculoskeletal: Neck supple.      Thyroid: No thyromegaly.      Vascular: No JVD.      Trachea: No tracheal deviation.   Cardiovascular:      Rate and Rhythm: Normal rate and regular rhythm.      Heart sounds: Normal heart sounds. No murmur. No friction rub. No gallop.    Pulmonary:      Effort: No respiratory distress.      Breath sounds: No stridor. No wheezing or rales.   Chest:      Chest wall: No tenderness.   Abdominal:      General: Bowel sounds are normal. There is distension.      Palpations: Abdomen is soft. There is no mass.      Tenderness: There is no abdominal tenderness. There is no guarding or rebound.      Hernia: No hernia is present.   Musculoskeletal:         General: Swelling present. No tenderness or deformity.   Skin:     General: Skin is dry.      Coloration: Skin is not pale.      Findings: No erythema or rash.   Neurological:      Mental Status: He is alert and oriented to person, place, and time.      Cranial Nerves: No cranial nerve deficit.   Psychiatric:         Behavior: Behavior normal.         Thought Content: Thought content normal.         Judgment: Judgment normal.         ASSESSMENT ANDPLAN:    Assessment: Recurrent ascites with Hx of alcoholic cirrhosis and declining medical condition, has not taken any of his medication recently since he ran out.     Plan: Diagnostic ultrasound guided paracentesis.

## 2018-08-05 NOTE — Progress Notes (Signed)
Patient ID:  Travis Ortega  [id number]  48 y.o.  1971-03-25  TAKEN TO ICU,   ATTACHED TO MONITOR AND REPORT GIVEN TO RN.   VSS      Electronically signed by Angelyn Punt, RN on 08/05/2018

## 2018-08-05 NOTE — Other (Signed)
Na 124 reported to hospitalist and Dr. Hipolito Bayley. Pt had EGD earlier today. This nurse noted NaCL 3% IVF discontinued shortly after. Q2Hr blood draw for Na level still ordered. Requesting order clarification. Awaiting response.

## 2018-08-05 NOTE — Progress Notes (Signed)
[facility] Occupational Therapy      Date: 08/05/2018  Patient Name: Travis Ortega        MRN: [id number]  Account: [id number]   DOB: 07/05/1970  (48 y.o.)  Room: IC07/IC07-01    Chart reviewed, attempted OT at 1400 for evaluation. Patient not seen 2?? to:    Pt. off floor for test/procedure    Spoke to RN Park Meo, RN aware. Will attempt again when able.    Electronically signed by Worthy Rancher Keiton Cosma, OTR/L on 08/05/2018 at 2:37 PM

## 2018-08-05 NOTE — Progress Notes (Signed)
Physical Therapy   Facility/Department: [facility] IC07/IC07-01    NAME: Travis Ortega    DOB: 1970-05-05 (48 y.o.)  MRN: [id number]    Account: [id number]  Gender: male    PT evaluation and treatment orders received. Chart reviewed. PT eval attempted.     Patient Unavailable: off unit, EGD    Will attempt PT evaluation again at earliest convenience.      Electronically signed by Eveline Keto, PT on 08/05/18 at 2:10 PM EDT

## 2018-08-05 NOTE — Op Note (Signed)
Endoscopy Note    Patient: Travis Ortega  DOB: [birth year]    Procedure: Esophagogastroduodenoscopy    Date:  08/05/2018     Surgeon:  Dalbert Garnet, MD    Preoperative Diagnosis: anemia    Postoperative Diagnosis: Erosive ulcerative esophagitis/esophageal varices/portal hypertensive gastropathy    Anesthesia: MAC    Indications: This is a 48 y.o. year old male who presents for upper endoscopy EGD patient with known history of cirrhosis.    Description of Procedure:  Informed consent was obtained from the patient after explanation of indications, benefits and possible risks and complications of the procedure.  The patient was then taken to the endoscopy suite, placed in the left lateral decubitus position and the above IV sedation was administrered.    The Olympus videoendoscope was placed in the patient's mouth and under direct visualization passed into the esophagus.  Visualization of the esophagus demonstrated multiple scattered ulceration in the distal esophagus.  Scarring in distal esophagus noted be related to previous banding.  Multiple mucosal breaks noted suggestive of at least LA grade C esophagitis.  3 columns of small esophageal varices ( deflate with insufflation) seen.  Largest distal esophageal ulceration was around 5 mm.   the diaphragmatic hiatus was noted at 38 cm, top of the gastric folds were noted at 38 cm from the incisors, Gastroesophageal junction was noted at 38 cm and  squamocolumnar junction was noted at 38 cm.  Irregular Z line.    No old or fresh blood identified     The scope was then advanced into the stomach.    Visualization of the gastric body and antrum demonstrated mosaic  appearance suggestive of portal hypertensive gastropathy.  A retroflexed exam of the gastric cardia and fundus demonstrated no gastric varices.  The pylorus was patent and the scope was advanced into the duodenum.  Visualization of the duodenal bulb and The second portion of the duodenum demonstrated normal  mucosa    The scope was then withdrawn back into the stomach, it was decompressed, and the scope was completely withdrawn.    The patient tolerated the procedure well and was taken to the post anesthesia care unit in good condition.    EBL: None  Complication: None  Specimen Sent: No    Impression:    Multiple distal esophageal ulcerations noted, largest around 5 mm clean-based  Erosive/ulcerative esophagitis  LA grade C esophagitis  Non-bleeding small esophageal varices   No Old or fresh blood seen at the time of this current exam    Recommendations:   Continue PPI  Continue octreotide for 72 hours  Continue to monitor H&H  Clear liquid diet  Further recommendation pending clinical course    Dalbert Garnet, MD  [facility]

## 2018-08-06 ENCOUNTER — Inpatient Hospital Stay: Admit: 2018-08-06 | Discharge: 2018-08-11 | Payer: MEDICARE | Primary: Family

## 2018-08-06 LAB — CELL COUNT WITH DIFFERENTIAL, BODY FLUID
Lymphocytes, Body Fluid: 2 %
Monocyte Count, Fluid: 1 %
Nucl Cell, Fluid: 308 /mm3
Number of Cells Counted Fluid: 100
RBC, Fluid: 214 /mm3

## 2018-08-06 LAB — CBC WITH AUTO DIFFERENTIAL
Atypical Lymphocytes Relative: 3 %
Basophils %: 0.1 %
Basophils Absolute: 0 10*3/uL (ref 0.0–0.2)
Eosinophils %: 0.4 %
Hematocrit: 20.4 % — CL (ref 42.0–52.0)
Hemoglobin: 6.7 g/dL — CL (ref 14.0–18.0)
Lymphocytes %: 7 %
Lymphocytes Absolute: 0.9 10*3/uL — ABNORMAL LOW (ref 1.0–4.8)
MCH: 26.1 pg — ABNORMAL LOW (ref 27.0–31.3)
MCHC: 33 % (ref 33.0–37.0)
MCV: 79.1 fL — ABNORMAL LOW (ref 80.0–100.0)
Monocytes %: 2.7 %
Monocytes Absolute: 0.3 10*3/uL (ref 0.2–0.8)
Neutrophils %: 87 %
Neutrophils Absolute: 7.8 K/uL — ABNORMAL HIGH (ref 1.4–6.5)
PLATELET SLIDE REVIEW: DECREASED
Platelets: 88 K/uL — ABNORMAL LOW (ref 130–400)
RBC: 2.58 M/uL — ABNORMAL LOW (ref 4.70–6.10)
RDW: 22.2 % — ABNORMAL HIGH (ref 11.5–14.5)
WBC: 9 10*3/uL (ref 4.8–10.8)

## 2018-08-06 LAB — TSH
TSH: 0.3 u[IU]/mL — ABNORMAL LOW (ref 0.440–3.860)
TSH: 0.279 uIU/mL — ABNORMAL LOW (ref 0.440–3.860)

## 2018-08-06 LAB — HEPATIC FUNCTION PANEL
ALT: 58 U/L — ABNORMAL HIGH (ref 0–41)
AST: 81 U/L — ABNORMAL HIGH (ref 0–40)
Albumin: 2.7 g/dL — ABNORMAL LOW (ref 3.5–4.6)
Alkaline Phosphatase: 145 U/L — ABNORMAL HIGH (ref 35–104)
Bilirubin, Direct: 3.4 mg/dL — ABNORMAL HIGH (ref 0.0–0.4)
Bilirubin, Indirect: 2.3 mg/dL — ABNORMAL HIGH (ref 0.0–0.6)
Total Bilirubin: 5.7 mg/dL — ABNORMAL HIGH (ref 0.2–0.7)
Total Protein: 4.9 g/dL — ABNORMAL LOW (ref 6.3–8.0)

## 2018-08-06 LAB — GLUCOSE, BODY FLUID: Glucose, Fluid: 130 mg/dL

## 2018-08-06 LAB — T4, FREE
T4 Free: 0.69 ng/dL — ABNORMAL LOW (ref 0.84–1.68)
T4 Free: 0.65 ng/dL — ABNORMAL LOW (ref 0.84–1.68)

## 2018-08-06 LAB — SODIUM, URINE, RANDOM
Sodium, Ur: <20 mEq/L
Sodium, Ur: <20 mEq/L
Sodium, Ur: <20 mEq/L

## 2018-08-06 LAB — MAGNESIUM: Magnesium: 2 mg/dL (ref 1.7–2.4)

## 2018-08-06 LAB — BASIC METABOLIC PANEL W/ REFLEX TO MG FOR LOW K
Anion Gap: 11 mEq/L (ref 9–15)
BUN: 39 mg/dL — ABNORMAL HIGH (ref 6–20)
CO2: 22 mEq/L (ref 20–31)
Calcium: 7.9 mg/dL — ABNORMAL LOW (ref 8.5–9.9)
Chloride: 93 mEq/L — ABNORMAL LOW (ref 95–107)
Creatinine: 1.2 mg/dL (ref 0.70–1.20)
GFR African American: >60 (ref >60)
GFR Non-African American: >60 (ref >60)
Glucose: 153 mg/dL — ABNORMAL HIGH (ref 70–99)
Potassium reflex Magnesium: 3.9 mEq/L (ref 3.4–4.9)
Sodium: 126 mEq/L — ABNORMAL LOW (ref 135–144)

## 2018-08-06 LAB — LACTATE DEHYDROGENASE, BODY FLUID: LD, Fluid: 53 U/L

## 2018-08-06 LAB — CREATININE, RANDOM URINE
Creatinine, Ur: 60.5 mg/dL
Creatinine, Ur: 71.2 mg/dL
Creatinine, Ur: 81.8 mg/dL

## 2018-08-06 LAB — HEMOGLOBIN AND HEMATOCRIT
Hematocrit: 21.5 % — ABNORMAL LOW (ref 42.0–52.0)
Hematocrit: 22.2 % — ABNORMAL LOW (ref 42.0–52.0)
Hemoglobin: 7.1 g/dL — ABNORMAL LOW (ref 14.0–18.0)
Hemoglobin: 7.2 g/dL — ABNORMAL LOW (ref 14.0–18.0)

## 2018-08-06 LAB — PROTIME-INR
INR: 1.8
Protime: 21.5 sec — ABNORMAL HIGH (ref 12.3–14.9)

## 2018-08-06 LAB — GRAM STAIN

## 2018-08-06 LAB — T3, FREE: T3, Free: 1.5 pg/mL — ABNORMAL LOW (ref 2.0–4.4)

## 2018-08-06 LAB — ALBUMIN, FLUID: Albumin, Fluid: <0.2 g/dL

## 2018-08-06 LAB — PROTEIN, BODY FLUID: Protein, Fluid: 0.4 g/dL

## 2018-08-06 MED ORDER — HYDROMORPHONE HCL 1 MG/ML IJ SOLN
1 MG/ML | INTRAMUSCULAR | Status: DC | PRN
Start: 2018-08-06 — End: 2018-08-06
  Administered 2018-08-06: 23:00:00 1 mg via INTRAVENOUS

## 2018-08-06 MED ORDER — LIDOCAINE HCL (PF) 1 % IJ SOLN
1 % | Freq: Once | INTRAMUSCULAR | Status: DC | PRN
Start: 2018-08-06 — End: 2018-08-06

## 2018-08-06 MED ORDER — SODIUM CHLORIDE 0.9 % IV BOLUS
0.9 % | Freq: Once | INTRAVENOUS | Status: AC
Start: 2018-08-06 — End: 2018-08-06
  Administered 2018-08-06: 12:00:00 20 mL via INTRAVENOUS

## 2018-08-06 MED ORDER — LIDOCAINE HCL 2 % IJ SOLN
2 % | Freq: Once | INTRAMUSCULAR | Status: AC | PRN
Start: 2018-08-06 — End: 2018-08-06
  Administered 2018-08-06: 17:00:00 7 via INTRADERMAL

## 2018-08-06 MED ORDER — SODIUM CHLORIDE 0.9 % IV SOLN
0.9 | INTRAVENOUS | Status: DC
Start: 2018-08-06 — End: 2018-08-06

## 2018-08-06 MED ORDER — NORMAL SALINE FLUSH 0.9 % IV SOLN
0.9 | Freq: Two times a day (BID) | INTRAVENOUS | Status: DC
Start: 2018-08-06 — End: 2018-08-06
  Administered 2018-08-06: 12:00:00 10 mL via INTRAVENOUS

## 2018-08-06 MED ORDER — NORMAL SALINE FLUSH 0.9 % IV SOLN
0.9 | INTRAVENOUS | Status: DC | PRN
Start: 2018-08-06 — End: 2018-08-06

## 2018-08-06 MED FILL — MELATONIN 1 MG PO TABS: 1 MG | ORAL | Qty: 2

## 2018-08-06 MED FILL — NICOTINE 7 MG/24HR TD PT24: 7 MG/24HR | TRANSDERMAL | Qty: 1

## 2018-08-06 MED FILL — OCTREOTIDE ACETATE 500 MCG/ML IJ SOLN: 500 MCG/ML | INTRAMUSCULAR | Qty: 1

## 2018-08-06 MED FILL — OXYCODONE HCL 5 MG PO TABS: 5 MG | ORAL | Qty: 1

## 2018-08-06 MED FILL — CIPROFLOXACIN IN D5W 400 MG/200ML IV SOLN: 400 MG/200ML | INTRAVENOUS | Qty: 200

## 2018-08-06 MED FILL — SODIUM CHLORIDE 0.9 % IV SOLN: 0.9 % | INTRAVENOUS | Qty: 1000

## 2018-08-06 MED FILL — SODIUM CHLORIDE 0.9 % IV SOLN: 0.9 % | INTRAVENOUS | Qty: 250

## 2018-08-06 MED FILL — MONOJECT FLUSH SYRINGE 0.9 % IV SOLN: 0.9 % | INTRAVENOUS | Qty: 10

## 2018-08-06 MED FILL — PROTONIX 40 MG IV SOLR: 40 MG | INTRAVENOUS | Qty: 40

## 2018-08-06 MED FILL — HYDROMORPHONE HCL 1 MG/ML IJ SOLN: 1 MG/ML | INTRAMUSCULAR | Qty: 1

## 2018-08-06 MED FILL — ALBUTEIN 25 % IV SOLN: 25 % | INTRAVENOUS | Qty: 100

## 2018-08-06 NOTE — Plan of Care (Signed)
I discussed Travis Ortega's case with Dr. Alric Quan. We will transfer him to [facility] main campus where he can also undergo in-patient transplant evaluation. This was discussed with the patient who agrees with this plan.     I have discussed his case with hepatology at [facility] and he has been accepted for transfer when a bed becomes available.     Mat Carne, DO

## 2018-08-06 NOTE — Plan of Care (Signed)
Ongoing

## 2018-08-06 NOTE — Progress Notes (Addendum)
Nephrology Progress Note    Assessment:  Hyponatremia improved slowly  GIB varices?  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 sodium chloride flush  10 mL Intravenous 2 times per day   ??? sodium chloride flush  10 mL Intravenous 2 times per day   ??? sodium chloride flush  10 mL Intravenous 2 times per day   ??? sodium chloride  20 mL Intravenous Once   ??? sodium chloride  1,000 mL Intravenous Once   ??? sodium chloride flush  10 mL Intravenous 2 times per day   ??? pantoprazole  40 mg Intravenous BID    And   ??? sodium chloride (PF)  10 mL Intravenous Daily   ??? nicotine  1 patch Transdermal Daily   ??? ciprofloxacin  400 mg Intravenous Q12H   ??? albumin human  25 g Intravenous Q8H     Continuous Infusions:  ??? sodium chloride     ??? sodium chloride 75 mL/hr at 08/06/18 1139   ??? octreotide (SANDOSTATIN) infusion 50 mcg/hr (08/06/18 0941)       CBC:   Recent Labs     08/05/18  1017  08/05/18  2306 08/06/18  0559   WBC 4.7*  --   --  9.0   HGB 5.9*   < > 7.1* 6.7*   PLT 86*  --   --  88*    < > = values in this interval not displayed.     CMP:    Recent Labs     08/04/18  1230   08/05/18  0830 08/05/18  1546 08/06/18  0600   NA 119*   < > 122* 124* 126*   K 4.8  --  4.0  --  3.9   CL 84*  --  90*  --  93*   CO2 23  --  23  --  22   BUN 75*  --  54*  --  39*   CREATININE 1.73*  --  1.26*  --  1.20   GLUCOSE 107*  --  92  --  153*   CALCIUM 8.1*  --  7.8*  --  7.9*   LABGLOM 42.8*  --  >60.0  --  >60.0    < > = values in this interval not displayed.     Troponin: No results for input(s): TROPONINI in the last 72 hours.  BNP:  No results for input(s): BNP in the last 72 hours.  INR:   Recent Labs     08/06/18  0600   INR 1.8     Lipids: No results for input(s): CHOL, LDLDIRECT, TRIG, HDL, AMYLASE, LIPASE in the last 72 hours.  Liver:   Recent Labs     08/06/18  0600   AST 81*   ALT 58*   ALKPHOS 145*   PROT 4.9*   LABALBU 2.7*   BILITOT 5.7*     Iron:  No results for input(s): IRONS, FERRITIN in the last 72 hours.    Invalid input(s): LABIRONS  Urinalysis: No results for input(s): UA in the last 72 hours.    Objective:  Vitals: BP 129/78    Pulse 65    Temp 97.7 ??F (36.5 ??C) (Oral)    Resp 16    Ht 5\' 9"  (1.753 m)    Wt 245 lb 9.5 oz (111.4 kg)    SpO2 100%    BMI 36.27 kg/m??    Wt Readings from Last 3 Encounters:   08/06/18 245 lb 9.5 oz (111.4 kg)   08/04/18 242 lb (109.8 kg)   06/14/18 215 lb 14.4 oz (97.9 kg)      24HR INTAKE/OUTPUT:      Intake/Output Summary (Last 24 hours) at 08/06/2018 1406  Last data filed at 08/06/2018 1139  Gross per 24 hour   Intake 4541.33 ml   Output 2970 ml   Net 1571.33 ml       General: alert, in no apparent distress  HEENT: normocephalic, atraumatic, anicteric  Neck: supple, no mass  Lungs: non-labored respirations, clear to auscultation bilaterally  Heart: regular rate and rhythm, no murmurs or rubs  Abdomen: soft, non-tender,  Moderate distended  Ext: no cyanosis, no peripheral edema  Neuro: alert and oriented, no gross abnormalities  Psych: normal mood and affect  Skin: no rash      Electronically signed by Birdena CrandallGeorge M Deoni Cosey, MD  Nephrology Progress  Note    Assessment:  Hyponatremia  Cirrhosis d/t ETOH  Anemia ? 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 sodium chloride flush  10 mL Intravenous 2 times per day   ??? sodium chloride flush  10 mL Intravenous 2 times per day   ??? sodium chloride flush  10 mL Intravenous 2 times per day   ??? sodium chloride  20  mL Intravenous Once   ??? sodium chloride  1,000 mL Intravenous Once   ??? sodium chloride flush  10 mL Intravenous 2 times per day   ??? pantoprazole  40 mg Intravenous BID    And   ??? sodium chloride (PF)  10 mL Intravenous Daily   ??? nicotine  1 patch Transdermal Daily   ??? ciprofloxacin  400 mg Intravenous Q12H   ??? albumin human  25 g Intravenous Q8H     Continuous Infusions:  ??? sodium chloride     ??? sodium chloride 75 mL/hr at 08/06/18 1139   ??? octreotide (SANDOSTATIN) infusion 50 mcg/hr (08/06/18 0941)       CBC:   Recent Labs     08/05/18  1017  08/05/18  2306 08/06/18  0559   WBC 4.7*  --   --  9.0   HGB 5.9*   < > 7.1* 6.7*   PLT 86*  --   --  88*    < > = values in this interval not displayed.     CMP:    Recent Labs     08/04/18  1230  08/05/18  0830 08/05/18  1546 08/06/18  0600    NA 119*   < > 122* 124* 126*   K 4.8  --  4.0  --  3.9   CL 84*  --  90*  --  93*   CO2 23  --  23  --  22   BUN 75*  --  54*  --  39*   CREATININE 1.73*  --  1.26*  --  1.20   GLUCOSE 107*  --  92  --  153*   CALCIUM 8.1*  --  7.8*  --  7.9*   LABGLOM 42.8*  --  >60.0  --  >60.0    < > = values in this interval not displayed.     Troponin: No results for input(s): TROPONINI in the last 72 hours.  BNP: No results for input(s): BNP in the last 72 hours.  INR:   Recent Labs     08/06/18  0600   INR 1.8     Lipids: No results for input(s): CHOL, LDLDIRECT, TRIG, HDL, AMYLASE, LIPASE in the last 72 hours.  Liver:   Recent Labs     08/06/18  0600   AST 81*   ALT 58*   ALKPHOS 145*   PROT 4.9*   LABALBU 2.7*   BILITOT 5.7*     Iron:  No results for input(s): IRONS, FERRITIN in the last 72 hours.    Invalid input(s): LABIRONS  Urinalysis: No results for input(s): UA in the last 72 hours.    Objective:  Vitals: BP 129/78    Pulse 65    Temp 97.7 ??F (36.5 ??C) (Oral)    Resp 16    Ht  (1.753 m)    Wt 245 lb 9.5 oz (111.4 kg)    SpO2 100%    BMI 36.27 kg/m??    Wt Readings from Last 3 Encounters:   08/06/18 245 lb 9.5 oz (111.4 kg)   08/04/18 242 lb (109.8 kg)   06/14/18 215 lb 14.4 oz (97.9 kg)      24HR INTAKE/OUTPUT:      Intake/Output Summary (Last 24 hours) at 08/06/2018 1406  Last data filed at 08/06/2018 1139  Gross per 24 hour   Intake 4541.33 ml   Output 2970 ml   Net 1571.33 ml  General: alert, in no apparent distress  HEENT: normocephalic, atraumatic, anicteric  Neck: supple, no mass  Lungs: non-labored respirations, clear to auscultation bilaterally  Heart: regular rate and rhythm, no murmurs or rubs  Abdomen: soft, non-tender, non-distended  Ext: no cyanosis, no peripheral edema  Neuro: alert and oriented, no gross abnormalities  Psych: normal mood and affect  Skin: no rash      Electronically signed by Salome Arnt, MD

## 2018-08-06 NOTE — Discharge Summary (Signed)
[facility] Medicine Discharge Summary    Travis Ortega  DOB:  June 10, 1970  MRN:  [id number]    Admit date:  08/04/2018  Date of Transfer to [facility]:  08/06/2018    Admitting Physician:  Lowry Bowl, DO  Primary Care Physician:  Yaakov Guthrie, APRN - CNP    Discharge Diagnoses:    Principal Problem:    Acute blood loss anemia  Active Problems:    Cirrhosis (HCC)    Abdominal pain    Ascites of liver    SBP (spontaneous bacterial peritonitis) (HCC)    Esophagitis, [place] grade C    Esophageal ulceration    Upper GI bleed    Acute kidney injury (HCC)    Hyponatremia    Abnormal thyroid function test    Chronic hepatitis C without hepatic coma (HCC)    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 *    Chief Complaint   Patient presents with   ??? Fatigue     Patient brought from specials for fatigue       Condition: improved  Activity: no strenuous activity   Diet: salt and fluid restriction  Disposition: transfer to [facility]  Functional Status: ambulatory    Significant Findings:     Recent Labs     08/06/18  0559 08/05/18  2306 08/05/18  1547 08/05/18  1017  08/04/18  1230   WBC 9.0  --   --  4.7*  --  7.3   HGB 6.7* 7.1* 7.5* 5.9*   < > 3.8*   HCT 20.4* 21.5* 22.6* 18.1*   < > 11.5*   MCV 79.1*  --   --  76.1*  --  68.0*   PLT 88*  --   --  86*  --  131    < > = values in this interval not displayed.     Labs Renal Latest Ref Rng & Units 08/06/2018 08/05/2018 08/05/2018 08/05/2018 08/04/2018   BUN 6 - 20 mg/dL 09(G) - 28(Z) - 66(QH)   Cr 0.70 - 1.20 mg/dL 4.76 - 5.46(T) - 0.35(W)   K 3.4 - 4.9 mEq/L 3.9 - 4.0 - 4.8   Na 135 - 144 mEq/L 126(L) 124(L) 122(L) 123(L) 119(LL)     Lab Results   Component Value Date    ALT 58 (H) 08/06/2018    AST 81 (H) 08/06/2018    ALKPHOS 145 (H) 08/06/2018    BILITOT 5.7 (H) 08/06/2018     Lab Results   Component Value Date    INR 1.8 08/06/2018    INR 1.7 08/04/2018     INR 1.3 06/07/2018    PROTIME 21.5 (H) 08/06/2018    PROTIME 20.3 (H) 08/04/2018    PROTIME 16.6 (H) 06/07/2018     4/22 Cell Count (ascitic fluid): 308 cells with 97% neutrophils. Gram stain: moderate wbc, no organisms seen. Culture in process    TFTs: TSH 0.3 (L), Free T4 0.69 (L), Free T3 1.5 (L). Repeat levels and thyroid peroxidase antibody and thyroid stimulating immunoglobulin pending.    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 Small varices were noted. On admission, he also had significant AKI and hyponatremia. He was treated with IV PPI, octreotide infusion, and supportive transfusions. He was also treated with some IVF, albumin, and 3% NS given with blood transfusions per nephrology. He was started on IV ciprofloxacin for prophylaxis on admission and on 4/22, was found to have SBP based on data from paracentesis- 5L fluid was also removed from this tap to relieve abdominal pain.     Of note, abnormal thyroid function tests were found- endocrinology had repeated these tests as mentioned above- these are pending at the time of dishcharge.    [facility] to continue medical treatment and undergo inpatient transplant evaluation. MELD 28 at time of transfer.     Patient was hemodynamically stable. Recheck hemoglobin is pending. He is  having significant abdominal pain requiring oxycodone 5mg  q4h.     Key medications at time of transfer:  - IV protonix 40mg  bid, octreotide gtt (both started 4/20 @1430 )  - IV ciprofloxacin 400mg  bid (started 4/20 @ 1623)  - patient has received 25g albumin q8h since 4/20 @ 1753  - oxycodone 5mg  q4h prn for abdominal pain    Exam on Discharge:  BP 135/60    Pulse 67    Temp 97.5 ??F (36.4 ??C) (Oral)    Resp 16    Ht 5\' 9"  (1.753 m)    Wt 245 lb 9.5 oz (111.4 kg)    SpO2 100%    BMI 36.27 kg/m??   General appearance: alert, cooperative and no distress  Mental Status: oriented to person, place and time and normal affect. No asterixis  Lungs: clear to auscultation bilaterally, normal effort  Heart: regular rate and rhythm, no murmur  Abdomen: distended. Diffusely tender to palpation with rebound tenderness present.  Extremities: 1+ pitting edema in distal lower extremities bilaterally    DC time 37 minutes    Signed:  Lowry Bowleal R Evert Wenrich  08/06/2018, 5:35 PM

## 2018-08-06 NOTE — Progress Notes (Signed)
Vascular and Interventional Radiology   Carin Hock. Floyde Parkins M.D. Joycelyn Das      Subjective: Travis Ortega is a 48 y.o. male with alcoholic liver cirrhosis. Admitted due to anemia. Had scoping yesterday. Needs diagnostic paracentesis today.     Objective:   BP (!) 124/59    Pulse 70    Temp 98.2 ??F (36.8 ??C)    Resp 16    Ht 5\' 9"  (1.753 m)    Wt 245 lb 9.5 oz (111.4 kg)    SpO2 100%    BMI 36.27 kg/m??     Physical:   alert and  not in acute distress    Normocephalic, without obvious abnormality, atraumatic    Neck supple. No adenopathy. Thyroid symmetric, normal size, and without nodularity    normal rate, regular rhythm and no murmurs    chest clear, no wheezing, rales, normal symmetric air entry    Abdomen: Mild distention     Lab:  Recent Labs     08/06/18  0559 08/06/18  1800   WBC 9.0  --    RBC 2.58*  --    HGB 6.7* 7.2*   HCT 20.4* 22.2*   MCV 79.1*  --    MCH 26.1*  --    MCHC 33.0  --    RDW 22.2*  --    PLT 88*  --        Recent Labs     08/06/18  0600   INR 1.8   PROTIME 21.5*       Recent Labs     08/05/18  0830 08/06/18  0600   CREATININE 1.26* 1.20       Assessment: Travis Ortega is a 48 y.o. male with recurrent ascites due to alcoholic liver cirrhosis    Plan: Diagnostic and therapeutic paracentesis, up to 5 liters, per requesting hospitalist.     Carin Hock. Floyde Parkins M.D. Joycelyn Das  08/07/2018,  5:26 PM

## 2018-08-06 NOTE — Progress Notes (Signed)
[facility]CCUP[facility]ERAPY EVALUATION - ACUTE     NAME: Travis Ortega  DOB: 1970/06/07 (48 y.o.)  MRN: [id number]  CODE STATUS: Full Code  Room: V409/W119-14    Date of Service: 08/06/2018    Patient Diagnosis(es): Acute blood loss anemia [D62]   Chief Complaint   Patient presents with   . Fatigue     Patient brought from specials for fatigue     Patient Active Problem List    Diagnosis Date Noted   . Acute blood loss anemia 08/04/2018   . MSSA bacteremia 06/11/2018   . Osteomyelitis of cervical vertebra (HCC) 06/11/2018   . Cervicalgia 06/11/2018   . 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 Degeneration of thoracic intervertebral disc 06/11/2018   . Degeneration of lumbar intervertebral disc 06/11/2018   . Muscle weakness of upper extremity 06/11/2018   . Abdominal pain 06/08/2018   . Ascites of liver 06/08/2018   . Visceral hyperalgesia 06/08/2018   . Generalized muscle ache 06/08/2018   . Cirrhosis (HCC) 06/07/2018   . Recurrent incisional hernia         Past Medical History:   Diagnosis Date   . Cirrhosis (HCC)    . Drug abuse (HCC)    . Hepatitis B    . Hepatitis C      Past Surgical History:   Procedure Laterality Date   . HERNIA REPAIR     . HERNIA REPAIR  07/09/2016   . KNEE SURGERY Right     arthroscopy    . PARACENTESIS Left 05/28/2018    total of 11,200 cc removed per Dr Floyde Parkins   . PARACENTESIS Left 06/04/2018    Total of per Dr. Floyde Parkins   . PARACENTESIS Left 06/09/2018    9350cc removed by Dr Ranelle Oyster [phone]   . PARACENTESIS Left 06/12/2018    Dr Floyde Parkins 5050 ml of fluid removed.   Marland Kitchen PARACENTESIS Left 08/04/2018    12,400 ml of fluid off per Dr Floyde Parkins   . PR LAP, INCISIONAL HERNIA REPAIR,REDUCIBLE N/A 07/09/2016    LAPAROSCOPIC INCISIONAL HERNIA REPAIR WITH MESH. POSS OPEN. performed by Loreli Slot, MD at  [facility] OR   . UPPER GASTROINTESTINAL ENDOSCOPY N/A 08/05/2018    EGD performed by Thamas Jaegers, MD at [facility] OR        Restrictions  Restrictions/Precautions: Fall Risk     Safety Devices: Safety Devices  Safety Devices in place: Yes  Type of devices: All fall risk precautions in place    Subjective  Pre Treatment Pain Screening  Pain at present: 10(pt not in tears)  Scale Used: Numeric Score  Intervention List: Nurse/Physician notified  Comments / Details: Per RN, pt had pain medication 1 hr ago.     Pain Reassessment:   Pain Assessment  Pain Assessment: 0-10  Pain Level: 10  Pain Location: Abdomen       Prior Level of Function:  Social/Functional History  Lives With: (sister )  Type of Home: Apartment(2nd floor )  Home Layout: One level(flight of stairs to pt floor )  Home Access: Stairs to enter with rails  Entrance Stairs - Number of Steps: 3  Entrance Stairs - Rails: Both  Bathroom Shower/Tub: Pension scheme manager: Midwife: Paediatric nurse  ADL Assistance: Independent  Homemaking Assistance: (sister cooks  and cleans )  Ambulation Assistance: Independent  Transfer Assistance: Independent  Active Driver: Yes    OBJECTIVE:     Orientation Status:  Orientation  Overall Orientation Status: Within Functional Limits    Observation:  Observation/Palpation  Observation: Pt recieving blood, OK to see Per RN Misty Stanley. Room dark, no acute signs of distress.     Cognition Status:  Cognition  Overall Cognitive Status: WFL    Perception Status:  Perception  Overall Perceptual Status: WFL    Sensation Status:  Sensation  Overall Sensation Status: Impaired(BLE n/t per pt report (pt stated has had for 6 months))    Vision and Hearing Status:  Vision  Vision: Within Functional Limits  Hearing  Hearing: Within functional limits     ROM:   LUE AROM (degrees)  LUE AROM : WFL  Left Hand AROM (degrees)  Left Hand AROM: WFL  RUE AROM (degrees)  RUE AROM : WFL  Right Hand AROM (degrees)  Right Hand AROM:  WFL    Strength:  LUE Strength  L Hand General: 4-/5  LUE Strength Comment: 4-/5  RUE Strength  R Hand General: 4-/5  RUE Strength Comment: 4-/5    Coordination, Tone, Quality of Movement:   Tone RUE  RUE Tone: Normotonic  Tone LUE  LUE Tone: Normotonic  Coordination  Movements Are Fluid And Coordinated: No  Coordination and Movement description: Decreased speed, Right UE, Left UE    Hand Dominance:  Hand Dominance  Hand Dominance: Right    ADL Status:  ADL  Feeding: (pt able to drink from cup )  Grooming: Setup  UE Bathing: Setup  LE Bathing: Contact guard assistance  UE Dressing: Setup  LE Dressing: Contact guard assistance(pt able to doff/don sock seated EOB, CGA simulating pants management (pt with sway))  Toileting: Contact guard assistance  Additional Comments: ADL's simulated unless otherwise stated   Toilet Transfers  Toilet Transfer: Unable to assess  Toilet Transfers Comments: anticipated CGA       Therapy key for assistance levels -   Independent = Pt. is able to perform task with no assistance but may require a device   Stand by assistance = Pt. does not perform task at an independent level but does not need physical assistance, requires verbal cues  Minimal, Moderate, Maximal Assistance = Pt. requires physical assistance (25%, 50%, 75% assist from helper) for task but is able to actively participate in task   Dependent = Pt. requires total assistance with task and is not able to actively participate with task completion     Functional Mobility:  Functional Mobility  Functional - Mobility Device: No device  Assist Level: Contact guard assistance  Functional Mobility Comments: Pt with 3 steps away and to bed. Pt reported being nauseous.   Transfers  Sit to stand: Contact guard assistance  Stand to sit: Contact guard assistance    Bed Mobility  Bed mobility  Supine to Sit: Stand by assistance  Sit to Supine: Stand by assistance    Seated and Standing Balance:  Balance  Sitting Balance:  Supervision  Standing Balance: Contact guard assistance(pt with sway )    Functional Endurance:  Activity Tolerance  Activity Tolerance: Fair, pt reported nausea     D/C Recommendations:  OT D/C RECOMMENDATIONS  REQUIRES OT FOLLOW UP: Yes    Equipment Recommendations:  OT Equipment Recommendations  Other: Continue to assess     OT Education:   OT Education  OT Education: OT Role, Plan of  Care    OT Follow Up:  OT D/C RECOMMENDATIONS  REQUIRES OT FOLLOW UP: Yes       Assessment/Discharge Disposition:  Assessment: Pt is a 48 y.o. male with the above mentioned deficits impairing ability to complete ADL's at reported baseline IND level. Pt may benefit from OT services to address deficits and return to highest level of function.   Performance deficits / Impairments: Decreased functional mobility , Decreased ADL status, Decreased strength, Decreased balance, Decreased high-level IADLs, Decreased coordination, Decreased endurance  Prognosis: Good  Discharge Recommendations: Continue to assess pending progress  Decision Making: Medium Complexity  History: Multi comorb   Exam: 7 perf imp   Assistance / Modification: CGA    Six Click Score   How much help for putting on and taking off regular lower body clothing?: A Little  How much help for Bathing?: A Little  How much help for Toileting?: A Little  How much help for putting on and taking off regular upper body clothing?: A Little  How much help for taking care of personal grooming?: A Little  How much help for eating meals?: None  AM-PAC Inpatient Daily Activity Raw Score: 19  AM-PAC Inpatient ADL T-Scale Score : 40.22  ADL Inpatient CMS 0-100% Score: 42.8    Plan:  Plan  Times per week: 1-3  Plan weeks: LOS  Current Treatment Recommendations: Strengthening, Location manager, Building services engineer, Teaching laboratory technician, Self-Care / ADL, Equipment Evaluation, Education, Sports administrator, Equities trader, Home Management Training    Goals:   Patient  will:    - Improve functional endurance to tolerate/complete 45-60 mins of ADL's  - Be IND in UB ADLs   - Be IND in LB ADLs  - Be IND in ADL transfers without LOB  - Be IND in toileting tasks  - Improve B UE strength and endurance to increase by 1/2 MMT grade in order to participate in self-care activities as projected.  - Access appropriate D/C site with as few architectural barriers as possible.    Patient Goal:    home   Discussed and agreed upon: Yes Comments:    Therapy Time:   OT Individual Minutes  Time In: 1109  Time Out: 1130  Minutes: 21    Eval: 21 minutes     Electronically signed by:    Vena Rua, OTR/L  08/06/2018, 11:32 AM

## 2018-08-06 NOTE — Plan of Care (Signed)
Problem: Daily Care:  Goal: Daily care needs are met  Description: Daily care needs are met  Outcome: Ongoing     Problem: Pain:  Description: Pain management should include both nonpharmacologic and pharmacologic interventions.  Goal: Patient's pain/discomfort is manageable  Description: Patient's pain/discomfort is manageable  Outcome: Ongoing  Goal: Pain level will decrease  Description: Pain level will decrease  Outcome: Ongoing  Goal: Control of acute pain  Description: Control of acute pain  Outcome: Ongoing  Goal: Control of chronic pain  Description: Control of chronic pain  Outcome: Ongoing     Problem: Falls - Risk of:  Goal: Will remain free from falls  Description: Will remain free from falls  Outcome: Ongoing  Goal: Absence of physical injury  Description: Absence of physical injury  Outcome: Ongoing     Problem: Bleeding:  Goal: Will show no signs and symptoms of excessive bleeding  Description: Will show no signs and symptoms of excessive bleeding  Outcome: Ongoing       POC updated and ongoing

## 2018-08-06 NOTE — Other (Signed)
Ultrasound called and notified of nursing communication placed in chart by Dr. Pricilla Riffle. Electronically signed by Holley Raring, RN on 08/06/2018 at 12:54 PM

## 2018-08-06 NOTE — Progress Notes (Signed)
2300- Lab sample sent for H & H.  Pt resting in bed.  No s/s distress noted at this time.  Pt requesting popsicle.  Educated about no red food coloring allowed.  Green popsicle given.  Pt verbalizes understanding.      0050- CNP Nicole notified of H&H results.  No new orders.

## 2018-08-06 NOTE — Other (Signed)
Report called to [facility] at [facility] - Pt scheduled for 8pm pick up via [facility] - going to G100 Bed #15. Electronically signed by Holley Raring, RN on 08/06/2018 at 6:52 PM

## 2018-08-06 NOTE — Other (Addendum)
NO SEDATION    U/S tech obtained images prior to start of procedure using U/S. P  VSS.      1312 Procedure explained, consent signed.  Timeout completed. Using U/S, Dr. Floyde Parkins marked, prepped LLQ with Chloraprep and draped with sterile drape and towels.    1315 Site numbed with lidocaine 2% 7 ml.  One1step Centesis 48F catheter placed using Ultrasound guidance.  Fluid appears clear yellow. Catheter tubing connected to Neptune machine to remove fluid.  Nursing communication order noted that permits up to 5 Liter ascites removal. Fluid removal limitation due to AKI.     1336 Pt resting with eyes closed and lights down, VSS.  Still draining, so far 4 Liter removed.  Will stop at 5 liters as ordered.     1342 Pt tolerated well. Total 5000 ml removed. Catheter removed, pressure held and gauze/tegaderm applied as pressure dressing to avoid leaking since patient still has moderate amount of ascites.   Verbal and tactile reassurance given throughout.       1400 Report given to [employer].  Transport requested.  VSS.

## 2018-08-06 NOTE — Progress Notes (Addendum)
Gastroenterology Progress Note    Travis Ortega is a 48 y.o. male patient.  Hospitalization Day:2    Chief C/O: GIB    SUBJECTIVE: Patient was seen and examined on the regular medical floor, he is hemodynamically stable, has had no further active GI bleeding, hemoglobin is 6.7 after 5 units of packed red blood cells, currently has 1 unit infusing.  He is status post EGD yesterday that showed no evidence of active GI bleeding, but LA Grade C erosive ulcerative esophagitis/nonbleeding small esophageal varices portal hypertensive gastropathy.     ROS:  Gastrointestinal ROS: no abdominal pain, change in bowel habits, or black or bloody stools    Physical    VITALS:  BP (!) 117/56    Pulse 75    Temp 97.9 ??F (36.6 ??C) (Oral)    Resp 16    Ht 5\' 9"  (1.753 m)    Wt 245 lb 9.5 oz (111.4 kg)    SpO2 100%    BMI 36.27 kg/m??   TEMPERATURE:  Current - Temp: 97.9 ??F (36.6 ??C); Max - Temp  Avg: 98.2 ??F (36.8 ??C)  Min: 97.8 ??F (36.6 ??C)  Max: 99.5 ??F (37.5 ??C)    General:  Alert and oriented,  No apparent distress  Skin- without jaundice  Eyes: anicteric sclera  Cardiac: RRR, Nl s1s2, without murmurs  Lungs CTA Bilaterally, normal effort  Abdomen soft, ND, NT, no HSM, Bowel sounds normal  Ext: without edema  Neuro: no asterixis     Data    Data Review:    Recent Labs     08/04/18  1230  08/05/18  1017 08/05/18  1547 08/05/18  2306 08/06/18  0559   WBC 7.3  --  4.7*  --   --  9.0   HGB 3.8*   < > 5.9* 7.5* 7.1* 6.7*   HCT 11.5*   < > 18.1* 22.6* 21.5* 20.4*   MCV 68.0*  --  76.1*  --   --  79.1*   PLT 131  --  86*  --   --  88*    < > = values in this interval not displayed.     Recent Labs     08/04/18  1230  08/05/18  0830 08/05/18  1546 08/06/18  0600   NA 119*   < > 122* 124* 126*   K 4.8  --  4.0  --  3.9   CL 84*  --  90*  --  93*   CO2 23  --  23  --  22   BUN 75*  --  54*  --  39*   CREATININE 1.73*  --  1.26*  --  1.20    < > = values in this interval not displayed.     Recent Labs     08/04/18  1230 08/05/18  0830  08/06/18  0600   AST 231* 144* 81*   ALT 116* 79* 58*   BILIDIR  --   --  3.4*   BILITOT 2.2* 5.3* 5.7*   ALKPHOS 250* 181* 145*     No results for input(s): LIPASE, AMYLASE in the last 72 hours.  Recent Labs     08/04/18  1230 08/06/18  0600   PROTIME 20.3* 21.5*   INR 1.7 1.8           ASSESSMENT:  48 y/o male admitted with GIB s/p egd notable for LA grade C erosive ulcerative esophagitis, small esophageal varices,  and portal HTN gastropathy- no active bleeding.     PLAN :  1.  Continue PPI, continue octreotide for 72 hours total  2. Serial H&H and transfuse as needed to keep hemoglobin  >7  3.  Ok to Advance diet to full liquid   4.  Referral/resuming follow-up with [facility] transplant center.-Attempt to reach transplant center accordingly.  5. tenofovir 300 mg is not available for in-patients, he will need to take his own supply.   Thank you for allowing me to participate in the care of your patient.  Please feel free to contact me with any concerns.    Fransisca Kaufmann, APRN - CNP

## 2018-08-06 NOTE — Progress Notes (Signed)
Pt picked up by [facility] to be transported to [facility] at 2030. Pt left in stable condition with all personal belongings.

## 2018-08-06 NOTE — Consults (Signed)
INITIAL CONSULT -PAIN MANAGEMENT     SERVICE DATE:  08/06/2018   SERVICE TIME:  6:46 PM  Admission date 08/04/2018  REASON FORCONSULT: Severe intractable abdominal pain and generalized pain  REQUESTING PHYSICIAN: Dr. Pricilla Riffle   PRIMARY CARE PHYSICIAN:Christopher Berneice Gandy, APRN - CNP    Chief Complaint   Patient presents with   . Fatigue     Patient brought from specials for fatigue         HISTORY OF PRESENTILLNESS:  Travis Ortega is a 48 y.o. male who presents for extreme fatigue, throwing block,    He has very much lower hemoglobin due to upper GI bleeding.    Have extensive history of alcoholic liver cirrhosis, several paracentesis done as an outpatient, developed severe lower H&H become very lethargic after left his nursing home AMA, he came to the ER and he got admitted.  He was consulted by nephrology due to lower urine output as well as decreased renal function patient also been seen by other specialties including (radiology to perform paracentesis.    Continue to complain of severe intractable pain, describing the pain is sharp severe, not able to take a deep breath, is going to be transferred to [facility] for further consideration of treatment.    PAIN  ASSESSMENT:    constant    aching, stabbing, throbbing and tight band    pain is excruciating , incapacitating and unbearable (9-10 pain scale)    PAST MEDICAL HISTORY:    Past Medical History:   Diagnosis Date   . Cirrhosis (HCC)    . Drug abuse (HCC)    . Hepatitis B    . Hepatitis C      PAST SURGICAL HISTORY:    Past Surgical History:   Procedure Laterality Date   . HERNIA REPAIR     . HERNIA REPAIR  07/09/2016   . KNEE SURGERY Right     arthroscopy    . PARACENTESIS Left 05/28/2018    total of 11,200 cc removed per Dr Floyde Parkins   . PARACENTESIS Left 06/04/2018    Total of per Dr. Floyde Parkins   . PARACENTESIS Left 06/09/2018    9350cc removed by Dr Ranelle Oyster [id number]   . PARACENTESIS Left 06/12/2018    Dr Floyde Parkins 5050 ml of fluid removed.   Marland Kitchen  PARACENTESIS Left 08/04/2018    12,400 ml of fluid off per Dr Floyde Parkins   . PARACENTESIS Left 08/06/2018    removed for diagnostics (still had moderate fluid left but not removed per MD due to AKI) by Dr Floyde Parkins   . PR LAP, INCISIONAL HERNIA REPAIR,REDUCIBLE N/A 07/09/2016    LAPAROSCOPIC INCISIONAL HERNIA REPAIR WITH MESH. POSS OPEN. performed by Loreli Slot, MD at [facility] OR   . UPPER GASTROINTESTINAL ENDOSCOPY N/A 08/05/2018    EGD performed by Thamas Jaegers, MD at [facility] OR     FAMILY HISTORY:    Family History   Problem Relation Age of Onset   . Cancer Mother      SOCIALHISTORY:    Social History     Socioeconomic History   . Marital status: Single     Spouse name: Not on file   . Number of children: Not on file   . Years of education: Not on file   . Highest education level: Not on file   Occupational History   . Not on file   Social Needs   . Financial resource strain: Not on file   .  Food insecurity     Worry: Not on file     Inability: Not on file   . Transportation needs     Medical: Not on file     Non-medical: Not on file   Tobacco Use   . Smoking status: Current Every Day Smoker     Packs/day: 0.50     Types: Cigarettes   . Smokeless tobacco: Never Used   Substance and Sexual Activity   . Alcohol use: No   . Drug use: No   . Sexual activity: Not on file   Lifestyle   . Physical activity     Days per week: Not on file     Minutes per session: Not on file   . Stress: Not on file   Relationships   . Social Wellsite geologistconnections     Talks on phone: Not on file     Gets together: Not on file     Attends religious service: Not on file     Active member of club or organization: Not on file     Attends meetings of clubs or organizations: Not on file     Relationship status: Not on file   . Intimate partner violence     Fear of current or ex partner: Not on file     Emotionally abused: Not on file     Physically abused: Not on file     Forced sexual activity: Not on file   Other Topics Concern   . Not on file   Social  History Narrative   . Not on file     PSYCHOLOGICAL HISTORY: History of alcohol abuse, chronic anxiety.    MEDICATIONS:  Medications Prior to Admission: furosemide (LASIX) 40 MG tablet, Take 1 tablet by mouth daily  ferrous sulfate 325 (65 Fe) MG tablet, Take 1 tablet by mouth daily (with breakfast)  omeprazole (PRILOSEC) 20 MG delayed release capsule, Take 2 capsules by mouth every morning (before breakfast)  lidocaine 4 % external patch, Place 3 patches onto the skin daily  lactulose encephalopathy 10 GM/15ML SOLN solution, Take 20 g by mouth 3 times daily Indications: pt unsure of dose  ipratropium-albuterol (DUONEB) 0.5-2.5 (3) MG/3ML SOLN nebulizer solution, Inhale 1 vial into the lungs every 6 hours as needed for Shortness of Breath  melatonin 3 MG TABS tablet, Take 6 mg by mouth daily  spironolactone (ALDACTONE) 100 MG tablet, Take 100 mg by mouth daily  triamcinolone (KENALOG) 0.025 % cream, APP EXT AA BID  polyethylene glycol (GLYCOLAX) powder, take 17GM (DISSOLVED IN WATER) by mouth once daily  ondansetron (ZOFRAN) 4 MG tablet, Take 4 mg by mouth Daily   tenofovir (VIREAD) 300 MG tablet, TK 1 T PO Daily  @IPMED @    ALLERGIES:  Nubain [nalbuphine]; Morphine; Penicillin g; and Tylenol [acetaminophen]    COMPLETE REVIEW OF SYSTEMS:  As noted in HPI, 12 point ROS reviewed and otherwise negative.  Review of Systems   Constitutional: Positive for activity change, appetite change, diaphoresis and fatigue. Negative for chills, fever and unexpected weight change.   HENT: Negative.    Eyes: Negative.    Respiratory: Positive for chest tightness and shortness of breath. Negative for apnea, cough, choking, wheezing and stridor.    Cardiovascular: Positive for leg swelling. Negative for chest pain and palpitations.   Gastrointestinal: Positive for abdominal distention, abdominal pain, blood in stool, constipation, nausea and rectal pain. Negative for anal bleeding, diarrhea and vomiting.   Endocrine: Negative.  Genitourinary: Positive for decreased urine volume, difficulty urinating and flank pain. Negative for discharge, dysuria, enuresis, frequency, genital sores, hematuria, penile pain, penile swelling, scrotal swelling, testicular pain and urgency.   Musculoskeletal: Positive for arthralgias, back pain, gait problem, joint swelling, myalgias, neck pain and neck stiffness.   Skin: Negative.    Allergic/Immunologic: Negative.    Neurological: Positive for weakness and numbness. Negative for dizziness, tremors, seizures, syncope, facial asymmetry, speech difficulty, light-headedness and headaches.   Psychiatric/Behavioral: Positive for agitation, behavioral problems, decreased concentration, dysphoric mood and sleep disturbance. Negative for confusion, self-injury and suicidal ideas. The patient is nervous/anxious. The patient is not hyperactive.          OBJECTIVE  PHYSICAL EXAM:  BP 135/60   Pulse 67   Temp 97.5 F (36.4 C) (Oral)   Resp 16   Ht  (1.753 m)   Wt 245 lb 9.5 oz (111.4 kg)   SpO2 100%   BMI 36.27 kg/m   Body mass index is 36.27 kg/m.  CONSTITUTIONAL: To be overall lethargic appears to be distressed due to the pain  EYES: Patient is intact however he is jaundiced, icteric  ENT:  normocepalic, without obvious abnormality, atraumatic  NECK: Limited range of motion due to body habitus, supple, symmetrical, trachea midline, skin normal and no stridor  BACK: Moderately limited and moderately painful range of motion due to large abdominal girth  LUNGS:  tachypneic, Mild respiratory distress and Mechanical restriction of his air exchange  CARDIOVASCULAR:  tachycardic with regular rhythm  ABDOMEN: Very large distended abdominal girth, large abdominal contour, severe tenderness across the whole abdominal region  MUSCULOSKELETAL:  Very restricted range of motion of all upper lower extremity joints, swelling upper both lower extremity, swelling all over.,  Generalized anasarca.  there is no redness,  warmth, or swelling of the joints  NEUROLOGIC: Mental status seem to be weak, lethargic, but responsive.  Symmetrical upper lower extremities.  SKIN: Icteric, no active bleeding in the skin    DATA:   Diagnostic tests reviewed for today's visit:    All labs and imaging results reviewed.     Lab Results   Component Value Date    WBC 9.0 08/06/2018    HGB 6.7 08/06/2018    HCT 20.4 08/06/2018    MCV 79.1 08/06/2018    PLT 88 08/06/2018    NA 126 08/06/2018    K 3.9 08/06/2018    CL 93 08/06/2018    CO2 22 08/06/2018    BUN 39 08/06/2018    CREATININE 1.20 08/06/2018    CALCIUM 7.9 08/06/2018    ALKPHOS 145 08/06/2018    ALT 58 08/06/2018    AST 81 08/06/2018    BILITOT 5.7 08/06/2018    BILIDIR 3.4 08/06/2018    LABALBU 2.7 08/06/2018    LABALBU 3.1 05/31/2016       Xr Chest Portable    Result Date: 08/04/2018  EXAMINATION: XR CHEST PORTABLE CLINICAL HISTORY: SHORTNESS OF BREATH COMPARISONS: June 08, 2018 FINDINGS: Osseous structures intact. Cardiopericardial silhouette normal. Pulmonary vasculature normal. Lungs clear.     NO ACUTE CARDIOPULMONARY DISEASE.    Ir Picc Wo Sq Port/pump > 5 Years    Result Date: 08/05/2018  PERIPHERALLY INSERTED CENTRAL CATHETER (PICC) PLACEMENT WITH ULTRASOUND GUIDANCE CLINICAL HISTORY:  COARSE PROLONGED INTRAVENOUS ANTIBIOTICS After discussing the procedure and possible complications with the patient, informed consent was obtained.  The patient was placed on the Special Procedures table.  The left upper extremity was  sterilely prepared using a maximal sterile barrier technique  which includes cap, mask, sterile gown, sterile gloves, sterile full-body drape, hand hygiene and 2% chlorhexidine for cutaneous antisepsis. A sterile ultrasound technique with sterile gel and sterile probe covers was also utilized. A pre-procedure time out was performed in order to assure the correct patient and procedure.  Local anesthetic was administered.   A peripheral vein was accessed with sonographic  guidance.  A sonographic spot image was obtained for documentation.  A guidewire was advanced into the vein with fluoroscopic guidance and a sheath was placed over the guidewire.  A 5-French triple-lumen PICC was advanced through the sheath, up the arm and into the central vasculature.  It was positioned appropriately.  The  sheath was removed.  The catheter was shown to aspirate and infuse properly.  The flange of the catheter was affixed to the arm using a PICC securement device.  A spot image of the chest showed the tip of the PICC line to lie in the superior vena cava.   The patient tolerated the procedure well and without complications.  Number of films: 4 Fluoroscopy time: 16.8 seconds CONCLUSION: SUCCESSFUL LEFT PICC PLACEMENT WITHOUT IMMEDIATE COMPLICATIONS.     Ir US Guided Paracentesis    1.  Status post technically successful ultrasound-guided paracentesis.  CLINICAL HISTORY:Maico TONKS is a Male of 49 years age, referred for Ultrasound Guided Paracentesis.  PROCEDURE: Survey of the abdomen showed large amount of ascites fluid. After obtaining informed consent, the patient was positioned supine on the sonography table. Using ultrasound, the skin over the left hemiabdomen was locally anesthetized with 1% lidocaine.  Following that, a Yueh needle was advanced into the fluid pocket using ultrasound visualization.  12,400cc, of clear yellow fluid were aspirated and sent for cytology, and pathology.  The needle was removed, and hemostasis was obtained with pressure.  A Band-Aid was placed.  Post procedure images did not demonstrate hemorrhage at the target site. The patient tolerated the procedure well. The patient left the department in good condition. A radiology nurse was in presence monitoring vital signs, assisting throughout the procedure.         ASSESSMENT & PLAN  Active Hospital Problems    Diagnosis Date Noted   . SBP (spontaneous bacterial peritonitis) (HCC) [K65.2] 08/06/2018   . Esophagitis, [place] grade C [K20.8] 08/06/2018   . Esophageal ulceration [K22.10] 08/06/2018   . Upper GI bleed [K92.2] 08/06/2018   . Acute kidney injury (HCC) [N17.9] 08/06/2018   . Hyponatremia [E87.1] 08/06/2018   . Abnormal thyroid function test [R94.6] 08/06/2018   . Chronic hepatitis C without hepatic coma (HCC) [B18.2] 08/06/2018   . Chronic viral hepatitis B without delta agent and without coma (HCC) [B18.1] 08/06/2018   . Acute blood loss anemia [D62] 08/04/2018   . Abdominal pain [R10.9] 06/08/2018   . Ascites of liver [R18.8] 06/08/2018   . Cirrhosis [facility]) [K74.60] 06/07/2018       48 years old male, known history of hepatitis C, chronic liver cirrhosis, several admission due to sepsis, anemia and bleeding, receiving paracentesis regularly as an outpatient.  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  He also has history of chronic pain including the neck back and all over his joints in addition also a lot of  abdominal pain.  Patient has very poor guarded medical condition, poor prognosis , Noncompliance is an issue.    I will only provide him a short course of Dilaudid to provide some comfort care until able to be transferred to another tertiary facility to continue care.  However I do not recommend long-term opioid due to concern in his history as noted above and noncompliance history.    RECOMMENDATION:  SEE ORDERS    Due to his severe intractable pain we will provide him with 1 mg Dilaudid now to calm him down as well as giving him another dose of Dilaudid prior to transfer him to [place] clinic to prevent him some relief during the trip to [place] clinic.    Maintain on non-opioid therapy for the most part which might be very limited due to his limitation was not able to take nonsteroidal  anti-inflammatories recent decline kidney function, also not able to take decline in his liver disease.    SIGNATURE: Earna Coder, MD PATIENT NAME: Travis Ortega   DATE: August 06, 2018 MRN: [id number]   TIME: 6:46 PM PAGER/CONTACT #: [phone]

## 2018-08-06 NOTE — Progress Notes (Addendum)
Physician Progress Note    08/06/2018   12:36 PM    Name:  Travis Ortega  MRN:    [id number]     IP Day: 2     Admit Date: 08/04/2018 12:24 PM  PCP: Yaakov Guthriehristopher M Broderick, APRN - CNP    Code Status:  Full Code    Subjective:     Reviewed endoscopy findings with patient.  Last night he did have large dark tarry stool.  He reports persistent abdominal pain but otherwise denies chest pain, dyspnea, nausea.    Current Facility-Administered Medications   Medication Dose Route Frequency Provider Last Rate Last Dose   ??? 0.9 % sodium chloride infusion   Intravenous Continuous Nicholas K Niemiec, DO       ??? sodium chloride flush 0.9 % injection 10 mL  10 mL Intravenous 2 times per day FedExicholas K Niemiec, DO   10 mL at 08/06/18 0815   ??? sodium chloride flush 0.9 % injection 10 mL  10 mL Intravenous PRN Nicholas K Niemiec, DO       ??? lidocaine PF 1 % injection 1 mL  1 mL Intradermal Once PRN FedExicholas K Niemiec, DO       ??? sodium chloride flush 0.9 % injection 10 mL  10 mL Intravenous 2 times per day Lowry BowlNeal R Kenyana Husak, DO   10 mL at 08/05/18 0800   ??? sodium chloride flush 0.9 % injection 10 mL  10 mL Intravenous PRN Lowry BowlNeal R Chatara Lucente, DO       ??? sodium chloride flush 0.9 % injection 10 mL  10 mL Intravenous 2 times per day Lowry BowlNeal R Kenson Groh, DO       ??? sodium chloride flush 0.9 % injection 10 mL  10 mL Intravenous PRN Lowry BowlNeal R Lakeshia Dohner, DO       ??? 0.9 % sodium chloride infusion   Intravenous Continuous Adedeji Sodeinde, MD 75 mL/hr at 08/06/18 1139     ??? octreotide (SANDOSTATIN) 500 mcg in sodium chloride 0.9 % 100 mL infusion  50 mcg/hr Intravenous Continuous Lowry BowlNeal R Laurita Peron, DO 10 mL/hr at 08/06/18 0941 50 mcg/hr at 08/06/18 0941   ??? 0.9 % sodium chloride bolus  20 mL Intravenous Once Lowry BowlNeal R Anai Lipson, DO       ??? 0.9 % sodium chloride bolus  1,000 mL Intravenous Once Lowry BowlNeal R Yuritzi Kamp, DO       ??? sodium chloride flush 0.9 % injection 10 mL  10 mL Intravenous 2 times per day Clayborne ArtistJohn Michael Dobritch, PA   10 mL at 08/05/18 0748   ??? sodium  chloride flush 0.9 % injection 10 mL  10 mL Intravenous PRN Clayborne ArtistJohn Michael Dobritch, PA       ??? polyethylene glycol (GLYCOLAX) packet 17 g  17 g Oral Daily PRN Clayborne ArtistJohn Michael Dobritch, PA       ??? promethazine (PHENERGAN) tablet 12.5 mg  12.5 mg Oral Q6H PRN Clayborne ArtistJohn Michael Dobritch, PA        Or   ??? ondansetron [facility](ZOFRAN) injection 4 mg  4 mg Intravenous Q6H PRN Clayborne ArtistJohn Michael Dobritch, PA       ??? pantoprazole (PROTONIX) injection 40 mg  40 mg Intravenous BID Clayborne ArtistJohn Michael Dobritch, PA   40 mg at 08/06/18 [id number]    And   ??? sodium chloride (PF) 0.9 % injection 10 mL  10 mL Intravenous Daily Clayborne ArtistJohn Michael Dobritch, PA   10 mL at 08/06/18 0815   ??? nicotine (NICODERM CQ) 7 MG/24HR 1  patch  1 patch Transdermal Daily Earleen Reaper, MD   1 patch at 08/06/18 0814   ??? ciprofloxacin (CIPRO) IVPB 400 mg  400 mg Intravenous Q12H Lowry Bowl, DO   Stopped at 08/06/18 1610   ??? oxyCODONE (ROXICODONE) immediate release tablet 5 mg  5 mg Oral Q4H PRN Lowry Bowl, DO   5 mg at 08/06/18 0945   ??? albumin human 25 % IV solution 25 g  25 g Intravenous Q8H Earleen Reaper, MD   Stopped at 08/06/18 0914   ??? melatonin tablet 5 mg  5 mg Oral Nightly PRN Dutch Quint, APRN - CNP   5 mg at 08/05/18 2113       Physical Examination:      Vitals:  BP (!) 117/56    Pulse 75    Temp 97.9 ??F (36.6 ??C) (Oral)    Resp 16    Ht  (1.753 m)    Wt 245 lb 9.5 oz (111.4 kg)    SpO2 100%    BMI 36.27 kg/m??   Temp (24hrs), Avg:98.2 ??F (36.8 ??C), Min:97.9 ??F (36.6 ??C), Max:99.5 ??F (37.5 ??C)    General appearance: alert, cooperative and no distress  Mental Status: oriented to person, place and time and normal affect.  No asterixis  Lungs: clear to auscultation bilaterally, normal effort  Heart: regular rate and rhythm, no murmur  Abdomen: Distended.  Diffusely tender to palpation with rebound tenderness.  No guarding, rigidity  Extremities: 1+ pitting edema bilaterally in distal lower extremity  Skin: no gross lesions, rashes    Data:     Labs:  Recent Labs      08/05/18  1017  08/05/18  2306 08/06/18  0559   WBC 4.7*  --   --  9.0   HGB 5.9*   < > 7.1* 6.7*   PLT 86*  --   --  88*    < > = values in this interval not displayed.     Recent Labs     08/05/18  0830 08/05/18  1546 08/06/18  0600   NA 122* 124* 126*   K 4.0  --  3.9   CL 90*  --  93*   CO2 23  --  22   BUN 54*  --  39*   CREATININE 1.26*  --  1.20   GLUCOSE 92  --  153*     Recent Labs     08/05/18  0830 08/06/18  0600   AST 144* 81*   ALT 79* 58*   BILITOT 5.3* 5.7*   ALKPHOS 181* 145*       Assessment and Plan:        48 year old male with history of cirrhosis (Hep B/C??from tattoos, and h/o heavy etOH use), hospitalization Feb 2020 for 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 He admitted to several days of dark red emesis and stool.      1. Acute Blood Loss Anemia secondary to upper GI bleed. EGD revealed [place] grade C esophagitis with multiple clean based ulcerations. Also small non-bleeding EV.   - transfuse to Hb>7  - octreotide gtt + IV PPI x 72h total  - empiric cipro (Pen allergy). R/o SBP with paracentesis   - nadolol as o/p   ??  2. AKI due to above: improving  - nephrology managing. Still on albumin challenge  ??  3. Hyponatremia suspect hypervolemic due to cirrhosis  -  with AKI / blood loss will hold diuretics  - other management per nephrology  ??  4. Metabolic encephalopathy: tx above. Lethargy improved and mental status at baseline. Eventually resume lactulose   - UDS was not collected though ordered on admission.    5. Decompensated Cirrhosis  - will establish with hepatology at transplant center  ??  6. 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  7. Chronic Pain:   - pain management consulted   - ok for ~5L removal with paracentesis    8. 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 PT/OT to  assess.    Electronically signed by Lowry Bowl, DO on 08/06/2018 at 12:36 PM

## 2018-08-06 NOTE — Consults (Signed)
[facility]                      [address] [place], [place] [address]                                  CONSULTATION    PATIENT NAME: Travis Ortega, Travis Ortega                      DOB:        1970/06/28  MED REC NO:   [id number]                            ROOM:       [id number]  ACCOUNT NO:   [id number]                           ADMIT DATE: 08/04/2018  PROVIDER:     Kris Hartmann, MD    CONSULT DATE:  08/06/2018    ENDOCRINE CONSULT    REFERRING PROVIDER:  Mat Carne, DO    REASON FOR CONSULT:  Abnormal thyroid function test, hyperthyroidism.    CHIEF COMPLAINT AND HISTORY OF PRESENT ILLNESS:  The patient is a  48 year old male with known history of cirrhosis and ascites presenting  with fatigue.  The patient denies any significant weight loss.  He has  had alcoholic cirrhosis with ascites for a while, admitted with low  blood pressure of 90 systolic.  Labs done show TSH of 0.273, free T4 was  0.65, which was done this afternoon.  Baseline thyroid function test  shows TSH of 0.3, free T4 was 0.69, free T3 was 1.5.  Chemistries were  reviewed; sodium was 126, potassium 3.9, chloride 93, CO2 was 22, BUN  was 39, creatinine was 1.20.  CBC; WBC count was 9, hemoglobin was 6.7.   The patient is also going to be transferred to [facility] for  further management of his severe ascites and other comorbid condition.   Nephrology was also consulted for hyponatremia.  The patient denies any  prior history of hypo or hyperthyroidism, any significant weight loss,  goiter, or neck pain.    PAST MEDICAL HISTORY:  Significant for drug abuse, cirrhosis, hepatitis  C, hepatitis B, ascites.    PAST SURGICAL HISTORY:  Endoscopy, paracentesis multiple times.    FAMILY HISTORY:  Cancer.    PERSONAL AND SOCIAL HISTORY:  Currently he does smoke cigarettes.  He  denies any alcohol or substance abuse at this time.    MEDICATIONS:  Meds here include normal saline, Nicoderm, Protonix,  octreotide.    ALLERGIES:  Include  NUBAIN, PENICILLIN, MORPHINE, TYLENOL.    REVIEW OF SYSTEMS:  Other than fatigue, swelling of his abdomen,  weakness, 14-point review of systems was negative.    PHYSICAL EXAMINATION:  GENERAL:  The patient appears very ill, lying in bed.  VITAL SIGNS:  Blood pressure was 129/78, pulse rate was 65, respiratory  rate was 16, temperature 97.7.  HEENT:  Normocephalic, atraumatic.  No jaundice was noted.  Pupils were  equal and reactive.  Oral mucosa was dry.  NECK:  Supple.  Trachea in the midline.  CHEST:  Lungs were clear to auscultation bilaterally.  No wheezing or  crackles were heard.  CARDIOVASCULAR:  Heart sounds were normal.  No  murmurs or thrills were  present.  ABDOMEN:  Soft, distended.  Tenderness noted over lower abdominal area  with no rebounding.  EXTREMITIES:  Lower extremities reveal 1+ pitting edema.  Chronic  changes in both lower legs.  SKIN:  Appears to be pale and dry.  MUSCULOSKELETAL:  No joint swelling.  NEUROLOGIC:  Unable to cooperate.  PSYCHIATRIC:  Appears depressed.    LABORATORY DATA:  As above.    ASSESSMENT:  Slightly suppressed TSH as well as lower free T4 more than  likely due to euthyroid sick syndrome, it does not indicate any hypo or  hyperthyroidism.  Ascites with cirrhosis, hyponatremia, hepatic  encephalopathy.  Prognosis appears to be guarded to poor.    PLAN:  Agree we transfer patient to [facility].  No further  intervention as far as Endocrine is concerned.  Ordered thyroid  antibodies, the results will be available for followup.  Okay to  discharge patient from Endocrine service.    Thank you for the consult.        Kris Hartmann, MD    D: 08/06/2018 20:01:45       T: 08/06/2018 22:14:22     [email or web address]  Job#: [id number]     Doc#: [id number]    CC:

## 2018-08-07 LAB — PREPARE RBC (CROSSMATCH): Dispense Status Blood Bank: TRANSFUSED

## 2018-08-07 MED FILL — PROTONIX 40 MG IV SOLR: 40 MG | INTRAVENOUS | Qty: 40

## 2018-08-09 LAB — HEPATITIS C RNA, QUANTITATIVE, PCR
HCV QNT by NAAT IU/ML: NOT DETECTED IU/mL
HCV Qnt by NAAT log IU/ml: NOT DETECTED log IU/mL
Interpretation: NOT DETECTED

## 2018-08-09 LAB — HEPATITIS B, QUANTITATIVE, MOLECULAR
HBV Quantitative, IU/ML: 9069 IU/mL
HBV Quantitative, log IU/ML: 3.96 log IU/mL
Interpretation: DETECTED — AB

## 2018-08-09 LAB — THYROID STIMULATING IMMUNOGLOBULIN: TSI: <0.1 IU/L (ref <=0.54)

## 2018-08-09 LAB — CULTURE, BODY FLUID: Body Fluid Culture, Sterile: NO GROWTH

## 2018-08-11 LAB — THYROID PEROXIDASE ANTIBODY: Thyroid Peroxidase (TPO) Abs: 17.7 IU/mL (ref 0.0–35.0)

## 2018-08-26 MED ORDER — LIDOCAINE HCL (CARDIAC) PF 100 MG/5ML IV SOLN
100 | INTRAVENOUS | Status: AC
Start: 2018-08-26 — End: 2018-08-26

## 2018-08-26 MED ORDER — ROCURONIUM BROMIDE 50 MG/5ML IV SOLN
50 | INTRAVENOUS | Status: AC
Start: 2018-08-26 — End: 2018-08-26

## 2018-08-26 MED ORDER — SODIUM CHLORIDE BACTERIOSTATIC 0.9 % IJ SOLN
0.9 | INTRAMUSCULAR | Status: AC
Start: 2018-08-26 — End: 2018-08-26

## 2018-08-26 MED ORDER — PHENYLEPHRINE HCL (PRESSORS) 10 MG/ML IV SOLN
10 | INTRAVENOUS | Status: AC
Start: 2018-08-26 — End: 2018-08-26

## 2018-08-26 MED ORDER — PROPOFOL 500 MG/50ML IV EMUL
500 | INTRAVENOUS | Status: AC
Start: 2018-08-26 — End: 2018-08-26

## 2018-08-26 MED FILL — SODIUM CHLORIDE BACTERIOSTATIC 0.9 % IJ SOLN: 0.9 % | INTRAMUSCULAR | Qty: 30

## 2018-08-26 MED FILL — ROCURONIUM BROMIDE 50 MG/5ML IV SOLN: 50 MG/5ML | INTRAVENOUS | Qty: 5

## 2018-08-26 MED FILL — DIPRIVAN 500 MG/50ML IV EMUL: 500 MG/50ML | INTRAVENOUS | Qty: 50

## 2018-08-26 MED FILL — VAZCULEP 10 MG/ML IV SOLN: 10 MG/ML | INTRAVENOUS | Qty: 1

## 2018-08-26 MED FILL — LIDOCAINE HCL (CARDIAC) PF 100 MG/5ML IV SOLN: 100 MG/5ML | INTRAVENOUS | Qty: 5
